# Patient Record
Sex: Female | Born: 1955
Health system: Southern US, Community
[De-identification: ages and names within clinical notes are randomized; demographics above are authoritative.]

## PROBLEM LIST (undated history)

## (undated) DIAGNOSIS — Z9889 Other specified postprocedural states: Secondary | ICD-10-CM

## (undated) DIAGNOSIS — G43909 Migraine, unspecified, not intractable, without status migrainosus: Secondary | ICD-10-CM

## (undated) DIAGNOSIS — I10 Essential (primary) hypertension: Secondary | ICD-10-CM

## (undated) DIAGNOSIS — R112 Nausea with vomiting, unspecified: Secondary | ICD-10-CM

## (undated) DIAGNOSIS — T8859XA Other complications of anesthesia, initial encounter: Secondary | ICD-10-CM

## (undated) HISTORY — PX: TOE SURGERY: SHX1073

---

## 2012-12-04 DIAGNOSIS — H02429 Myogenic ptosis of unspecified eyelid: Secondary | ICD-10-CM | POA: Insufficient documentation

## 2012-12-04 DIAGNOSIS — L719 Rosacea, unspecified: Secondary | ICD-10-CM | POA: Insufficient documentation

## 2012-12-04 DIAGNOSIS — H5340 Unspecified visual field defects: Secondary | ICD-10-CM | POA: Insufficient documentation

## 2012-12-04 DIAGNOSIS — L908 Other atrophic disorders of skin: Secondary | ICD-10-CM | POA: Insufficient documentation

## 2012-12-04 DIAGNOSIS — L988 Other specified disorders of the skin and subcutaneous tissue: Secondary | ICD-10-CM | POA: Insufficient documentation

## 2016-06-13 DIAGNOSIS — G43909 Migraine, unspecified, not intractable, without status migrainosus: Secondary | ICD-10-CM | POA: Insufficient documentation

## 2017-06-21 DIAGNOSIS — N952 Postmenopausal atrophic vaginitis: Secondary | ICD-10-CM | POA: Insufficient documentation

## 2018-07-16 MED FILL — SPIRONOLACTONE 100 MG TABS: 100 | 90 days supply | Qty: 90 | Fill #0

## 2018-07-16 MED FILL — METHYLPHENIDATE 10 MG TAB: 10 | 30 days supply | Qty: 90 | Fill #0

## 2018-07-16 MED FILL — SUMATRIPTAN SUCC 100 MG TAB: 100 | 30 days supply | Qty: 9 | Fill #0

## 2018-07-22 MED FILL — DESVENLAFAXINE SUC ER 50 MG: 50 | 60 days supply | Qty: 60 | Fill #0

## 2018-07-22 MED FILL — NORETHIN-ETH ESTRAD 1 MG-5: 1-5 | 84 days supply | Qty: 84 | Fill #0

## 2018-08-14 MED FILL — METHYLPHENIDATE 10 MG TAB: 10 | 30 days supply | Qty: 90 | Fill #0

## 2018-08-14 MED FILL — SUMATRIPTAN SUCC 100 MG TAB: 100 | 30 days supply | Qty: 9 | Fill #1

## 2018-08-27 MED FILL — AMLODIPINE BESYLATE 5 MG TA: 5 | 73 days supply | Qty: 73 | Fill #0

## 2018-09-17 MED FILL — METHYLPHENIDATE 10 MG TAB: 10 | 30 days supply | Qty: 90 | Fill #0

## 2018-09-17 MED FILL — SUMATRIPTAN SUCC 100 MG TAB: 100 | 30 days supply | Qty: 9 | Fill #2

## 2018-09-19 MED FILL — DESVENLAFAXINE SUC ER 50 MG: 50 | 13 days supply | Qty: 13 | Fill #1

## 2018-10-10 DIAGNOSIS — Z0000 Encounter for general adult medical examination without abnormal findings: Secondary | ICD-10-CM | POA: Diagnosis not present

## 2018-10-15 MED FILL — METHYLPHENIDATE 10 MG TAB: 10 | 30 days supply | Qty: 90 | Fill #0

## 2018-10-15 MED FILL — SPIRONOLACTONE 100 MG TABS: 100 | 90 days supply | Qty: 90 | Fill #1

## 2018-10-15 MED FILL — NORETHIN-ETH ESTRAD 1 MG-5: 1-5 | 84 days supply | Qty: 84 | Fill #1

## 2018-10-15 MED FILL — SUMATRIPTAN SUCC 100 MG TAB: 100 | 30 days supply | Qty: 9 | Fill #0

## 2018-10-19 MED FILL — DESVENLAFAXINE SUC ER 50 MG: 50 | 90 days supply | Qty: 90 | Fill #0

## 2018-11-17 MED FILL — AMLODIPINE BESYLATE 5 MG TA: 5 | 73 days supply | Qty: 73 | Fill #0

## 2018-11-17 MED FILL — METHYLPHENIDATE 10 MG TAB: 10 | 30 days supply | Qty: 90 | Fill #0

## 2018-11-17 MED FILL — SUMATRIPTAN SUCC 100 MG TAB: 100 | 20 days supply | Qty: 6 | Fill #1

## 2018-12-04 MED FILL — SUMAtriptan SUCCINATE 100 M: 100 | 90 days supply | Qty: 24 | Fill #0

## 2018-12-12 DIAGNOSIS — Z1231 Encounter for screening mammogram for malignant neoplasm of breast: Secondary | ICD-10-CM | POA: Diagnosis not present

## 2018-12-16 MED FILL — METHYLPHENIDATE 10 MG TAB: 10 | 30 days supply | Qty: 90 | Fill #0

## 2019-01-05 MED FILL — NORETHIN-ETH ESTRAD 1 MG-5: 1-5 | 84 days supply | Qty: 84 | Fill #2

## 2019-01-14 MED FILL — SPIRONOLACTONE 100 MG TAB: 100 | 90 days supply | Qty: 90 | Fill #2

## 2019-01-14 MED FILL — METHYLPHENIDATE 10 MG TAB: 10 | 30 days supply | Qty: 90 | Fill #0

## 2019-01-14 MED FILL — DESVENLAFAXINE SUC ER 50 MG: 50 | 90 days supply | Qty: 90 | Fill #0

## 2019-01-23 ENCOUNTER — Ambulatory Visit: Payer: 59 | Admitting: Podiatry

## 2019-01-23 ENCOUNTER — Encounter: Payer: Self-pay | Admitting: Podiatry

## 2019-01-23 ENCOUNTER — Ambulatory Visit (INDEPENDENT_AMBULATORY_CARE_PROVIDER_SITE_OTHER): Payer: 59

## 2019-01-23 ENCOUNTER — Other Ambulatory Visit: Payer: Self-pay | Admitting: Podiatry

## 2019-01-23 VITALS — BP 149/66

## 2019-01-23 DIAGNOSIS — I7300 Raynaud's syndrome without gangrene: Secondary | ICD-10-CM | POA: Diagnosis not present

## 2019-01-23 DIAGNOSIS — M79671 Pain in right foot: Secondary | ICD-10-CM

## 2019-01-23 DIAGNOSIS — H524 Presbyopia: Secondary | ICD-10-CM | POA: Diagnosis not present

## 2019-01-28 NOTE — Progress Notes (Signed)
Subjective:   Patient ID: Deborah York, female   DOB: 63 y.o.   MRN: [id number]   HPI Patient presents with a lot of pain of her right third toe and states is been relatively recently.  States the toe did turn quite red and that it was irritated and is not as bad now but still sore.  Patient does not smoke and likes to be active   Review of Systems  All other systems reviewed and are negative.       Objective:  Physical Exam Vitals signs and nursing note reviewed.  Constitutional:      Appearance: She is well-developed.  Pulmonary:     Effort: Pulmonary effort is normal.  Musculoskeletal: Normal range of motion.  Skin:    General: Skin is warm.  Neurological:     Mental Status: She is alert.     Neurovascular status intact muscle strength is adequate range of motion within normal limits with patient found to have discoloration and irritation distal third digit right that is localized with no breakdown of tissue noted currently there is mild coolness to the digits but no increased coolness in this digit over other digits and patient did have good Flow and was noted to be well oriented x3     Assessment:  Probability for ray nods phenomena right with probability for trauma and low-grade ischemia of the skin itself     Plan:  H&P and explained the condition to the patient and at this point I have recommended conservative protection for the area along with gradual warming of the affected area.  This should be uneventful but I did give strict instructions if any changes were to occur to let us know immediately

## 2019-02-12 MED FILL — AMLODIPINE BESYLATE 5 MG TA: 5 | 90 days supply | Qty: 90 | Fill #0

## 2019-02-12 MED FILL — METHYLPHENIDATE 10 MG TAB: 10 | 30 days supply | Qty: 90 | Fill #0

## 2019-03-02 MED FILL — SUMAtriptan SUCCINATE 100 M: 100 | 90 days supply | Qty: 24 | Fill #1

## 2019-03-18 MED FILL — METHYLPHENIDATE 10 MG TAB: 10 | 30 days supply | Qty: 90 | Fill #0

## 2019-03-18 MED FILL — NORETHIN-ETH ESTRAD 1 MG-5: 1-5 | 84 days supply | Qty: 84 | Fill #3

## 2019-04-17 MED FILL — DESVENLAFAXINE SUC ER 50 MG: 50 | 90 days supply | Qty: 90 | Fill #0

## 2019-04-17 MED FILL — METHYLPHENIDATE 10 MG TAB: 10 | 30 days supply | Qty: 90 | Fill #0

## 2019-04-17 MED FILL — SPIRONOLACTONE 100 MG TAB: 100 | 90 days supply | Qty: 90 | Fill #0

## 2019-05-18 MED FILL — AMLODIPINE BESYLATE 5 MG TA: 5 | 90 days supply | Qty: 90 | Fill #1

## 2019-05-18 MED FILL — METHYLPHENIDATE 10 MG TAB: 10 | 30 days supply | Qty: 90 | Fill #0

## 2019-05-29 MED FILL — SM BLOOD PRESSURE MONITOR: 30 days supply | Qty: 1 | Fill #0

## 2019-06-02 MED FILL — SUMATRIPTAN SUCC 100 MG TAB: 100 | 80 days supply | Qty: 24 | Fill #0

## 2019-06-17 MED FILL — NORETHIN-ETH ESTRAD 1 MG-5: 1-5 | 84 days supply | Qty: 84 | Fill #0

## 2019-06-18 MED FILL — METHYLPHENIDATE 10 MG TAB: 10 | 30 days supply | Qty: 90 | Fill #0

## 2019-07-16 MED FILL — DESVENLAFAXINE SUC ER 50 MG: 50 | 90 days supply | Qty: 90 | Fill #0

## 2019-07-16 MED FILL — SPIRONOLACTONE 100 MG TAB: 100 | 90 days supply | Qty: 90 | Fill #1

## 2019-07-22 DIAGNOSIS — L923 Foreign body granuloma of the skin and subcutaneous tissue: Secondary | ICD-10-CM | POA: Diagnosis not present

## 2019-07-24 MED FILL — METHYLPHENIDATE 10 MG TAB: 10 | 30 days supply | Qty: 90 | Fill #0

## 2019-07-29 DIAGNOSIS — R238 Other skin changes: Secondary | ICD-10-CM | POA: Diagnosis not present

## 2019-07-29 DIAGNOSIS — L814 Other melanin hyperpigmentation: Secondary | ICD-10-CM | POA: Diagnosis not present

## 2019-07-29 DIAGNOSIS — D225 Melanocytic nevi of trunk: Secondary | ICD-10-CM | POA: Diagnosis not present

## 2019-07-29 DIAGNOSIS — L821 Other seborrheic keratosis: Secondary | ICD-10-CM | POA: Diagnosis not present

## 2019-07-29 DIAGNOSIS — D229 Melanocytic nevi, unspecified: Secondary | ICD-10-CM | POA: Diagnosis not present

## 2019-07-29 DIAGNOSIS — L819 Disorder of pigmentation, unspecified: Secondary | ICD-10-CM | POA: Diagnosis not present

## 2019-07-29 DIAGNOSIS — L570 Actinic keratosis: Secondary | ICD-10-CM | POA: Diagnosis not present

## 2019-07-29 DIAGNOSIS — D485 Neoplasm of uncertain behavior of skin: Secondary | ICD-10-CM | POA: Diagnosis not present

## 2019-07-29 DIAGNOSIS — D1801 Hemangioma of skin and subcutaneous tissue: Secondary | ICD-10-CM | POA: Diagnosis not present

## 2019-08-14 DIAGNOSIS — F439 Reaction to severe stress, unspecified: Secondary | ICD-10-CM | POA: Diagnosis not present

## 2019-08-14 DIAGNOSIS — R195 Other fecal abnormalities: Secondary | ICD-10-CM | POA: Diagnosis not present

## 2019-08-14 MED FILL — METRONIDAZOLE 500 MG TABS: 500 | 14 days supply | Qty: 42 | Fill #0

## 2019-08-20 MED FILL — AMLODIPINE BESYLATE 5 MG TA: 5 | 90 days supply | Qty: 90 | Fill #0

## 2019-08-20 MED FILL — METHYLPHENIDATE 10 MG TAB: 10 | 30 days supply | Qty: 90 | Fill #0

## 2019-08-21 DIAGNOSIS — F4323 Adjustment disorder with mixed anxiety and depressed mood: Secondary | ICD-10-CM | POA: Diagnosis not present

## 2019-09-02 MED FILL — SUMATRIPTAN SUCC 100 MG TAB: 100 | 80 days supply | Qty: 24 | Fill #1

## 2019-09-04 DIAGNOSIS — F4323 Adjustment disorder with mixed anxiety and depressed mood: Secondary | ICD-10-CM | POA: Diagnosis not present

## 2019-09-18 DIAGNOSIS — F4323 Adjustment disorder with mixed anxiety and depressed mood: Secondary | ICD-10-CM | POA: Diagnosis not present

## 2019-09-18 MED FILL — METHYLPHENIDATE 10 MG TAB: 10 | 30 days supply | Qty: 90 | Fill #0

## 2019-10-01 DIAGNOSIS — R195 Other fecal abnormalities: Secondary | ICD-10-CM | POA: Diagnosis not present

## 2019-10-02 DIAGNOSIS — F4323 Adjustment disorder with mixed anxiety and depressed mood: Secondary | ICD-10-CM | POA: Diagnosis not present

## 2019-10-09 DIAGNOSIS — K449 Diaphragmatic hernia without obstruction or gangrene: Secondary | ICD-10-CM | POA: Diagnosis not present

## 2019-10-09 DIAGNOSIS — R194 Change in bowel habit: Secondary | ICD-10-CM | POA: Diagnosis not present

## 2019-10-09 DIAGNOSIS — R197 Diarrhea, unspecified: Secondary | ICD-10-CM | POA: Diagnosis not present

## 2019-10-12 MED FILL — NORETHIN-ETH ESTRAD 1 MG-5: 1-5 | 84 days supply | Qty: 84 | Fill #1

## 2019-10-19 DIAGNOSIS — R197 Diarrhea, unspecified: Secondary | ICD-10-CM | POA: Diagnosis not present

## 2019-10-23 DIAGNOSIS — Z23 Encounter for immunization: Secondary | ICD-10-CM | POA: Diagnosis not present

## 2019-10-23 DIAGNOSIS — Z0000 Encounter for general adult medical examination without abnormal findings: Secondary | ICD-10-CM | POA: Diagnosis not present

## 2019-10-23 DIAGNOSIS — F4323 Adjustment disorder with mixed anxiety and depressed mood: Secondary | ICD-10-CM | POA: Diagnosis not present

## 2019-10-23 MED FILL — GABAPENTIN 600 MG TABLET: 600 | 90 days supply | Qty: 90 | Fill #0

## 2019-11-18 DIAGNOSIS — Z23 Encounter for immunization: Secondary | ICD-10-CM | POA: Diagnosis not present

## 2019-11-18 MED FILL — METHYLPHENIDATE HCL 10 MG T: 10 | 30 days supply | Qty: 90 | Fill #0

## 2019-11-27 DIAGNOSIS — F4323 Adjustment disorder with mixed anxiety and depressed mood: Secondary | ICD-10-CM | POA: Diagnosis not present

## 2019-12-02 MED FILL — SUMATRIPTAN SUCC 100 MG TAB: 100 | 90 days supply | Qty: 24 | Fill #0

## 2019-12-04 DIAGNOSIS — R198 Other specified symptoms and signs involving the digestive system and abdomen: Secondary | ICD-10-CM | POA: Diagnosis not present

## 2019-12-04 DIAGNOSIS — K581 Irritable bowel syndrome with constipation: Secondary | ICD-10-CM | POA: Diagnosis not present

## 2019-12-04 DIAGNOSIS — K449 Diaphragmatic hernia without obstruction or gangrene: Secondary | ICD-10-CM | POA: Diagnosis not present

## 2019-12-04 MED FILL — LINZESS 145 MCG CAPSULE: 145 | 90 days supply | Qty: 90 | Fill #0

## 2019-12-17 MED FILL — METHYLPHENIDATE HCL 10 MG T: 10 | 30 days supply | Qty: 90 | Fill #0

## 2019-12-18 DIAGNOSIS — F4323 Adjustment disorder with mixed anxiety and depressed mood: Secondary | ICD-10-CM | POA: Diagnosis not present

## 2020-01-01 DIAGNOSIS — F4323 Adjustment disorder with mixed anxiety and depressed mood: Secondary | ICD-10-CM | POA: Diagnosis not present

## 2020-01-08 ENCOUNTER — Other Ambulatory Visit: Payer: Self-pay

## 2020-01-08 ENCOUNTER — Ambulatory Visit: Payer: 59 | Admitting: Podiatry

## 2020-01-08 ENCOUNTER — Ambulatory Visit (INDEPENDENT_AMBULATORY_CARE_PROVIDER_SITE_OTHER): Payer: 59

## 2020-01-08 VITALS — Temp 97.2°F

## 2020-01-08 DIAGNOSIS — G5762 Lesion of plantar nerve, left lower limb: Secondary | ICD-10-CM | POA: Diagnosis not present

## 2020-01-08 DIAGNOSIS — G5763 Lesion of plantar nerve, bilateral lower limbs: Secondary | ICD-10-CM

## 2020-01-08 DIAGNOSIS — D3610 Benign neoplasm of peripheral nerves and autonomic nervous system, unspecified: Secondary | ICD-10-CM

## 2020-01-11 MED FILL — NORETHIN-ETH ESTRAD 1 MG-5: 1-5 | 84 days supply | Qty: 84 | Fill #2

## 2020-01-14 MED FILL — DESVENLAFAXINE SUC ER 50 MG: 50 | 90 days supply | Qty: 90 | Fill #0

## 2020-01-14 MED FILL — SPIRONOLACTONE 100 MG TAB: 100 | 90 days supply | Qty: 90 | Fill #0

## 2020-01-15 DIAGNOSIS — F4323 Adjustment disorder with mixed anxiety and depressed mood: Secondary | ICD-10-CM | POA: Diagnosis not present

## 2020-01-15 MED FILL — METHYLPHENIDATE 10 MG TAB: 10 | 30 days supply | Qty: 90 | Fill #0

## 2020-01-26 NOTE — Progress Notes (Signed)
  Subjective:  Patient ID: Deborah York, female    DOB: 24-Jan-1956,  MRN: [id number]  Chief Complaint  Patient presents with  . Neuroma    L ft, 4th toe. x18-20 months. Pt stated, "I saw a doctor at [facility] for this issue, and he diagnosed me with a Morton's neuroma. I have 2/10 pain while walking, and 5/10 shooting pains. I received injections at [facility], and they helped for awhile. I also apply padding [plantar forefoot submet 4".    64 y.o. female presents with the above complaint. History confirmed with patient.   Objective:  Physical Exam: warm, good capillary refill, no trophic changes or ulcerative lesions, normal DP and PT pulses and normal sensory exam. Left Foot: tenderness between the 3rd and 4th metatarsal head Mulder's click noted Right Foot: tenderness between the 3rd and 4th metatarsal head Mulder's click noted.  No images are attached to the encounter.  Radiographs: X-ray of the left foot: no fracture, dislocation, swelling or degenerative changes noted   Assessment:   1. Morton's neuroma of both feet    Plan:  Patient was evaluated and treated and all questions answered.  Morton Neuroma -Educated on etiology -XR reviewed with patient -Injection delivered to the affected interspaces  Procedure: Neuroma Injection Location: Bilateral 3rd interspace Skin Prep: Alcohol. Injectate: 0.5 cc 0.5% marcaine plain, 0.5 cc celestone Disposition: Patient tolerated procedure well. Injection site dressed with a band-aid.  Return in about 3 weeks (around 01/29/2020) for Neuroma, Bilateral.

## 2020-01-27 DIAGNOSIS — L249 Irritant contact dermatitis, unspecified cause: Secondary | ICD-10-CM | POA: Diagnosis not present

## 2020-01-27 DIAGNOSIS — L570 Actinic keratosis: Secondary | ICD-10-CM | POA: Diagnosis not present

## 2020-01-27 MED FILL — TRIAMCINOLONE 0.1% CREAM: 0.1 | 7 days supply | Qty: 15 | Fill #0

## 2020-01-29 ENCOUNTER — Ambulatory Visit (INDEPENDENT_AMBULATORY_CARE_PROVIDER_SITE_OTHER): Payer: 59 | Admitting: Podiatry

## 2020-01-29 ENCOUNTER — Other Ambulatory Visit: Payer: Self-pay

## 2020-01-29 DIAGNOSIS — G5763 Lesion of plantar nerve, bilateral lower limbs: Secondary | ICD-10-CM | POA: Diagnosis not present

## 2020-01-29 DIAGNOSIS — F4323 Adjustment disorder with mixed anxiety and depressed mood: Secondary | ICD-10-CM | POA: Diagnosis not present

## 2020-01-29 DIAGNOSIS — F909 Attention-deficit hyperactivity disorder, unspecified type: Secondary | ICD-10-CM | POA: Insufficient documentation

## 2020-01-29 DIAGNOSIS — I10 Essential (primary) hypertension: Secondary | ICD-10-CM | POA: Insufficient documentation

## 2020-01-29 NOTE — Progress Notes (Signed)
  Subjective:  Patient ID: Deborah York, female    DOB: August 19, 1956,  MRN: [id number]  Chief Complaint  Patient presents with  . Neuroma    Pt states right foot is feeling good, left is still painful. Pt states injections have been helpful.    64 y.o. female presents with the above complaint. History confirmed with patient.   Objective:  Physical Exam: warm, good capillary refill, no trophic changes or ulcerative lesions, normal DP and PT pulses and normal sensory exam. Left Foot: tenderness between the 3rd and 4th metatarsal head Mulder's click noted Right Foot: no tenderness between the 3rd and 4th metatarsal head    Assessment:   1. Morton's neuroma of both feet    Plan:  Patient was evaluated and treated and all questions answered.  Morton Neuroma -Repeat injection left. Right improved   Procedure: Neuroma Injection Location: Left 3rd interspace Skin Prep: Alcohol. Injectate: 0.5 cc 0.5% marcaine plain, 0.5 cc celestone  Disposition: Patient tolerated procedure well. Injection site dressed with a band-aid.   Return if symptoms worsen or fail to improve.

## 2020-02-05 MED FILL — SHINGRIX 50 MCG SUS: 50 | 1 days supply | Qty: 1 | Fill #0

## 2020-02-18 MED FILL — METHYLPHENIDATE HCL 10 MG T: 10 | 30 days supply | Qty: 90 | Fill #0

## 2020-02-26 ENCOUNTER — Other Ambulatory Visit: Payer: Self-pay

## 2020-02-26 ENCOUNTER — Encounter: Payer: Self-pay | Admitting: Emergency Medicine

## 2020-02-26 ENCOUNTER — Emergency Department (INDEPENDENT_AMBULATORY_CARE_PROVIDER_SITE_OTHER): Payer: 59

## 2020-02-26 ENCOUNTER — Emergency Department (INDEPENDENT_AMBULATORY_CARE_PROVIDER_SITE_OTHER)
Admission: EM | Admit: 2020-02-26 | Discharge: 2020-02-26 | Disposition: A | Discharge status: Home / Self Care | Payer: 59 | Source: Home / Self Care | Attending: Family Medicine | Admitting: Family Medicine

## 2020-02-26 DIAGNOSIS — S42291A Other displaced fracture of upper end of right humerus, initial encounter for closed fracture: Secondary | ICD-10-CM

## 2020-02-26 DIAGNOSIS — Z800 Family history of malignant neoplasm of digestive organs: Secondary | ICD-10-CM | POA: Diagnosis not present

## 2020-02-26 DIAGNOSIS — K581 Irritable bowel syndrome with constipation: Secondary | ICD-10-CM | POA: Diagnosis not present

## 2020-02-26 DIAGNOSIS — M25511 Pain in right shoulder: Secondary | ICD-10-CM | POA: Diagnosis not present

## 2020-02-26 DIAGNOSIS — S42201A Unspecified fracture of upper end of right humerus, initial encounter for closed fracture: Secondary | ICD-10-CM | POA: Diagnosis not present

## 2020-02-26 DIAGNOSIS — F4323 Adjustment disorder with mixed anxiety and depressed mood: Secondary | ICD-10-CM | POA: Diagnosis not present

## 2020-02-26 HISTORY — DX: Essential (primary) hypertension: I10

## 2020-02-26 HISTORY — DX: Migraine, unspecified, not intractable, without status migrainosus: G43.909

## 2020-02-26 MED FILL — LINZESS 290 MCG CAPSULE: 290 | 90 days supply | Qty: 90 | Fill #0

## 2020-02-26 NOTE — ED Notes (Signed)
Pt's husband updated in lobby per pt request. Waiting on Dr T to see pt per request to Dr Assunta Found. Imaging results hand delivered to Dr. Darene Lamer by Lacretia Nicks.

## 2020-02-26 NOTE — ED Triage Notes (Signed)
Appointment Dr T, Friday, 03/04/2020 10:50, Gave patient appointment card

## 2020-02-26 NOTE — Discharge Instructions (Signed)
Wear sling.  Apply ice pack for 20 to 30 minutes, 3 to 4 times daily  Continue until pain and swelling decrease.  May take Tylenol as needed for pain

## 2020-02-26 NOTE — ED Triage Notes (Signed)
Fell last night, tripped over something in floor landed on RT shoulder and upper arm on concrete.

## 2020-02-26 NOTE — ED Provider Notes (Signed)
[facility]    CSN: [id number] Arrival date & time: 02/26/20  [id number]      History   Chief Complaint Chief Complaint  Patient presents with  . Fall    HPI Deborah York is a 64 y.o. female.   Patient fell in [place] last night, landing on her right shoulder.  She has persistent pain and minimal range of motion of her right shoulder.  She denies loss of consciousness or other injury.  The history is provided by the patient.  Shoulder Injury This is a new problem. The current episode started yesterday. The problem has not changed since onset.Pertinent negatives include no chest pain and no shortness of breath. Exacerbated by: Right shoulder movement. Nothing relieves the symptoms. She has tried nothing for the symptoms.    Past Medical History:  Diagnosis Date  . Hypertension   . Migraines     Patient Active Problem List   Diagnosis Date Noted  . ADHD (attention deficit hyperactivity disorder) 01/29/2020  . Hypertension 01/29/2020  . Atrophic vaginitis 06/21/2017  . Migraine headache 06/13/2016  . Age-related facial wrinkles 12/04/2012  . Other specified hypertrophic and atrophic condition of skin 12/04/2012  . 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 Rosacea 12/04/2012  . Visual field defect 12/04/2012    Past Surgical History:  Procedure Laterality Date  . TOE SURGERY      OB History   No obstetric history on file.      Home Medications    Prior to Admission medications   Medication Sig Start Date End Date Taking? Authorizing Provider  amLODipine (NORVASC) 5 MG tablet  11/17/18   [provider]  cholecalciferol (VITAMIN D) 25 MCG (1000 UT) tablet Take by mouth.    [provider]  desvenlafaxine (PRISTIQ) 50 MG 24 hr tablet  01/14/19   [provider]  estradiol (ESTRACE) 0.1 MG/GM vaginal cream Apply intravaginally qhs x 1 week then 2-3 nights weekly 06/21/17   [provider]  gabapentin (NEURONTIN) 600 MG tablet   10/23/19   [provider]  Rolan Lipa 145 MCG CAPS capsule  01/27/20   [provider]  methylphenidate (RITALIN) 10 MG tablet  01/14/19   [provider]  Multiple Vitamin (MULTI-VITAMIN) tablet Take by mouth.    [provider]  Multiple Vitamins-Minerals (MULTIVITAMIN ADULT) TABS Take by mouth.    [provider]  norethindrone-ethinyl estradiol (FEMHRT 1/5) 1-5 MG-MCG TABS tablet  01/27/20   [provider]  spironolactone (ALDACTONE) 100 MG tablet  01/14/19   [provider]  SUMAtriptan (IMITREX) 100 MG tablet  12/04/18   [provider]  tretinoin (RETIN-A) 0.1 % cream APPLY SPARINGLY TO AFFECTED AREA(S) ONCE DAILY AT BEDTIME. 01/26/18   [provider]  triamcinolone cream (KENALOG) 0.1 % SMARTSIG:1 Sparingly Topical Twice Daily PRN 01/27/20   [provider]    Family History Family History  Problem Relation Age of Onset  . Cancer Mother   . Heart failure Father     Social History Social History   Tobacco Use  . Smoking status: Former Research scientist (life sciences)  . Smokeless tobacco: Former Network engineer Use Topics  . Alcohol use: Yes  . Drug use: Never     Allergies   Sulfa antibiotics   Review of Systems Review of Systems  Respiratory: Negative for shortness of breath.   Cardiovascular: Negative for chest pain.  Musculoskeletal:       Right shoulder pain  All other systems reviewed and  are negative.    Physical Exam Triage Vital Signs ED Triage Vitals  Enc Vitals Group     BP 02/26/20 0943 115/65     Pulse Rate 02/26/20 0943 92     Resp --      Temp 02/26/20 0943 98.7 F (37.1 C)     Temp Source 02/26/20 0943 Oral     SpO2 02/26/20 0943 100 %     Weight 02/26/20 0945 128 lb (58.1 kg)     Height 02/26/20 0945 5\' 7"  (1.702 m)     Head Circumference --      Peak Flow --      Pain Score 02/26/20 0944 7     Pain Loc --      Pain Edu? --      Excl. in [place]? --    No data found.  Updated  Vital Signs BP 115/65 (BP Location: Right Arm)   Pulse 92   Temp 98.7 F (37.1 C) (Oral)   Ht 5\' 7"  (1.702 m)   Wt 58.1 kg   SpO2 100%   BMI 20.05 kg/m   Visual Acuity Right Eye Distance:   Left Eye Distance:   Bilateral Distance:    Right Eye Near:   Left Eye Near:    Bilateral Near:     Physical Exam Vitals and nursing note reviewed.  Constitutional:      General: She is not in acute distress. HENT:     Head: Atraumatic.  Eyes:     Pupils: Pupils are equal, round, and reactive to light.  Cardiovascular:     Heart sounds: Normal heart sounds.  Pulmonary:     Breath sounds: Normal breath sounds.  Musculoskeletal:     Right shoulder: Tenderness and bony tenderness present. Decreased range of motion. Normal pulse.       Arms:     Cervical back: No tenderness.     Comments: Tenderness right shoulder; minimal range of motion.  Distal neurovascular function is intact.   Skin:    General: Skin is warm and dry.  Neurological:     Mental Status: She is alert.      UC Treatments / Results  Labs (all labs ordered are listed, but only abnormal results are displayed) Labs Reviewed - No data to display  EKG   Radiology DG Shoulder Right  Result Date: 02/26/2020 CLINICAL DATA:  [place]. Right shoulder pain and limited range of motion. EXAM: RIGHT SHOULDER - 2+ VIEW COMPARISON:  None. FINDINGS: Relatively nondisplaced fractures of the humeral head and neck. Mild comminution of the greater tuberosity fracture. The glenohumeral joint is maintained. The [facility] joint is intact. The visualized right ribs appear normal and the right lung is grossly clear. IMPRESSION: Relatively nondisplaced fractures of the right humeral head and neck. Electronically Signed   By: Marijo Sanes M.D.   On: 02/26/2020 10:33    Procedures Procedures (including critical care time)  Medications Ordered in UC Medications - No data to display  Initial Impression / Assessment and Plan / UC Course  I have  reviewed the triage vital signs and the nursing notes.  Pertinent labs & imaging results that were available during my care of the patient were reviewed by me and considered in my medical decision making (see chart for details).    Dispensed sling. Referred to Dr. Aundria Mems for fracture management and follow-up. Recommend checking Vitamin D 25-OH level.   Final Clinical Impressions(s) / UC Diagnoses   Final diagnoses:  Fracture of humeral head, closed, right, initial encounter     Discharge Instructions     Wear sling.  Apply ice pack for 20 to 30 minutes, 3 to 4 times daily  Continue until pain and swelling decrease.  May take Tylenol as needed for pain    ED Prescriptions    None        Kandra Nicolas, MD 02/26/20 1130

## 2020-03-03 MED FILL — SUMATRIPTAN SUCC 100 MG TAB: 100 | 90 days supply | Qty: 24 | Fill #1

## 2020-03-04 ENCOUNTER — Encounter: Payer: Self-pay | Admitting: Sports Medicine

## 2020-03-04 ENCOUNTER — Ambulatory Visit (INDEPENDENT_AMBULATORY_CARE_PROVIDER_SITE_OTHER): Payer: 59 | Admitting: Sports Medicine

## 2020-03-04 ENCOUNTER — Ambulatory Visit (INDEPENDENT_AMBULATORY_CARE_PROVIDER_SITE_OTHER): Payer: 59

## 2020-03-04 ENCOUNTER — Other Ambulatory Visit: Payer: Self-pay

## 2020-03-04 DIAGNOSIS — S42291A Other displaced fracture of upper end of right humerus, initial encounter for closed fracture: Secondary | ICD-10-CM

## 2020-03-04 DIAGNOSIS — Z13820 Encounter for screening for osteoporosis: Secondary | ICD-10-CM | POA: Diagnosis not present

## 2020-03-04 DIAGNOSIS — H52223 Regular astigmatism, bilateral: Secondary | ICD-10-CM | POA: Diagnosis not present

## 2020-03-04 DIAGNOSIS — H524 Presbyopia: Secondary | ICD-10-CM | POA: Diagnosis not present

## 2020-03-04 DIAGNOSIS — H5201 Hypermetropia, right eye: Secondary | ICD-10-CM | POA: Diagnosis not present

## 2020-03-04 MED ORDER — CALCIUM CARBONATE-VITAMIN D 600-400 MG-UNIT PO TABS: 1.0000 | ORAL_TABLET | Freq: Two times a day (BID) | ORAL | 11 refills | Status: DC

## 2020-03-04 MED FILL — CALCIUM CARBONATE-VITAMIN D: 600-400 | 30 days supply | Qty: 60 | Fill #0

## 2020-03-04 NOTE — Progress Notes (Addendum)
    Procedures performed today:    None.  Independent interpretation of notes and tests performed by another provider:   I personally reviewed her x-rays, they do show comminution of the humeral head and greater tuberosity but overall no displacement.  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 Ultimately x-rays showed a comminuted fracture of her humeral head. She was appropriately placed in a sling and referred to me for further evaluation and definitive treatment. She declines any pain medications, she has good control of her pain in the sling. I explained we would probably do another 3 weeks in the sling considering comminution, repeat x-rays today. Afterwards I would get her out of the sling and start physical therapy with gentle range of motion. If after 4 to 6 weeks of therapy she is not getting her strength back we will do a high-volume hydrodistention. Return to see me in 3 weeks.    ___________________________________________ Gwen Her. Dianah Field, M.D., ABFM., CAQSM. Primary Care and [place] Instructor of [place] of [facility] of Medicine

## 2020-03-04 NOTE — Assessment & Plan Note (Signed)
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 Ultimately x-rays showed a comminuted fracture of her humeral head. She was appropriately placed in a sling and referred to me for further evaluation and definitive treatment. She declines any pain medications, she has good control of her pain in the sling. I explained we would probably do another 3 weeks in the sling considering comminution, repeat x-rays today. Afterwards I would get her out of the sling and start physical therapy with gentle range of motion. If after 4 to 6 weeks of therapy she is not getting her strength back we will do a high-volume hydrodistention. Return to see me in 3 weeks.

## 2020-03-09 MED FILL — ESTRADIOL 0.1 MG/GM CRM: 0.1 | 30 days supply | Qty: 43 | Fill #0

## 2020-03-16 ENCOUNTER — Other Ambulatory Visit: Payer: Self-pay

## 2020-03-16 ENCOUNTER — Ambulatory Visit (INDEPENDENT_AMBULATORY_CARE_PROVIDER_SITE_OTHER): Payer: 59

## 2020-03-16 DIAGNOSIS — M8589 Other specified disorders of bone density and structure, multiple sites: Secondary | ICD-10-CM | POA: Diagnosis not present

## 2020-03-16 DIAGNOSIS — Z13820 Encounter for screening for osteoporosis: Secondary | ICD-10-CM | POA: Diagnosis not present

## 2020-03-16 DIAGNOSIS — Z780 Asymptomatic menopausal state: Secondary | ICD-10-CM | POA: Diagnosis not present

## 2020-03-17 MED FILL — METHYLPHENIDATE 10 MG TAB: 10 | 30 days supply | Qty: 90 | Fill #0

## 2020-03-25 ENCOUNTER — Other Ambulatory Visit: Payer: Self-pay

## 2020-03-25 ENCOUNTER — Ambulatory Visit (INDEPENDENT_AMBULATORY_CARE_PROVIDER_SITE_OTHER): Payer: 59 | Admitting: Sports Medicine

## 2020-03-25 ENCOUNTER — Ambulatory Visit (INDEPENDENT_AMBULATORY_CARE_PROVIDER_SITE_OTHER): Payer: 59

## 2020-03-25 DIAGNOSIS — S42291A Other displaced fracture of upper end of right humerus, initial encounter for closed fracture: Secondary | ICD-10-CM | POA: Diagnosis not present

## 2020-03-25 DIAGNOSIS — S42291D Other displaced fracture of upper end of right humerus, subsequent encounter for fracture with routine healing: Secondary | ICD-10-CM | POA: Diagnosis not present

## 2020-03-25 DIAGNOSIS — M79641 Pain in right hand: Secondary | ICD-10-CM

## 2020-03-25 DIAGNOSIS — M19041 Primary osteoarthritis, right hand: Secondary | ICD-10-CM | POA: Diagnosis not present

## 2020-03-25 DIAGNOSIS — S42351A Displaced comminuted fracture of shaft of humerus, right arm, initial encounter for closed fracture: Secondary | ICD-10-CM | POA: Diagnosis not present

## 2020-03-25 NOTE — Assessment & Plan Note (Signed)
Pain of thumb basal when after a fall, no pain at the snuffbox. Adding some x-rays, this will probably be treatable with simple Tylenol and/or ibuprofen.

## 2020-03-25 NOTE — Assessment & Plan Note (Signed)
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 Today we will discontinue the sling, get updated x-rays and [facility]. I would like to see her back in 1 month.

## 2020-03-25 NOTE — Progress Notes (Addendum)
    Procedures performed today:    None.  Independent interpretation of notes and tests performed by another provider:   None.  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 Today we will discontinue the sling, get updated x-rays and [facility]. I would like to see her back in 1 month.  Right hand pain Pain of thumb basal when after a fall, no pain at the snuffbox. Adding some x-rays, this will probably be treatable with simple Tylenol and/or ibuprofen.    ___________________________________________ Deborah Her. Dianah Field, M.D., ABFM., CAQSM. Primary Care and [place] Instructor of [place] of [facility] of Medicine

## 2020-04-08 ENCOUNTER — Encounter: Payer: Self-pay | Admitting: Physical Therapy

## 2020-04-08 ENCOUNTER — Other Ambulatory Visit: Payer: Self-pay

## 2020-04-08 ENCOUNTER — Ambulatory Visit: Payer: 59 | Attending: Sports Medicine | Admitting: Physical Therapy

## 2020-04-08 ENCOUNTER — Other Ambulatory Visit (HOSPITAL_BASED_OUTPATIENT_CLINIC_OR_DEPARTMENT_OTHER): Payer: Self-pay | Admitting: Family Medicine

## 2020-04-08 DIAGNOSIS — M25611 Stiffness of right shoulder, not elsewhere classified: Secondary | ICD-10-CM | POA: Diagnosis present

## 2020-04-08 DIAGNOSIS — M8580 Other specified disorders of bone density and structure, unspecified site: Secondary | ICD-10-CM | POA: Diagnosis not present

## 2020-04-08 DIAGNOSIS — M25511 Pain in right shoulder: Secondary | ICD-10-CM | POA: Diagnosis present

## 2020-04-08 DIAGNOSIS — N952 Postmenopausal atrophic vaginitis: Secondary | ICD-10-CM | POA: Diagnosis not present

## 2020-04-08 DIAGNOSIS — M6281 Muscle weakness (generalized): Secondary | ICD-10-CM | POA: Insufficient documentation

## 2020-04-08 MED FILL — INTRAROSA 6.5 MG VAG INSERT: 6.5 | 28 days supply | Qty: 28 | Fill #0

## 2020-04-08 NOTE — Therapy (Signed)
[place], [place], [address] Phone: [phone]   Fax:  [phone]  Physical Therapy Treatment  Patient Details  Name: Deborah York MRN: [id number] Date of Birth: 09-01-1956 Referring Provider (PT): Aundria Mems, MD   Encounter Date: 04/08/2020  PT End of Session - 04/08/20 0757    Visit Number  1    Number of Visits  13    Date for PT Re-Evaluation  05/20/20    Authorization Type  [facility]    PT Start Time  0715    PT Stop Time  0757    PT Time Calculation (min)  42 min    Activity Tolerance  Patient tolerated treatment well    Behavior During Therapy  [facility] for tasks assessed/performed       Past Medical History:  Diagnosis Date  . Hypertension   . Migraines     Past Surgical History:  Procedure Laterality Date  . TOE SURGERY      There were no vitals filed for this visit.  Subjective Assessment - 04/08/20 0717    Subjective  6 weeks fell directly on Rt shoulder resulting in fracture. I have been out of sling 2 weeks. Just feels very stiff. Feel like I am getting some strength back. A little radiating pain into Rt thumb occasionally.    Patient Stated Goals  ROM, yard work- dirt/mulch bags, decrease pain, exercise- free weights, assist husband post CVA    Currently in Pain?  Yes    Pain Score  2     Pain Location  Shoulder    Pain Orientation  Right    Pain Descriptors / Indicators  --   stiff   Pain Radiating Towards  Rt thumb occasionally    Aggravating Factors   too much writing, fatigued by end of day    Pain Relieving Factors  heat         [facility] PT Assessment - 04/08/20 0001      Assessment   Medical Diagnosis  s/p Rt proximal humerus fx    Referring Provider (PT)  Aundria Mems, MD    Onset Date/Surgical Date  02/26/20    Hand Dominance  Right    Prior Therapy  no      Precautions   Precautions  None      Restrictions   Weight Bearing Restrictions  No      Balance Screen   Has the patient fallen in the past 6 months  Yes    How many times?  1    Has the patient had a decrease in activity level because of a fear of falling?   Yes    Is the patient reluctant to leave their home because of a fear of falling?   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      Cognition   Overall Cognitive Status  Within Functional Limits for tasks assessed      Observation/Other Assessments   Focus on Therapeutic Outcomes (FOTO)   60% limited      Sensation   Additional Comments  occasional tingling into Rt thumb      Posture/Postural Control   Posture Comments  upright posture with minimal guarding of Rt UE  ROM / Strength   AROM / PROM / Strength  AROM;PROM;Strength      PROM   Overall PROM Comments  limited by pain    PROM Assessment Site  Shoulder    Right/Left Shoulder  Right    Right Shoulder Flexion  90 Degrees    Right Shoulder ABduction  80 Degrees    Right Shoulder Internal Rotation  --   to belly at 0 abd   Right Shoulder External Rotation  30 Degrees   at 0 abd     Strength   Strength Assessment Site  Hand    Right/Left hand  Right;Left    Right Hand Grip (lbs)  40    Left Hand Grip (lbs)  50      Palpation   Palpation comment  mild TTP proximal humerus under coracoid process                    [facility] Adult PT Treatment/Exercise - 04/08/20 0001      Exercises   Exercises  Other Exercises    Other Exercises   see scanned instructions             PT Education - 04/08/20 1137    Education Details  anatomy of condition, POC, HEP, exercise form/rationale    Person(s) Educated  Patient    Methods  Explanation;Demonstration;Tactile cues;Verbal cues;Handout    Comprehension  Verbalized understanding;Returned  demonstration;Verbal cues required;Tactile cues required;Need further instruction       PT Short Term Goals - 04/08/20 1140      PT SHORT TERM GOAL #1   Title  Pt will demo AROM without shoulder elevation    Baseline  <90 deg available at eval    Time  3    Period  Weeks    Status  New    Target Date  04/29/20      PT SHORT TERM GOAL #2   Title  full PROM available    Baseline  limited to 90 in flx & abd due to pain at eval    Time  3    Period  Weeks    Status  New    Target Date  04/29/20        PT Long Term Goals - 04/08/20 1141      PT LONG TERM GOAL #1   Title  Pt will be independent with rest breaks and postural adjustments during long days at work    Baseline  began discussing at eval, pain with documentation posture    Time  6    Period  Weeks    Status  New    Target Date  05/20/20      PT LONG TERM GOAL #2   Title  Pt will be able to lift light weights in all ranges to begin return to long term UE exercise program    Baseline  unable at eval    Time  6    Period  Weeks    Status  New    Target Date  05/20/20      PT LONG TERM GOAL #3   Title  Pt will be able to begin yard work again    Baseline  avoiding at eval due to pain    Time  6    Period  Weeks    Status  New    Target Date  05/20/20      PT LONG TERM GOAL #4  Title  pt will be able to complete household chores and care activities for husband without being limited by pain    Baseline  limited by pain at eval    Time  6    Period  Weeks    Status  New    Target Date  05/20/20            Plan - 04/08/20 0757    Clinical Impression Statement  Pt presents to PT 6 weeks s/p Rt proximal humerus fracture. MD requests PROM for 1 week, AROM 1 week and then strengthening 2 weeks. She was able to tolerate PROM to 90 in flexion and abd with empty, painful end feel. HEP provided to perform PROM at home. Pt will benefit from skilled PT in order to decrease pain and meet functional goals.     Personal Factors and Comorbidities  Time since onset of injury/illness/exacerbation    Examination-Activity Limitations  Bathing;Lift;Reach Overhead;Carry;Caring for Others;Other    Examination-Participation Restrictions  Yard Work;Meal Prep;Cleaning;Driving    Stability/Clinical Decision Making  Stable/Uncomplicated    Clinical Decision Making  Low    Rehab Potential  Good    PT Frequency  2x / week    PT Duration  6 weeks    PT Treatment/Interventions  ADLs/Self Care Home Management;Cryotherapy;Electrical Stimulation;Moist Heat;Iontophoresis 4mg /ml Dexamethasone;Therapeutic activities;Therapeutic exercise;Patient/family education;Neuromuscular re-education;Manual techniques;Taping;Dry needling;Passive range of motion    PT Next Visit Plan  continue PROM week 1, AROM week 2, strengthening week 3-4 & then update MD    PT Home Exercise Plan  passive supine flx & ER/IR, scap retraction, upper trap & levator stretch, pendulums    Consulted and Agree with Plan of Care  Patient       Patient will benefit from skilled therapeutic intervention in order to improve the following deficits and impairments:  Pain, Postural dysfunction, Increased muscle spasms, Decreased activity tolerance, Decreased range of motion, Decreased strength, Impaired flexibility  Visit Diagnosis: Acute pain of right shoulder - Plan: PT plan of care cert/re-cert  Stiffness of right shoulder, not elsewhere classified - Plan: PT plan of care cert/re-cert  Muscle weakness (generalized) - Plan: PT plan of care cert/re-cert     Problem List Patient Active Problem List   Diagnosis Date Noted  . Right hand pain 03/25/2020  . Fracture of humeral head, right, closed 03/04/2020  . ADHD (attention deficit hyperactivity disorder) 01/29/2020  . Hypertension 01/29/2020  . Atrophic vaginitis 06/21/2017  . Migraine headache 06/13/2016  . Age-related facial wrinkles 12/04/2012  . Other specified hypertrophic and atrophic condition  of skin 12/04/2012  . 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 Rosacea 12/04/2012  . Visual field defect 12/04/2012    Jessica C. Hightower PT, DPT 04/08/20 12:40 PM   [place], [place], [address] Phone: [phone]   Fax:  [phone]  Name: Deborah York MRN: [id number] Date of Birth: 06-Jan-1956

## 2020-04-11 ENCOUNTER — Encounter: Payer: Self-pay | Admitting: Physical Therapy

## 2020-04-11 ENCOUNTER — Other Ambulatory Visit: Payer: Self-pay

## 2020-04-11 ENCOUNTER — Ambulatory Visit: Payer: 59 | Admitting: Physical Therapy

## 2020-04-11 DIAGNOSIS — M6281 Muscle weakness (generalized): Secondary | ICD-10-CM

## 2020-04-11 DIAGNOSIS — M25511 Pain in right shoulder: Secondary | ICD-10-CM | POA: Diagnosis not present

## 2020-04-11 DIAGNOSIS — M25611 Stiffness of right shoulder, not elsewhere classified: Secondary | ICD-10-CM

## 2020-04-11 NOTE — Therapy (Signed)
[place], [place], [address] Phone: [phone]   Fax:  [phone]  Physical Therapy Treatment  Patient Details  Name: Deborah York MRN: [id number] Date of Birth: 08-16-1956 Referring Provider (PT): Aundria Mems, MD   Encounter Date: 04/11/2020  PT End of Session - 04/11/20 0805    Visit Number  2    Number of Visits  13    Date for PT Re-Evaluation  05/20/20    Authorization Type  [facility]R    PT Start Time  0800    PT Stop Time  0848    PT Time Calculation (min)  48 min       Past Medical History:  Diagnosis Date  . Hypertension   . Migraines     Past Surgical History:  Procedure Laterality Date  . TOE SURGERY      There were no vitals filed for this visit.  Subjective Assessment - 04/11/20 0804    Subjective  I always have a little pain in the upper arm.    Currently in Pain?  Yes    Pain Location  Shoulder    Pain Orientation  Right    Pain Descriptors / Indicators  Aching   stiffness        [facility] PT Assessment - 04/11/20 0001      PROM   Right Shoulder Flexion  125 Degrees                    [facility] Adult PT Treatment/Exercise - 04/11/20 0001      Shoulder Exercises: Supine   Other Supine Exercises  review of PROM shOulder flexion, ER with dowel (max cues for correct technique)       Shoulder Exercises: Seated   Other Seated Exercises  scap retract x 10       Shoulder Exercises: ROM/Strengthening   Other ROM/Strengthening Exercises  AROM elbow flexion in supine, PROM bicep stretch, towel gripping 5 sec x 10     Other ROM/Strengthening Exercises  Pendulums, Table slides for flexion, scaption, ER       Manual Therapy   Manual therapy comments  PROM flex, scap, ER, IR , STW right bicep with gentle stretch to bicep       Neck Exercises: Stretches   Upper Trapezius Stretch  2 reps;20 seconds    Levator Stretch  2 reps;20 seconds               PT  Short Term Goals - 04/08/20 1140      PT SHORT TERM GOAL #1   Title  Pt will demo AROM without shoulder elevation    Baseline  <90 deg available at eval    Time  3    Period  Weeks    Status  New    Target Date  04/29/20      PT SHORT TERM GOAL #2   Title  full PROM available    Baseline  limited to 90 in flx & abd due to pain at eval    Time  3    Period  Weeks    Status  New    Target Date  04/29/20        PT Long Term Goals - 04/08/20 1141      PT LONG TERM GOAL #1   Title  Pt will be independent with rest breaks and postural adjustments during long days at work    Baseline  began  discussing at eval, pain with documentation posture    Time  6    Period  Weeks    Status  New    Target Date  05/20/20      PT LONG TERM GOAL #2   Title  Pt will be able to lift light weights in all ranges to begin return to long term UE exercise program    Baseline  unable at eval    Time  6    Period  Weeks    Status  New    Target Date  05/20/20      PT LONG TERM GOAL #3   Title  Pt will be able to begin yard work again    Baseline  avoiding at eval due to pain    Time  6    Period  Weeks    Status  New    Target Date  05/20/20      PT LONG TERM GOAL #4   Title  pt will be able to complete household chores and care activities for husband without being limited by pain    Baseline  limited by pain at eval    Time  6    Period  Weeks    Status  New    Target Date  05/20/20            Plan - 04/11/20 0902    Clinical Impression Statement  Pt demonstrates improved PROM. She reqyured cues to perform HEP correctly and avoid active movement. Contiued with Passive table slide stretches and manual PROM to shoulder. Her bicep is also sore, Performed STW to right bicep and began AROM of elbow and towel grippping. HMP at end of session.    PT Next Visit Plan  Will be ready for AAROM next visit      (continue PROM week 1, AROM week 2, strengthening week 3-4 & then update MD)    PT  Home Exercise Plan  passive supine flx & ER/IR, scap retraction, upper trap & levator stretch, pendulums       Patient will benefit from skilled therapeutic intervention in order to improve the following deficits and impairments:  Pain, Postural dysfunction, Increased muscle spasms, Decreased activity tolerance, Decreased range of motion, Decreased strength, Impaired flexibility  Visit Diagnosis: Acute pain of right shoulder  Stiffness of right shoulder, not elsewhere classified  Muscle weakness (generalized)     Problem List Patient Active Problem List   Diagnosis Date Noted  . Right hand pain 03/25/2020  . Fracture of humeral head, right, closed 03/04/2020  . ADHD (attention deficit hyperactivity disorder) 01/29/2020  . Hypertension 01/29/2020  . Atrophic vaginitis 06/21/2017  . Migraine headache 06/13/2016  . Age-related facial wrinkles 12/04/2012  . Other specified hypertrophic and atrophic condition of skin 12/04/2012  . 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 Rosacea 12/04/2012  . Visual field defect 12/04/2012    Dorene Ar, PTA 04/11/2020, 9:11 AM  [place], [place], [address] Phone: [phone]   Fax:  [phone]  Name: EMANDA SOO MRN: [id number] Date of Birth: 1956-09-24

## 2020-04-14 MED FILL — SPIRONOLACTONE 100 MG TAB: 100 | 90 days supply | Qty: 90 | Fill #1

## 2020-04-14 MED FILL — METHYLPHENIDATE 10 MG TAB: 10 | 30 days supply | Qty: 90 | Fill #0

## 2020-04-14 MED FILL — DESVENLAFAXINE SUC ER 50 MG: 50 | 90 days supply | Qty: 90 | Fill #1

## 2020-04-15 ENCOUNTER — Other Ambulatory Visit: Payer: Self-pay

## 2020-04-15 ENCOUNTER — Emergency Department
Admission: EM | Admit: 2020-04-15 | Discharge: 2020-04-15 | Disposition: A | Discharge status: Home / Self Care | Payer: 59 | Source: Home / Self Care

## 2020-04-15 DIAGNOSIS — L02215 Cutaneous abscess of perineum: Secondary | ICD-10-CM | POA: Diagnosis not present

## 2020-04-15 MED ORDER — DOXYCYCLINE HYCLATE 100 MG PO CAPS: 100.0000 mg | ORAL_CAPSULE | Freq: Two times a day (BID) | ORAL | 0 refills | Status: AC

## 2020-04-15 MED FILL — DOXYCYCLINE HYCLATE 100 MG: 100 | 10 days supply | Qty: 20 | Fill #0

## 2020-04-15 NOTE — ED Triage Notes (Signed)
Patient presents to Urgent Care with complaints of perianal abscess. Patient reports it is coming to a head, thinks it should be drained. Pt states she has had it in the past, has tried soaks and warm compresses but it keeps coming back.

## 2020-04-15 NOTE — ED Provider Notes (Signed)
[name]    CSN: [id number] Arrival date & time: 04/15/20  1040      History   Chief Complaint Chief Complaint  Patient presents with  . Abscess    HPI GENOVEVA PU is a 64 y.o. female.   HPI  TYMIRA VILLALONA is a 64 y.o. female presenting to UC with c/o intermittent skin sore for about 2 years in perineal area.  The area of pain and swelling waxes and wanes.  Most recent area started to become more painful over the last 1 week. She has tried soaking and using warm compresses w/o relief. She has never needed an abscess drained before.  Denies fever, chills, n/v/d.    Past Medical History:  Diagnosis Date  . Hypertension   . Migraines     Patient Active Problem List   Diagnosis Date Noted  . Right hand pain 03/25/2020  . Fracture of humeral head, right, closed 03/04/2020  . ADHD (attention deficit hyperactivity disorder) 01/29/2020  . Hypertension 01/29/2020  . Atrophic vaginitis 06/21/2017  . Migraine headache 06/13/2016  . Age-related facial wrinkles 12/04/2012  . Other specified hypertrophic and atrophic condition of skin 12/04/2012  . 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 Rosacea 12/04/2012  . Visual field defect 12/04/2012    Past Surgical History:  Procedure Laterality Date  . TOE SURGERY      OB History   No obstetric history on file.      Home Medications    Prior to Admission medications   Medication Sig Start Date End Date Taking? Authorizing Provider  amLODipine (NORVASC) 5 MG tablet  11/17/18   [provider]  Calcium Carbonate-Vitamin D 600-400 MG-UNIT tablet Take 1 tablet by mouth 2 (two) times daily. 03/04/20   Silverio Decamp, MD  cholecalciferol (VITAMIN D) 25 MCG (1000 UT) tablet Take by mouth.    [provider]  desvenlafaxine (PRISTIQ) 50 MG 24 hr tablet  01/14/19   [provider]  doxycycline (VIBRAMYCIN) 100 MG capsule Take 1 capsule (100 mg total) by mouth 2 (two) times daily for 10 days.  04/15/20 04/25/20  Noe Gens, PA-C  estradiol (ESTRACE) 0.1 MG/GM vaginal cream Apply intravaginally qhs x 1 week then 2-3 nights weekly 06/21/17   [provider]  gabapentin (NEURONTIN) 600 MG tablet  10/23/19   [provider]  Rolan Lipa 145 MCG CAPS capsule  01/27/20   [provider]  methylphenidate (RITALIN) 10 MG tablet  01/14/19   [provider]  Multiple Vitamins-Minerals (MULTIVITAMIN ADULT) TABS Take by mouth.    [provider]  norethindrone-ethinyl estradiol (FEMHRT 1/5) 1-5 MG-MCG TABS tablet  01/27/20   [provider]  spironolactone (ALDACTONE) 100 MG tablet  01/14/19   [provider]  SUMAtriptan (IMITREX) 100 MG tablet  12/04/18   [provider]  tretinoin (RETIN-A) 0.1 % cream APPLY SPARINGLY TO AFFECTED AREA(S) ONCE DAILY AT BEDTIME. 01/26/18   [provider]  triamcinolone cream (KENALOG) 0.1 % SMARTSIG:1 Sparingly Topical Twice Daily PRN 01/27/20   [provider]    Family History Family History  Problem Relation Age of Onset  . Cancer Mother   . Heart failure Father     Social History Social History   Tobacco Use  . Smoking status: Former Research scientist (life sciences)  . Smokeless tobacco: Former Network engineer Use Topics  . Alcohol use: Not Currently  . Drug use: Never     Allergies   Sulfa antibiotics  Review of Systems Review of Systems  Constitutional: Negative for chills and fever.  Skin: Positive for color change. Negative for wound.     Physical Exam Triage Vital Signs ED Triage Vitals  Enc Vitals Group     BP 04/15/20 1058 131/80     Pulse Rate 04/15/20 1058 77     Resp 04/15/20 1058 16     Temp 04/15/20 1058 98.1 F (36.7 C)     Temp Source 04/15/20 1058 Oral     SpO2 04/15/20 1058 100 %     Weight --      Height --      Head Circumference --      Peak Flow --      Pain Score 04/15/20 1056 5     Pain Loc --      Pain Edu? --      Excl. in [place]? --    No data  found.  Updated Vital Signs BP 131/80 (BP Location: Left Arm)   Pulse 77   Temp 98.1 F (36.7 C) (Oral)   Resp 16   SpO2 100%   Visual Acuity Right Eye Distance:   Left Eye Distance:   Bilateral Distance:    Right Eye Near:   Left Eye Near:    Bilateral Near:     Physical Exam Vitals and nursing note reviewed. Exam conducted with a chaperone present.  Constitutional:      Appearance: She is well-developed.  HENT:     Head: Normocephalic and atraumatic.  Cardiovascular:     Rate and Rhythm: Normal rate.  Pulmonary:     Effort: Pulmonary effort is normal.  Genitourinary:   Musculoskeletal:        General: Normal range of motion.     Cervical back: Normal range of motion.  Skin:    General: Skin is warm and dry.  Neurological:     Mental Status: She is alert and oriented to person, place, and time.  Psychiatric:        Behavior: Behavior normal.      UC Treatments / Results  Labs (all labs ordered are listed, but only abnormal results are displayed) Labs Reviewed  WOUND CULTURE    EKG   Radiology No results found.  Procedures Incision and Drainage  Date/Time: 04/15/2020 1:21 PM Performed by: Noe Gens, PA-C Authorized by: Noe Gens, PA-C   Consent:    Consent obtained:  Verbal   Consent given by:  Patient   Risks discussed:  Bleeding, infection, pain, incomplete drainage and damage to other organs   Alternatives discussed:  Delayed treatment Location:    Type:  Abscess   Size:  2   Location:  Anogenital   Anogenital location:  Perineum Pre-procedure details:    Skin preparation:  Betadine Anesthesia (see MAR for exact dosages):    Anesthesia method:  Local infiltration   Local anesthetic:  Lidocaine 1% WITH epi Procedure type:    Complexity:  Simple Procedure details:    Incision types:  Single straight   Incision depth:  Dermal   Scalpel blade:  11   Wound management:  Probed and deloculated   Drainage:  Bloody and purulent    Drainage amount:  Scant   Wound treatment:  Wound left open   Packing materials:  None Post-procedure details:    Patient tolerance of procedure:  Tolerated well, no immediate complications   (including critical care time)  Medications Ordered in UC Medications - No  data to display  Initial Impression / Assessment and Plan / UC Course  I have reviewed the triage vital signs and the nursing notes.  Pertinent labs & imaging results that were available during my care of the patient were reviewed by me and considered in my medical decision making (see chart for details).     Skin abscess I&D as noted above Wound culture sent Will start pt on doxycycline Home care instructions discussed AVS provided  Final Clinical Impressions(s) / UC Diagnoses   Final diagnoses:  Cutaneous abscess of perineum     Discharge Instructions      Please take antibiotics as prescribed and be sure to complete entire course even if you start to feel better to ensure infection does not come back.  You can continue to use warm compresses and sitz baths. You should wear a feminine pad for the next 2-3 days as incision heals.   Please follow up if not improving within 1 week, sooner if symptoms worsening.    ED Prescriptions    Medication Sig Dispense Auth. Provider   doxycycline (VIBRAMYCIN) 100 MG capsule Take 1 capsule (100 mg total) by mouth 2 (two) times daily for 10 days. 20 capsule Noe Gens, PA-C     PDMP not reviewed this encounter.   Noe Gens, [place] 04/15/20 1322

## 2020-04-15 NOTE — Discharge Instructions (Signed)
  Please take antibiotics as prescribed and be sure to complete entire course even if you start to feel better to ensure infection does not come back.  You can continue to use warm compresses and sitz baths. You should wear a feminine pad for the next 2-3 days as incision heals.   Please follow up if not improving within 1 week, sooner if symptoms worsening.

## 2020-04-20 LAB — WOUND CULTURE
MICRO NUMBER:: 10532111
SPECIMEN QUALITY:: ADEQUATE

## 2020-04-22 ENCOUNTER — Other Ambulatory Visit: Payer: Self-pay

## 2020-04-22 ENCOUNTER — Encounter: Payer: Self-pay | Admitting: Sports Medicine

## 2020-04-22 ENCOUNTER — Ambulatory Visit (INDEPENDENT_AMBULATORY_CARE_PROVIDER_SITE_OTHER): Payer: 59 | Admitting: Sports Medicine

## 2020-04-22 DIAGNOSIS — S42291D Other displaced fracture of upper end of right humerus, subsequent encounter for fracture with routine healing: Secondary | ICD-10-CM | POA: Diagnosis not present

## 2020-04-22 DIAGNOSIS — M79641 Pain in right hand: Secondary | ICD-10-CM | POA: Diagnosis not present

## 2020-04-22 NOTE — Assessment & Plan Note (Signed)
With regards to her thumb she is also improving, not having enough pain yet to consider an injection into the thumb basal joint.

## 2020-04-22 NOTE — Progress Notes (Signed)
    Procedures performed today:    None.  Independent interpretation of notes and tests performed by another provider:   None.  Brief History, Exam, Impression, and Recommendations:    Fracture of humeral head, right, closed Dr. Owens Shark returns, she is now approximately 5-1/2 to 6 weeks post comminuted fracture of the right proximal humerus, doing well, x-rays at the last visit did show evidence of healing. We started physical therapy, she is only got a couple of sessions and is starting to note improvement in her pain and motion. I think we should do at least another 6 weeks of PT before considering advanced imaging or an injection.   Right hand pain With regards to her thumb she is also improving, not having enough pain yet to consider an injection into the thumb basal joint.    ___________________________________________ Gwen Her. Dianah Field, M.D., ABFM., CAQSM. Primary Care and [place] Instructor of [employer] [facility]

## 2020-04-22 NOTE — Assessment & Plan Note (Signed)
Dr. Owens Shark returns, she is now approximately 5-1/2 to 6 weeks post comminuted fracture of the right proximal humerus, doing well, x-rays at the last visit did show evidence of healing. We started physical therapy, she is only got a couple of sessions and is starting to note improvement in her pain and motion. I think we should do at least another 6 weeks of PT before considering advanced imaging or an injection.

## 2020-04-26 ENCOUNTER — Other Ambulatory Visit: Payer: Self-pay

## 2020-04-26 ENCOUNTER — Encounter: Payer: Self-pay | Admitting: Physical Therapy

## 2020-04-26 ENCOUNTER — Ambulatory Visit: Payer: 59 | Attending: Sports Medicine | Admitting: Physical Therapy

## 2020-04-26 DIAGNOSIS — M6281 Muscle weakness (generalized): Secondary | ICD-10-CM | POA: Insufficient documentation

## 2020-04-26 DIAGNOSIS — M25511 Pain in right shoulder: Secondary | ICD-10-CM | POA: Insufficient documentation

## 2020-04-26 DIAGNOSIS — M25611 Stiffness of right shoulder, not elsewhere classified: Secondary | ICD-10-CM | POA: Diagnosis present

## 2020-04-26 NOTE — Therapy (Signed)
[place], [place], [address] Phone: [phone]   Fax:  [phone]  Physical Therapy Treatment  Patient Details  Name: Deborah York MRN: [id number] Date of Birth: Dec 31, 1955 Referring Provider (PT): Aundria Mems, MD   Encounter Date: 04/26/2020  PT End of Session - 04/26/20 0719    Visit Number  3    Number of Visits  13    Date for PT Re-Evaluation  05/20/20    Authorization Type  [facility]R    PT Start Time  0715    PT Stop Time  0758    PT Time Calculation (min)  43 min       Past Medical History:  Diagnosis Date  . Hypertension   . Migraines     Past Surgical History:  Procedure Laterality Date  . TOE SURGERY      There were no vitals filed for this visit.  Subjective Assessment - 04/26/20 0717    Subjective  Pain is usually 2-3/10 at rest. 5/10 when I use the arm.    Currently in Pain?  Yes    Pain Score  3     Pain Location  Shoulder    Pain Orientation  Right    Pain Descriptors / Indicators  Aching    Pain Type  Acute pain    Aggravating Factors   dressing    Pain Relieving Factors  heatt         [facility] PT Assessment - 04/26/20 0001      AROM   AROM Assessment Site  Shoulder    Right/Left Shoulder  Right    Right Shoulder Flexion  120 Degrees    Right Shoulder Internal Rotation  --   reach L1    Right Shoulder External Rotation  --   Reach T2                   [facility] Adult PT Treatment/Exercise - 04/26/20 0001      Shoulder Exercises: Supine   Other Supine Exercises  Supine cane press ups and pullovers, ER AAROM       Shoulder Exercises: Seated   Other Seated Exercises  scap retract x 10       Shoulder Exercises: Standing   Other Standing Exercises  Bicep AROM       Shoulder Exercises: ROM/Strengthening   Other ROM/Strengthening Exercises  standing wall slides for flexion using pillow case to decrease friction    Other ROM/Strengthening Exercises   standing cane flexion, scaption, ER, IR, extension       Manual Therapy   Manual therapy comments  PROM flex, scap, ER, IR               PT Short Term Goals - 04/08/20 1140      PT SHORT TERM GOAL #1   Title  Pt will demo AROM without shoulder elevation    Baseline  <90 deg available at eval    Time  3    Period  Weeks    Status  New    Target Date  04/29/20      PT SHORT TERM GOAL #2   Title  full PROM available    Baseline  limited to 90 in flx & abd due to pain at eval    Time  3    Period  Weeks    Status  New    Target Date  04/29/20  PT Long Term Goals - 04/08/20 1141      PT LONG TERM GOAL #1   Title  Pt will be independent with rest breaks and postural adjustments during long days at work    Baseline  began discussing at eval, pain with documentation posture    Time  6    Period  Weeks    Status  New    Target Date  05/20/20      PT LONG TERM GOAL #2   Title  Pt will be able to lift light weights in all ranges to begin return to long term UE exercise program    Baseline  unable at eval    Time  6    Period  Weeks    Status  New    Target Date  05/20/20      PT LONG TERM GOAL #3   Title  Pt will be able to begin yard work again    Baseline  avoiding at eval due to pain    Time  6    Period  Weeks    Status  New    Target Date  05/20/20      PT LONG TERM GOAL #4   Title  pt will be able to complete household chores and care activities for husband without being limited by pain    Baseline  limited by pain at eval    Time  6    Period  Weeks    Status  New    Target Date  05/20/20            Plan - 04/26/20 0759    Clinical Impression Statement  Pt demonstrates functional AROM for flexion, IR,ER, She is dressing herself but still has pain. Progrssed with AAROM and she tolerated the session well.    PT Next Visit Plan  Will be ready for AAROM next visit      (continue PROM week 1, AROM week 2, strengthening week 3-4 & then update  MD)    PT Home Exercise Plan  passive supine flx & ER/IR, scap retraction, upper trap & levator stretch, pendulums       Patient will benefit from skilled therapeutic intervention in order to improve the following deficits and impairments:  Pain, Postural dysfunction, Increased muscle spasms, Decreased activity tolerance, Decreased range of motion, Decreased strength, Impaired flexibility  Visit Diagnosis: Acute pain of right shoulder  Stiffness of right shoulder, not elsewhere classified  Muscle weakness (generalized)     Problem List Patient Active Problem List   Diagnosis Date Noted  . Right hand pain 03/25/2020  . Fracture of humeral head, right, closed 03/04/2020  . ADHD (attention deficit hyperactivity disorder) 01/29/2020  . Hypertension 01/29/2020  . Atrophic vaginitis 06/21/2017  . Migraine headache 06/13/2016  . Age-related facial wrinkles 12/04/2012  . Other specified hypertrophic and atrophic condition of skin 12/04/2012  . 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 Rosacea 12/04/2012  . Visual field defect 12/04/2012    Dorene Ar, PTA 04/26/2020, 8:06 AM  [facility] [address] [place], [place], [address] Phone: [phone]   Fax:  [phone]  Name: Deborah York MRN: [id number] Date of Birth: 15-Dec-1955

## 2020-04-28 ENCOUNTER — Encounter: Payer: Self-pay | Admitting: Physical Therapy

## 2020-04-28 ENCOUNTER — Ambulatory Visit: Payer: 59 | Admitting: Physical Therapy

## 2020-04-28 ENCOUNTER — Other Ambulatory Visit: Payer: Self-pay

## 2020-04-28 DIAGNOSIS — M25611 Stiffness of right shoulder, not elsewhere classified: Secondary | ICD-10-CM

## 2020-04-28 DIAGNOSIS — M25511 Pain in right shoulder: Secondary | ICD-10-CM

## 2020-04-28 DIAGNOSIS — M6281 Muscle weakness (generalized): Secondary | ICD-10-CM

## 2020-04-28 NOTE — Therapy (Signed)
[place], [place], [address] Phone: [phone]   Fax:  [phone]  Physical Therapy Treatment  Patient Details  Name: Deborah York MRN: [id number] Date of Birth: 1955-11-26 Referring Provider (PT): Aundria Mems, MD   Encounter Date: 04/28/2020   PT End of Session - 04/28/20 0717    Visit Number 4    Number of Visits 13    Date for PT Re-Evaluation 05/20/20    Authorization Type UMR    PT Start Time 0713    PT Stop Time 0815    PT Time Calculation (min) 62 min           Past Medical History:  Diagnosis Date  . Hypertension   . Migraines     Past Surgical History:  Procedure Laterality Date  . TOE SURGERY      There were no vitals filed for this visit.   Subjective Assessment - 04/28/20 0715    Subjective I was a little sore after the last session but doing okay.              [facility] PT Assessment - 04/28/20 0001      PROM   Right Shoulder Flexion 140 Degrees    Right Shoulder ABduction 130 Degrees    Right Shoulder Internal Rotation 60 Degrees    Right Shoulder External Rotation 50 Degrees                         [facility] Adult PT Treatment/Exercise - 04/28/20 0001      Shoulder Exercises: Supine   Other Supine Exercises Supine cane press ups and pullovers, ER AAROM       Shoulder Exercises: Sidelying   External Rotation 15 reps;AROM    ABduction AROM;10 reps      Shoulder Exercises: Standing   External Rotation 10 reps    Theraband Level (Shoulder External Rotation) Level 1 (Yellow)    Internal Rotation 10 reps    Theraband Level (Shoulder Internal Rotation) Level 1 (Yellow)    Retraction 15 reps    Theraband Level (Shoulder Retraction) Level 2 (Red)    Other Standing Exercises Bicep AROM 1#      Shoulder Exercises: ROM/Strengthening   Other ROM/Strengthening Exercises standing wall slides for flexion using pillow case to decrease friction     Other ROM/Strengthening Exercises standing cane flexion, scaption, ER, IR, extension       Modalities   Modalities Moist Heat      Moist Heat Therapy   Number Minutes Moist Heat 15 Minutes    Moist Heat Location Shoulder      Manual Therapy   Manual therapy comments PROM flex, scap, ER, IR                    PT Short Term Goals - 04/08/20 1140      PT SHORT TERM GOAL #1   Title Pt will demo AROM without shoulder elevation    Baseline <90 deg available at eval    Time 3    Period Weeks    Status New    Target Date 04/29/20      PT SHORT TERM GOAL #2   Title full PROM available    Baseline limited to 90 in flx & abd due to pain at eval    Time 3    Period Weeks    Status New    Target Date 04/29/20  PT Long Term Goals - 04/08/20 1141      PT LONG TERM GOAL #1   Title Pt will be independent with rest breaks and postural adjustments during long days at work    Baseline began discussing at eval, pain with documentation posture    Time 6    Period Weeks    Status New    Target Date 05/20/20      PT LONG TERM GOAL #2   Title Pt will be able to lift light weights in all ranges to begin return to long term UE exercise program    Baseline unable at eval    Time 6    Period Weeks    Status New    Target Date 05/20/20      PT LONG TERM GOAL #3   Title Pt will be able to begin yard work again    Baseline avoiding at eval due to pain    Time 6    Period Weeks    Status New    Target Date 05/20/20      PT LONG TERM GOAL #4   Title pt will be able to complete household chores and care activities for husband without being limited by pain    Baseline limited by pain at eval    Time 6    Period Weeks    Status New    Target Date 05/20/20                 Plan - 04/28/20 0824    Clinical Impression Statement Pts AROM and PROM have improved. Gentle RTC strength with yellow bands added today. Progressing toward STGs.    PT Next Visit  Plan Will be ready for AAROM next visit      (continue PROM week 1, AROM week 2, strengthening week 3-4 & then update MD)    PT Home Exercise Plan passive supine flx & ER/IR, scap retraction, upper trap & levator stretch, pendulums, , verbal instruction for AROM forward, lateral, and sidelying,yellow band IR,ER, scap retract yellow           Patient will benefit from skilled therapeutic intervention in order to improve the following deficits and impairments:  Pain, Postural dysfunction, Increased muscle spasms, Decreased activity tolerance, Decreased range of motion, Decreased strength, Impaired flexibility  Visit Diagnosis: Acute pain of right shoulder  Stiffness of right shoulder, not elsewhere classified  Muscle weakness (generalized)     Problem List Patient Active Problem List   Diagnosis Date Noted  . Right hand pain 03/25/2020  . Fracture of humeral head, right, closed 03/04/2020  . ADHD (attention deficit hyperactivity disorder) 01/29/2020  . Hypertension 01/29/2020  . Atrophic vaginitis 06/21/2017  . Migraine headache 06/13/2016  . Age-related facial wrinkles 12/04/2012  . Other specified hypertrophic and atrophic condition of skin 12/04/2012  . 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 Rosacea 12/04/2012  . Visual field defect 12/04/2012    Dorene Ar, PTA 04/28/2020, 8:53 AM  [facility] [address] [place], [place], [address] Phone: [phone]   Fax:  [phone]  Name: Deborah York MRN: [id number] Date of Birth: 12/05/1955

## 2020-05-05 ENCOUNTER — Ambulatory Visit: Payer: 59 | Admitting: Physical Therapy

## 2020-05-05 ENCOUNTER — Other Ambulatory Visit: Payer: Self-pay

## 2020-05-05 ENCOUNTER — Encounter: Payer: Self-pay | Admitting: Physical Therapy

## 2020-05-05 DIAGNOSIS — M25511 Pain in right shoulder: Secondary | ICD-10-CM | POA: Diagnosis not present

## 2020-05-05 DIAGNOSIS — M6281 Muscle weakness (generalized): Secondary | ICD-10-CM

## 2020-05-05 DIAGNOSIS — M25611 Stiffness of right shoulder, not elsewhere classified: Secondary | ICD-10-CM | POA: Diagnosis not present

## 2020-05-05 NOTE — Therapy (Signed)
[place], [place], [address] Phone: [phone]   Fax:  [phone]  Physical Therapy Treatment  Patient Details  Name: Deborah York MRN: [id number] Date of Birth: 1956-10-29 Referring Provider (PT): Aundria Mems, MD   Encounter Date: 05/05/2020   PT End of Session - 05/05/20 1711    Visit Number 5    Number of Visits 13    Date for PT Re-Evaluation 05/20/20    Authorization Type UMR    PT Start Time 1705    PT Stop Time 1751    PT Time Calculation (min) 46 min    Activity Tolerance Patient tolerated treatment well    Behavior During Therapy [facility] for tasks assessed/performed           Past Medical History:  Diagnosis Date  . Hypertension   . Migraines     Past Surgical History:  Procedure Laterality Date  . TOE SURGERY      There were no vitals filed for this visit.   Subjective Assessment - 05/05/20 1707    Subjective I still cant do my yardwork. Hard to raise overhead.  It catches.    Currently in Pain? Yes    Pain Score 3     Pain Location Shoulder    Pain Orientation Right    Pain Descriptors / Indicators Aching    Pain Type Acute pain    Pain Onset More than a month ago    Pain Frequency Intermittent    Aggravating Factors  dressing, hurts more after PT    Pain Relieving Factors heat, rest    Effect of Pain on Daily Activities try to avoid exercise            [facility] Adult PT Treatment/Exercise - 05/05/20 0001      Shoulder Exercises: Supine   Horizontal ABduction AROM;Both;10 reps    External Rotation Strengthening;Both;15 reps;Theraband    Theraband Level (Shoulder External Rotation) Level 2 (Red)    Flexion Strengthening;Right;15 reps;Weights    Shoulder Flexion Weight (lbs) 2    Other Supine Exercises small circles x 10 each direction 1 lbs     Other Supine Exercises supine AAROM flexion x 10       Shoulder Exercises: Sidelying   External Rotation  Strengthening;Right;15 reps    External Rotation Weight (lbs) 1    ABduction AROM;10 reps      Shoulder Exercises: Standing   External Rotation 10 reps    Theraband Level (Shoulder External Rotation) Level 2 (Red)    Internal Rotation 10 reps    Theraband Level (Shoulder Internal Rotation) Level 2 (Red)    Row Strengthening;10 reps    Theraband Level (Shoulder Row) Level 2 (Red)    Other Standing Exercises arc with LUE supporting on wall and then Rt x 8    Other Standing Exercises standing AAROM flexion on the wall      Shoulder Exercises: Pulleys   Flexion 3 minutes    Scaption 2 minutes      Moist Heat Therapy   Number Minutes Moist Heat 10 Minutes    Moist Heat Location Shoulder      Manual Therapy   Manual Therapy Joint mobilization    Manual therapy comments PROM flex, scap, ER, IR    Joint Mobilization gentle Gr. I inferior glide                     PT Short Term Goals -  05/05/20 1743      PT SHORT TERM GOAL #1   Title Pt will demo AROM without shoulder elevation    Baseline >110 deg    Status Achieved      PT SHORT TERM GOAL #2   Title full PROM available    Baseline limited in ER and abduction due pain (ER 60 and Abd 100)    Status Partially Met             PT Long Term Goals - 04/08/20 1141      PT LONG TERM GOAL #1   Title Pt will be independent with rest breaks and postural adjustments during long days at work    Baseline began discussing at eval, pain with documentation posture    Time 6    Period Weeks    Status New    Target Date 05/20/20      PT LONG TERM GOAL #2   Title Pt will be able to lift light weights in all ranges to begin return to long term UE exercise program    Baseline unable at eval    Time 6    Period Weeks    Status New    Target Date 05/20/20      PT LONG TERM GOAL #3   Title Pt will be able to begin yard work again    Baseline avoiding at eval due to pain    Time 6    Period Weeks    Status New    Target  Date 05/20/20      PT LONG TERM GOAL #4   Title pt will be able to complete household chores and care activities for husband without being limited by pain    Baseline limited by pain at eval    Time 6    Period Weeks    Status New    Target Date 05/20/20                 Plan - 05/05/20 1744    Clinical Impression Statement Patient is doing well, focused on strengthening within available ROM, tolerated red band for many exercises.  Engaging scapula with exercises reduced "catch". Progressing well.    PT Treatment/Interventions ADLs/Self Care Home Management;Cryotherapy;Electrical Stimulation;Moist Heat;Iontophoresis 34m/ml Dexamethasone;Therapeutic activities;Therapeutic exercise;Patient/family education;Neuromuscular re-education;Manual techniques;Taping;Dry needling;Passive range of motion    PT Next Visit Plan cont strengthening    PT Home Exercise Plan passive supine flx & ER/IR, scap retraction, upper trap & levator stretch, pendulums, , verbal instruction for AROM forward, lateral, and sidelying,yellow band IR,ER, scap retract yellow           Patient will benefit from skilled therapeutic intervention in order to improve the following deficits and impairments:  Pain, Postural dysfunction, Increased muscle spasms, Decreased activity tolerance, Decreased range of motion, Decreased strength, Impaired flexibility  Visit Diagnosis: Acute pain of right shoulder  Stiffness of right shoulder, not elsewhere classified  Muscle weakness (generalized)     Problem List Patient Active Problem List   Diagnosis Date Noted  . Right hand pain 03/25/2020  . Fracture of humeral head, right, closed 03/04/2020  . ADHD (attention deficit hyperactivity disorder) 01/29/2020  . Hypertension 01/29/2020  . Atrophic vaginitis 06/21/2017  . Migraine headache 06/13/2016  . Age-related facial wrinkles 12/04/2012  . Other specified hypertrophic and atrophic condition of skin 12/04/2012  .  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 Rosacea 12/04/2012  . Visual field defect 12/04/2012    Deborah York 05/05/2020, 5:46 PM  [place], [place], [address] Phone: [phone]   Fax:  [phone]  Name: Deborah York MRN: [id number] Date of Birth: 1956-03-22  Raeford Razor, PT 05/05/20 5:47 PM Phone: [phone] Fax: [phone]

## 2020-05-06 ENCOUNTER — Ambulatory Visit: Payer: 59 | Admitting: Physical Therapy

## 2020-05-06 ENCOUNTER — Encounter: Payer: Self-pay | Admitting: Physical Therapy

## 2020-05-06 DIAGNOSIS — M25511 Pain in right shoulder: Secondary | ICD-10-CM

## 2020-05-06 DIAGNOSIS — M6281 Muscle weakness (generalized): Secondary | ICD-10-CM

## 2020-05-06 DIAGNOSIS — M25611 Stiffness of right shoulder, not elsewhere classified: Secondary | ICD-10-CM

## 2020-05-06 NOTE — Therapy (Signed)
[place], [place], [address] Phone: [phone]   Fax:  [phone]  Physical Therapy Treatment  Patient Details  Name: Deborah York MRN: [id number] Date of Birth: 04-21-1956 Referring Provider (PT): Aundria Mems, MD   Encounter Date: 05/06/2020   PT End of Session - 05/06/20 0752    Visit Number 6    Number of Visits 13    Date for PT Re-Evaluation 05/20/20    Authorization Type UMR    PT Start Time 0746    PT Stop Time 0840    PT Time Calculation (min) 54 min    Activity Tolerance Patient tolerated treatment well    Behavior During Therapy [facility] for tasks assessed/performed           Past Medical History:  Diagnosis Date  . Hypertension   . Migraines     Past Surgical History:  Procedure Laterality Date  . TOE SURGERY      There were no vitals filed for this visit.   Subjective Assessment - 05/06/20 0751    Subjective Pt was here last night.  I am sore today, 4/10. Used heating pad when I went home last night.    Currently in Pain? Yes    Pain Score 4              [facility] Adult PT Treatment/Exercise - 05/06/20 0001      Shoulder Exercises: Supine   Other Supine Exercises supine cane AAROM flexion, ER, chest press and abduction/adduction x 10-15 reps each to tolerance       Shoulder Exercises: Standing   Extension Strengthening;Both;15 reps    Theraband Level (Shoulder Extension) Level 2 (Red)    Row Strengthening;10 reps    Theraband Level (Shoulder Row) Level 2 (Red)    Retraction Weight (lbs) focus with row, ext       Shoulder Exercises: ROM/Strengthening   Ranger standing weightshift, flexion, arcs and IR x 15 each     Other ROM/Strengthening Exercises standing joint distraction with stretch out strap , capsule stretching       Moist Heat Therapy   Number Minutes Moist Heat 15 Minutes    Moist Heat Location Shoulder      Manual Therapy   Manual Therapy Soft  tissue mobilization    Manual therapy comments PROM flex, scap, ER, IR    Joint Mobilization gentle Gr. I inferior glide     Soft tissue mobilization Rt shoulder, upper traps, ant/middle deltoid, pecs and biceps                     PT Short Term Goals - 05/05/20 1743      PT SHORT TERM GOAL #1   Title Pt will demo AROM without shoulder elevation    Baseline >110 deg    Status Achieved      PT SHORT TERM GOAL #2   Title full PROM available    Baseline limited in ER and abduction due pain (ER 60 and Abd 100)    Status Partially Met             PT Long Term Goals - 04/08/20 1141      PT LONG TERM GOAL #1   Title Pt will be independent with rest breaks and postural adjustments during long days at work    Baseline began discussing at eval, pain with documentation posture    Time 6    Period Weeks  Status New    Target Date 05/20/20      PT LONG TERM GOAL #2   Title Pt will be able to lift light weights in all ranges to begin return to long term UE exercise program    Baseline unable at eval    Time 6    Period Weeks    Status New    Target Date 05/20/20      PT LONG TERM GOAL #3   Title Pt will be able to begin yard work again    Baseline avoiding at eval due to pain    Time 6    Period Weeks    Status New    Target Date 05/20/20      PT LONG TERM GOAL #4   Title pt will be able to complete household chores and care activities for husband without being limited by pain    Baseline limited by pain at eval    Time 6    Period Weeks    Status New    Target Date 05/20/20                 Plan - 05/06/20 0759    Clinical Impression Statement Sore from late PT session yesterday.  Chose to work on ROM and mobility today.  Relaxed post session with less soreness reported.    PT Treatment/Interventions ADLs/Self Care Home Management;Cryotherapy;Electrical Stimulation;Moist Heat;Iontophoresis 58m/ml Dexamethasone;Therapeutic activities;Therapeutic  exercise;Patient/family education;Neuromuscular re-education;Manual techniques;Taping;Dry needling;Passive range of motion    PT Next Visit Plan cont strengthening and ROM    PT Home Exercise Plan passive supine flx & ER/IR, scap retraction, upper trap & levator stretch, pendulums, , verbal instruction for AROM forward, lateral, and sidelying,yellow band IR,ER, scap retract yellow    Consulted and Agree with Plan of Care Patient           Patient will benefit from skilled therapeutic intervention in order to improve the following deficits and impairments:  Pain, Postural dysfunction, Increased muscle spasms, Decreased activity tolerance, Decreased range of motion, Decreased strength, Impaired flexibility  Visit Diagnosis: Acute pain of right shoulder  Stiffness of right shoulder, not elsewhere classified  Muscle weakness (generalized)     Problem List Patient Active Problem List   Diagnosis Date Noted  . Right hand pain 03/25/2020  . Fracture of humeral head, right, closed 03/04/2020  . ADHD (attention deficit hyperactivity disorder) 01/29/2020  . Hypertension 01/29/2020  . Atrophic vaginitis 06/21/2017  . Migraine headache 06/13/2016  . Age-related facial wrinkles 12/04/2012  . Other specified hypertrophic and atrophic condition of skin 12/04/2012  . 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 Rosacea 12/04/2012  . Visual field defect 12/04/2012    Tamsen Reist 05/06/2020, 8:34 AM  [facility] [address]Phone: [phone]  Fax:  [phone] Name: AARLYN BUMPUSMRN: [id number] of Birth: [birth year] JRaeford Razor PT 05/06/20 8:34 AM Phone: [phone]: [phone]

## 2020-05-10 ENCOUNTER — Encounter: Payer: Self-pay | Admitting: Physical Therapy

## 2020-05-10 ENCOUNTER — Ambulatory Visit: Payer: 59 | Admitting: Physical Therapy

## 2020-05-10 ENCOUNTER — Other Ambulatory Visit: Payer: Self-pay

## 2020-05-10 DIAGNOSIS — M25611 Stiffness of right shoulder, not elsewhere classified: Secondary | ICD-10-CM | POA: Diagnosis not present

## 2020-05-10 DIAGNOSIS — M25511 Pain in right shoulder: Secondary | ICD-10-CM

## 2020-05-10 DIAGNOSIS — M6281 Muscle weakness (generalized): Secondary | ICD-10-CM

## 2020-05-10 NOTE — Therapy (Signed)
[place], [place], [address] Phone: [phone]   Fax:  [phone]  Physical Therapy Treatment  Patient Details  Name: Deborah York MRN: [id number] Date of Birth: 1956/10/01 Referring Provider (PT): Aundria Mems, MD   Encounter Date: 05/10/2020   PT End of Session - 05/10/20 1756    Visit Number 7    Number of Visits 13    Date for PT Re-Evaluation 05/20/20    Authorization Type UMR    PT Start Time 6644    PT Stop Time 1757    PT Time Calculation (min) 42 min    Activity Tolerance Patient tolerated treatment well    Behavior During Therapy [facility] for tasks assessed/performed           Past Medical History:  Diagnosis Date  . Hypertension   . Migraines     Past Surgical History:  Procedure Laterality Date  . TOE SURGERY      There were no vitals filed for this visit.   Subjective Assessment - 05/10/20 1718    Subjective Feels like a snapping in the shoulder    Patient Stated Goals ROM, yard work- dirt/mulch bags, decrease pain, exercise- free weights, assist husband post CVA    Currently in Pain? Yes    Pain Score 3     Pain Location Shoulder    Pain Orientation Right    Pain Descriptors / Indicators Sore              [facility] PT Assessment - 05/10/20 0001      PROM   Right Shoulder Flexion 130 Degrees    Right Shoulder ABduction 115 Degrees    Right Shoulder Internal Rotation 40 Degrees    Right Shoulder External Rotation 50 Degrees                         [facility] Adult PT Treatment/Exercise - 05/10/20 0001      Shoulder Exercises: Supine   Other Supine Exercises rythmic stabs at 90 3x30s      Shoulder Exercises: Standing   Other Standing Exercises isometric flexion, abd, ext      Manual Therapy   Manual therapy comments prom all motions    Soft tissue mobilization Rt upper trap, suboccipitals, subscap      Neck Exercises: Stretches   Upper  Trapezius Stretch Right;Left   with sheet over shoulder                   PT Short Term Goals - 05/05/20 1743      PT SHORT TERM GOAL #1   Title Pt will demo AROM without shoulder elevation    Baseline >110 deg    Status Achieved      PT SHORT TERM GOAL #2   Title full PROM available    Baseline limited in ER and abduction due pain (ER 60 and Abd 100)    Status Partially Met             PT Long Term Goals - 04/08/20 1141      PT LONG TERM GOAL #1   Title Pt will be independent with rest breaks and postural adjustments during long days at work    Baseline began discussing at eval, pain with documentation posture    Time 6    Period Weeks    Status New    Target Date 05/20/20  PT LONG TERM GOAL #2   Title Pt will be able to lift light weights in all ranges to begin return to long term UE exercise program    Baseline unable at eval    Time 6    Period Weeks    Status New    Target Date 05/20/20      PT LONG TERM GOAL #3   Title Pt will be able to begin yard work again    Baseline avoiding at eval due to pain    Time 6    Period Weeks    Status New    Target Date 05/20/20      PT LONG TERM GOAL #4   Title pt will be able to complete household chores and care activities for husband without being limited by pain    Baseline limited by pain at eval    Time 6    Period Weeks    Status New    Target Date 05/20/20                 Plan - 05/10/20 1759    Clinical Impression Statement Tightness in subscap limiting flexion and abduction. concordant pain found in Rt upper trap trigger points and reduced with manual therapy. Added isometrics to HEP for deltoid activation.    PT Treatment/Interventions ADLs/Self Care Home Management;Cryotherapy;Electrical Stimulation;Moist Heat;Iontophoresis '4mg'$ /ml Dexamethasone;Therapeutic activities;Therapeutic exercise;Patient/family education;Neuromuscular re-education;Manual techniques;Taping;Dry needling;Passive  range of motion    PT Next Visit Plan cont stretching subscap, prone periscap strengthening    PT Home Exercise Plan passive supine flx & ER/IR, scap retraction, upper trap & levator stretch, pendulums, , verbal instruction for AROM forward, lateral, and sidelying,yellow band IR,ER, scap retract yellow, iso abd/ext/flx    Consulted and Agree with Plan of Care Patient           Patient will benefit from skilled therapeutic intervention in order to improve the following deficits and impairments:  Pain, Postural dysfunction, Increased muscle spasms, Decreased activity tolerance, Decreased range of motion, Decreased strength, Impaired flexibility  Visit Diagnosis: Acute pain of right shoulder  Stiffness of right shoulder, not elsewhere classified  Muscle weakness (generalized)     Problem List Patient Active Problem List   Diagnosis Date Noted  . Right hand pain 03/25/2020  . Fracture of humeral head, right, closed 03/04/2020  . ADHD (attention deficit hyperactivity disorder) 01/29/2020  . Hypertension 01/29/2020  . Atrophic vaginitis 06/21/2017  . Migraine headache 06/13/2016  . Age-related facial wrinkles 12/04/2012  . Other specified hypertrophic and atrophic condition of skin 12/04/2012  . 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 Rosacea 12/04/2012  . Visual field defect 12/04/2012    Deborah York PT, DPT 05/10/20 6:02 PM   [facility] [address] [place], [place], [address] Phone: [phone]   Fax:  [phone]  Name: Deborah York MRN: [id number] Date of Birth: 12-05-1955

## 2020-05-12 ENCOUNTER — Other Ambulatory Visit: Payer: Self-pay

## 2020-05-12 ENCOUNTER — Ambulatory Visit: Payer: 59 | Admitting: Physical Therapy

## 2020-05-12 ENCOUNTER — Encounter: Payer: Self-pay | Admitting: Physical Therapy

## 2020-05-12 DIAGNOSIS — M25611 Stiffness of right shoulder, not elsewhere classified: Secondary | ICD-10-CM | POA: Diagnosis not present

## 2020-05-12 DIAGNOSIS — M6281 Muscle weakness (generalized): Secondary | ICD-10-CM

## 2020-05-12 DIAGNOSIS — M25511 Pain in right shoulder: Secondary | ICD-10-CM | POA: Diagnosis not present

## 2020-05-12 NOTE — Therapy (Signed)
[place], [place], [address] Phone: [phone]   Fax:  [phone]  Physical Therapy Treatment  Patient Details  Name: Deborah York MRN: [id number] Date of Birth: 12-29-1955 Referring Provider (PT): Aundria Mems, MD   Encounter Date: 05/12/2020   PT End of Session - 05/12/20 1752    Visit Number 8    Number of Visits 13    Date for PT Re-Evaluation 05/20/20    Authorization Type UMR    PT Start Time 1713    PT Stop Time 1751    PT Time Calculation (min) 38 min    Activity Tolerance Patient tolerated treatment well    Behavior During Therapy [facility] for tasks assessed/performed           Past Medical History:  Diagnosis Date  . Hypertension   . Migraines     Past Surgical History:  Procedure Laterality Date  . TOE SURGERY      There were no vitals filed for this visit.   Subjective Assessment - 05/12/20 1717    Subjective Was very sore after last visit but less snapping reaching up, still snapping in eccentric motions.    Patient Stated Goals ROM, yard work- dirt/mulch bags, decrease pain, exercise- free weights, assist husband post CVA                             [facility] Adult PT Treatment/Exercise - 05/12/20 0001      Shoulder Exercises: Seated   External Rotation 20 reps;Both    Theraband Level (Shoulder External Rotation) Level 1 (Yellow)    Other Seated Exercises small punches yellow tband      Shoulder Exercises: Standing   Other Standing Exercises finger ladder- cues for elevation    Other Standing Exercises gentle WB into UE      Shoulder Exercises: Pulleys   Flexion 2 minutes    Flexion Limitations scapular assist      Manual Therapy   Manual therapy comments prom all motions    Soft tissue mobilization bil upper traps, suboccipitals, cervical distraction                    PT Short Term Goals - 05/05/20 1743      PT SHORT TERM GOAL  #1   Title Pt will demo AROM without shoulder elevation    Baseline >110 deg    Status Achieved      PT SHORT TERM GOAL #2   Title full PROM available    Baseline limited in ER and abduction due pain (ER 60 and Abd 100)    Status Partially Met             PT Long Term Goals - 04/08/20 1141      PT LONG TERM GOAL #1   Title Pt will be independent with rest breaks and postural adjustments during long days at work    Baseline began discussing at eval, pain with documentation posture    Time 6    Period Weeks    Status New    Target Date 05/20/20      PT LONG TERM GOAL #2   Title Pt will be able to lift light weights in all ranges to begin return to long term UE exercise program    Baseline unable at eval    Time 6    Period Weeks    Status  New    Target Date 05/20/20      PT LONG TERM GOAL #3   Title Pt will be able to begin yard work again    Baseline avoiding at eval due to pain    Time 6    Period Weeks    Status New    Target Date 05/20/20      PT LONG TERM GOAL #4   Title pt will be able to complete household chores and care activities for husband without being limited by pain    Baseline limited by pain at eval    Time 6    Period Weeks    Status New    Target Date 05/20/20                 Plan - 05/12/20 1752    Clinical Impression Statement gentle resistance in low ranges of motion for stability at GHJ. Cues for depression and reducing use of upper traps as primary movers. Will benefit from further periscap strengthening and coordination.    PT Treatment/Interventions ADLs/Self Care Home Management;Cryotherapy;Electrical Stimulation;Moist Heat;Iontophoresis '4mg'$ /ml Dexamethasone;Therapeutic activities;Therapeutic exercise;Patient/family education;Neuromuscular re-education;Manual techniques;Taping;Dry needling;Passive range of motion    PT Next Visit Plan prone periscap    PT Home Exercise Plan passive supine flx & ER/IR, scap retraction, upper trap &  levator stretch, pendulums, , verbal instruction for AROM forward, lateral, and sidelying,yellow band IR,ER, scap retract yellow, iso abd/ext/flx, GHJ ER yellow tband, gentle WB through UE    Consulted and Agree with Plan of Care Patient           Patient will benefit from skilled therapeutic intervention in order to improve the following deficits and impairments:  Pain, Postural dysfunction, Increased muscle spasms, Decreased activity tolerance, Decreased range of motion, Decreased strength, Impaired flexibility  Visit Diagnosis: Acute pain of right shoulder  Stiffness of right shoulder, not elsewhere classified  Muscle weakness (generalized)     Problem List Patient Active Problem List   Diagnosis Date Noted  . Right hand pain 03/25/2020  . Fracture of humeral head, right, closed 03/04/2020  . ADHD (attention deficit hyperactivity disorder) 01/29/2020  . Hypertension 01/29/2020  . Atrophic vaginitis 06/21/2017  . Migraine headache 06/13/2016  . Age-related facial wrinkles 12/04/2012  . Other specified hypertrophic and atrophic condition of skin 12/04/2012  . 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 Rosacea 12/04/2012  . Visual field defect 12/04/2012    Beaumont Austad C. Avalyn Molino PT, DPT 05/12/20 5:54 PM   [facility] [address] [place], [place], [address] Phone: [phone]   Fax:  [phone]  Name: Deborah York MRN: [id number] Date of Birth: June 23, 1956

## 2020-05-17 ENCOUNTER — Other Ambulatory Visit: Payer: Self-pay

## 2020-05-17 ENCOUNTER — Ambulatory Visit: Payer: 59 | Admitting: Physical Therapy

## 2020-05-17 ENCOUNTER — Encounter: Payer: Self-pay | Admitting: Physical Therapy

## 2020-05-17 DIAGNOSIS — M25511 Pain in right shoulder: Secondary | ICD-10-CM | POA: Diagnosis not present

## 2020-05-17 DIAGNOSIS — M25611 Stiffness of right shoulder, not elsewhere classified: Secondary | ICD-10-CM | POA: Diagnosis not present

## 2020-05-17 DIAGNOSIS — M6281 Muscle weakness (generalized): Secondary | ICD-10-CM | POA: Diagnosis not present

## 2020-05-17 NOTE — Therapy (Signed)
[place], [place], [address] Phone: [phone]   Fax:  [phone]  Physical Therapy Treatment  Patient Details  Name: Deborah York MRN: [id number] Date of Birth: May 11, 1956 Referring Provider (PT): Aundria Mems, MD   Encounter Date: 05/17/2020   PT End of Session - 05/17/20 1720    Visit Number 9    Number of Visits 13    Date for PT Re-Evaluation 05/20/20    Authorization Type UMR    PT Start Time 1718    PT Stop Time 1754    PT Time Calculation (min) 36 min    Activity Tolerance Patient tolerated treatment well    Behavior During Therapy [facility] for tasks assessed/performed           Past Medical History:  Diagnosis Date  . Hypertension   . Migraines     Past Surgical History:  Procedure Laterality Date  . TOE SURGERY      There were no vitals filed for this visit.   Subjective Assessment - 05/17/20 1720    Subjective I did yard work, I think I overdid it because it is snapping again.    Currently in Pain? Yes    Pain Score 4     Pain Location Shoulder    Pain Orientation Right    Pain Descriptors / Indicators --   snapping                            [facility] Adult PT Treatment/Exercise - 05/17/20 0001      Exercises   Exercises Shoulder      Shoulder Exercises: Stretch   Table Stretch -Flexion Limitations 10 reps on foam roller    Table Stretch - ABduction Limitations scaption x10 on foam roll    Other Shoulder Stretches ER stretch on table    Other Shoulder Stretches IR stretch towel under arm & strap behind back      Manual Therapy   Manual therapy comments GHJ PROM    Joint Mobilization prone rib & thoracic mobs    Soft tissue mobilization RC & periscapular region, pectoralis, suboccipitals, upper trap                    PT Short Term Goals - 05/05/20 1743      PT SHORT TERM GOAL #1   Title Pt will demo AROM without shoulder elevation     Baseline >110 deg    Status Achieved      PT SHORT TERM GOAL #2   Title full PROM available    Baseline limited in ER and abduction due pain (ER 60 and Abd 100)    Status Partially Met             PT Long Term Goals - 04/08/20 1141      PT LONG TERM GOAL #1   Title Pt will be independent with rest breaks and postural adjustments during long days at work    Baseline began discussing at eval, pain with documentation posture    Time 6    Period Weeks    Status New    Target Date 05/20/20      PT LONG TERM GOAL #2   Title Pt will be able to lift light weights in all ranges to begin return to long term UE exercise program    Baseline unable at eval    Time 6  Period Weeks    Status New    Target Date 05/20/20      PT LONG TERM GOAL #3   Title Pt will be able to begin yard work again    Baseline avoiding at eval due to pain    Time 6    Period Weeks    Status New    Target Date 05/20/20      PT LONG TERM GOAL #4   Title pt will be able to complete household chores and care activities for husband without being limited by pain    Baseline limited by pain at eval    Time 6    Period Weeks    Status New    Target Date 05/20/20                 Plan - 05/17/20 1755    Clinical Impression Statement Pt was very tight upon arrival to PT today after doing yard work. Most tension noted in upper trap, infraspinatus and pectoralis today and reduced with manual therapy and stretching. She reported feeling better following treatment and I advised that she should expect to be sore.    PT Treatment/Interventions ADLs/Self Care Home Management;Cryotherapy;Electrical Stimulation;Moist Heat;Iontophoresis '4mg'$ /ml Dexamethasone;Therapeutic activities;Therapeutic exercise;Patient/family education;Neuromuscular re-education;Manual techniques;Taping;Dry needling;Passive range of motion    PT Next Visit Plan try prone if not so tight    PT Home Exercise Plan passive supine flx &  ER/IR, scap retraction, upper trap & levator stretch, pendulums, , verbal instruction for AROM forward, lateral, and sidelying,yellow band IR,ER, scap retract yellow, iso abd/ext/flx, GHJ ER yellow tband, gentle WB through UE    Consulted and Agree with Plan of Care Patient           Patient will benefit from skilled therapeutic intervention in order to improve the following deficits and impairments:  Pain, Postural dysfunction, Increased muscle spasms, Decreased activity tolerance, Decreased range of motion, Decreased strength, Impaired flexibility  Visit Diagnosis: Acute pain of right shoulder  Stiffness of right shoulder, not elsewhere classified  Muscle weakness (generalized)     Problem List Patient Active Problem List   Diagnosis Date Noted  . Right hand pain 03/25/2020  . Fracture of humeral head, right, closed 03/04/2020  . ADHD (attention deficit hyperactivity disorder) 01/29/2020  . Hypertension 01/29/2020  . Atrophic vaginitis 06/21/2017  . Migraine headache 06/13/2016  . Age-related facial wrinkles 12/04/2012  . Other specified hypertrophic and atrophic condition of skin 12/04/2012  . 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 Rosacea 12/04/2012  . Visual field defect 12/04/2012    Nilam Quakenbush C. Dimonique Bourdeau PT, DPT 05/17/20 5:57 PM   [facility] [address] [place], [place], [address] Phone: [phone]   Fax:  [phone]  Name: Deborah York MRN: [id number] Date of Birth: Oct 07, 1956

## 2020-05-18 MED FILL — LINZESS 290 MCG CAPSULE: 290 | 90 days supply | Qty: 90 | Fill #1

## 2020-05-18 MED FILL — METHYLPHENIDATE 10 MG TAB: 10 | 30 days supply | Qty: 90 | Fill #0

## 2020-05-19 ENCOUNTER — Ambulatory Visit: Payer: 59 | Attending: Sports Medicine | Admitting: Physical Therapy

## 2020-05-19 ENCOUNTER — Encounter: Payer: Self-pay | Admitting: Physical Therapy

## 2020-05-19 ENCOUNTER — Other Ambulatory Visit: Payer: Self-pay

## 2020-05-19 DIAGNOSIS — S42291D Other displaced fracture of upper end of right humerus, subsequent encounter for fracture with routine healing: Secondary | ICD-10-CM

## 2020-05-19 DIAGNOSIS — M6281 Muscle weakness (generalized): Secondary | ICD-10-CM | POA: Diagnosis present

## 2020-05-19 DIAGNOSIS — M25511 Pain in right shoulder: Secondary | ICD-10-CM | POA: Diagnosis present

## 2020-05-19 DIAGNOSIS — M25611 Stiffness of right shoulder, not elsewhere classified: Secondary | ICD-10-CM | POA: Insufficient documentation

## 2020-05-19 NOTE — Therapy (Signed)
[place], [place], [address] Phone: [phone]   Fax:  [phone]  Physical Therapy Treatment  Patient Details  Name: Deborah York MRN: [id number] Date of Birth: Mar 27, 1956 Referring Provider (PT): Aundria Mems, MD   Encounter Date: 05/19/2020   PT End of Session - 05/19/20 1708    Visit Number 10    Number of Visits 13    Date for PT Re-Evaluation 07/15/20    Authorization Type UMR    PT Start Time 1706    PT Stop Time 1751    PT Time Calculation (min) 45 min    Activity Tolerance Patient tolerated treatment well    Behavior During Therapy [facility] for tasks assessed/performed           Past Medical History:  Diagnosis Date  . Hypertension   . Migraines     Past Surgical History:  Procedure Laterality Date  . TOE SURGERY      There were no vitals filed for this visit.   Subjective Assessment - 05/19/20 1708    Subjective Snapping came back a little but not as bad.    Patient Stated Goals ROM, yard work- dirt/mulch bags, decrease pain, exercise- free weights, assist husband post CVA    Currently in Pain? 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 depression of Rt GHJ      AROM   Overall AROM Comments ROM without shoulder hike    Right Shoulder Flexion 108 Degrees    Right Shoulder ABduction 100 Degrees      PROM   Right Shoulder Flexion 140 Degrees    Right Shoulder ABduction 130 Degrees    Right Shoulder Internal Rotation 56 Degrees    Right Shoulder External Rotation 64 Degrees      Strength   Overall Strength Comments gross 4+/5 below 90 deg                         [facility] Adult PT Treatment/Exercise - 05/19/20 0001      Shoulder Exercises: Seated   Other Seated Exercises scapular retraction      Shoulder Exercises: Standing   Other Standing Exercises wall slides- flexion & scaption      Manual Therapy   Manual Therapy Passive ROM    Manual therapy comments skilled palpation & monitoring during TPDN    Soft tissue mobilization Rt upper trap, infraspinatus    Passive ROM GHJ stretching, all directions  Trigger Point Dry Needling - 05/19/20 0001    Consent Given? Yes    Education Handout Provided --   verbal education   Muscles Treated Head and Neck Upper trapezius    Muscles Treated Upper Quadrant Infraspinatus    Muscles Treated Wrist/Hand --    Upper Trapezius Response Twitch reponse elicited;Palpable increased muscle length   Rt   Infraspinatus Response Twitch response elicited;Palpable increased muscle length   Rt               PT Education - 05/19/20 1807    Education Details goals, FOTO, POC, HEP, DN & expected outcomes    Person(s) Educated Patient    Methods Explanation    Comprehension Verbalized understanding;Need further instruction            PT Short Term Goals - 05/19/20 1806      PT SHORT TERM GOAL #1   Title Pt will demo AROM without shoulder elevation    Status Achieved      PT SHORT TERM GOAL #2   Title full PROM available    Baseline mild limitations,see flowsheet    Status On-going             PT Long Term  Goals - 05/19/20 1735      PT LONG TERM GOAL #1   Title Pt will be independent with rest breaks and postural adjustments during long days at work    Baseline mostly but sometimes not    Status On-going      PT LONG TERM GOAL #2   Title Pt will be able to lift light weights in all ranges to begin return to long term UE exercise program    Baseline limited strength, requires further training    Status On-going      PT LONG TERM GOAL #3   Title Pt will be able to begin yard work again    Baseline tried but experienced significant increase in pain    Status On-going      PT LONG TERM GOAL #4   Title pt will be able to complete household chores and care activities for husband without being limited by pain    Baseline avoids lifting and heavy activities due to strength limitations    Status On-going                 Plan - 05/19/20 1746    Clinical Impression Statement Pt is making improvement in flexibility as well as controlled motion of arm. while AROM has decreased slightly, scapular movement and shoulder hike are much better controlled. POC dates will be extended as she would like to move to a very high level of function which will require further strengthening. Pt will benefit from continued skilled PT in order to progress toward LTGs.    PT Frequency 2x / week    PT Duration 8 weeks    PT Treatment/Interventions ADLs/Self Care Home Management;Cryotherapy;Electrical Stimulation;Moist Heat;Iontophoresis 4mg /ml Dexamethasone;Therapeutic activities;Therapeutic exercise;Patient/family education;Neuromuscular re-education;Manual techniques;Taping;Dry needling;Passive range of motion    PT Next Visit Plan triceps, prone periscap    PT Home Exercise Plan passive supine flx & ER/IR, scap retraction, upper trap & levator stretch, pendulums, , verbal instruction for AROM forward, lateral, and sidelying,yellow band IR,ER, scap retract yellow, iso abd/ext/flx, GHJ ER yellow tband, gentle WB  through UE, wall slides    Consulted and Agree with Plan of Care Patient           Patient will benefit  from skilled therapeutic intervention in order to improve the following deficits and impairments:  Pain, Postural dysfunction, Increased muscle spasms, Decreased activity tolerance, Decreased range of motion, Decreased strength, Impaired flexibility  Visit Diagnosis: Acute pain of right shoulder - Plan: PT plan of care cert/re-cert  Stiffness of right shoulder, not elsewhere classified - Plan: PT plan of care cert/re-cert  Muscle weakness (generalized) - Plan: PT plan of care cert/re-cert     Problem List Patient Active Problem List   Diagnosis Date Noted  . Right hand pain 03/25/2020  . Fracture of humeral head, right, closed 03/04/2020  . ADHD (attention deficit hyperactivity disorder) 01/29/2020  . Hypertension 01/29/2020  . Atrophic vaginitis 06/21/2017  . Migraine headache 06/13/2016  . Age-related facial wrinkles 12/04/2012  . Other specified hypertrophic and atrophic condition of skin 12/04/2012  . 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 Rosacea 12/04/2012  . Visual field defect 12/04/2012  Akshay Spang C. Alithia Zavaleta PT, DPT 05/19/20 6:12 PM   [place], [place], [address] Phone: [phone]   Fax:  [phone]  Name: Deborah York MRN: [id number] Date of Birth: Oct 07, 1956

## 2020-05-26 ENCOUNTER — Encounter: Payer: Self-pay | Admitting: Physical Therapy

## 2020-05-26 ENCOUNTER — Ambulatory Visit: Payer: 59 | Admitting: Physical Therapy

## 2020-05-26 ENCOUNTER — Other Ambulatory Visit: Payer: Self-pay

## 2020-05-26 DIAGNOSIS — M6281 Muscle weakness (generalized): Secondary | ICD-10-CM

## 2020-05-26 DIAGNOSIS — M25511 Pain in right shoulder: Secondary | ICD-10-CM | POA: Diagnosis not present

## 2020-05-26 DIAGNOSIS — M25611 Stiffness of right shoulder, not elsewhere classified: Secondary | ICD-10-CM | POA: Diagnosis not present

## 2020-05-27 NOTE — Therapy (Signed)
[place], [place], [address] Phone: [phone]   Fax:  [phone]  Physical Therapy Treatment  Patient Details  Name: Deborah York MRN: [id number] Date of Birth: May 26, 1956 Referring Provider (PT): Aundria Mems, MD   Encounter Date: 05/26/2020   PT End of Session - 05/26/20 1720    Visit Number 11    Number of Visits 13    Date for PT Re-Evaluation 07/15/20    Authorization Type UMR    PT Start Time 4403    PT Stop Time 1755    PT Time Calculation (min) 38 min    Activity Tolerance Patient tolerated treatment well    Behavior During Therapy [facility] for tasks assessed/performed           Past Medical History:  Diagnosis Date  . Hypertension   . Migraines     Past Surgical History:  Procedure Laterality Date  . TOE SURGERY      There were no vitals filed for this visit.   Subjective Assessment - 05/26/20 1719    Subjective It's just tight.    Currently in Pain? Yes    Pain Score 4     Pain Location Shoulder    Pain Orientation Right    Pain Descriptors / Indicators Tightness                             [facility] Adult PT Treatment/Exercise - 05/26/20 0001      Shoulder Exercises: Supine   Flexion 10 reps    Flexion Limitations full arc with wand      Shoulder Exercises: Prone   Extension 10 reps;Right   3 sets   Other Prone Exercises row 3x10      Shoulder Exercises: Sidelying   External Rotation Right;15 reps;AROM    Flexion 15 reps;AROM      Manual Therapy   Manual therapy comments GHJ stretching all motions    Passive ROM suboccipitals with traction, bil upper traps                    PT Short Term Goals - 05/19/20 1806      PT SHORT TERM GOAL #1   Title Pt will demo AROM without shoulder elevation    Status Achieved      PT SHORT TERM GOAL #2   Title full PROM available    Baseline mild limitations,see flowsheet    Status On-going               PT Long Term Goals - 05/19/20 1735      PT LONG TERM GOAL #1   Title Pt will be independent with rest breaks and postural adjustments during long days at work    Baseline mostly but sometimes not    Status On-going      PT LONG TERM GOAL #2   Title Pt will be able to lift light weights in all ranges to begin return to long term UE exercise program    Baseline limited strength, requires further training    Status On-going      PT LONG TERM GOAL #3   Title Pt will be able to begin yard work again    Baseline tried but experienced significant increase in pain    Status On-going      PT LONG TERM GOAL #4   Title pt will be able to complete household  chores and care activities for husband without being limited by pain    Baseline avoids lifting and heavy activities due to strength limitations    Status On-going                 Plan - 05/27/20 0708    Clinical Impression Statement progressed strengthening exercises in gravity-reduced positions with fatigue reported but no pain. sidelying ER able to lift through approx 25% of available AROM- assistance provided to lift and pt controlled eccentrically.    PT Treatment/Interventions ADLs/Self Care Home Management;Cryotherapy;Electrical Stimulation;Moist Heat;Iontophoresis 4mg /ml Dexamethasone;Therapeutic activities;Therapeutic exercise;Patient/family education;Neuromuscular re-education;Manual techniques;Taping;Dry needling;Passive range of motion    PT Next Visit Plan triceps, continue prone & gravity reduced    PT Home Exercise Plan passive supine flx & ER/IR, scap retraction, upper trap & levator stretch, pendulums, , verbal instruction for AROM forward, lateral, and sidelying,yellow band IR,ER, scap retract yellow, iso abd/ext/flx, GHJ ER yellow tband, gentle WB through UE, wall slides    Consulted and Agree with Plan of Care Patient           Patient will benefit from skilled therapeutic intervention in order  to improve the following deficits and impairments:  Pain, Postural dysfunction, Increased muscle spasms, Decreased activity tolerance, Decreased range of motion, Decreased strength, Impaired flexibility  Visit Diagnosis: Acute pain of right shoulder  Stiffness of right shoulder, not elsewhere classified  Muscle weakness (generalized)     Problem List Patient Active Problem List   Diagnosis Date Noted  . Right hand pain 03/25/2020  . Fracture of humeral head, right, closed 03/04/2020  . ADHD (attention deficit hyperactivity disorder) 01/29/2020  . Hypertension 01/29/2020  . Atrophic vaginitis 06/21/2017  . Migraine headache 06/13/2016  . Age-related facial wrinkles 12/04/2012  . Other specified hypertrophic and atrophic condition of skin 12/04/2012  . 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 Rosacea 12/04/2012  . Visual field defect 12/04/2012    Trinika Cortese C. Miqueas Whilden PT, DPT 05/27/20 7:18 AM   [place], [place], [address] Phone: [phone]   Fax:  [phone]  Name: Deborah York MRN: [id number] Date of Birth: 1956/05/11

## 2020-06-01 ENCOUNTER — Other Ambulatory Visit (HOSPITAL_BASED_OUTPATIENT_CLINIC_OR_DEPARTMENT_OTHER): Payer: Self-pay | Admitting: Family Medicine

## 2020-06-01 MED FILL — INTRAROSA 6.5 MG VAG INSERT: 6.5 | 28 days supply | Qty: 28 | Fill #1

## 2020-06-01 MED FILL — SUMATRIPTAN SUCC 100 MG TAB: 100 | 80 days supply | Qty: 24 | Fill #0

## 2020-06-03 ENCOUNTER — Ambulatory Visit (INDEPENDENT_AMBULATORY_CARE_PROVIDER_SITE_OTHER): Payer: 59

## 2020-06-03 ENCOUNTER — Ambulatory Visit: Payer: 59 | Admitting: Physical Therapy

## 2020-06-03 ENCOUNTER — Ambulatory Visit: Payer: 59 | Admitting: Sports Medicine

## 2020-06-03 ENCOUNTER — Other Ambulatory Visit: Payer: Self-pay

## 2020-06-03 ENCOUNTER — Encounter: Payer: Self-pay | Admitting: Physical Therapy

## 2020-06-03 DIAGNOSIS — S42291D Other displaced fracture of upper end of right humerus, subsequent encounter for fracture with routine healing: Secondary | ICD-10-CM

## 2020-06-03 DIAGNOSIS — M19011 Primary osteoarthritis, right shoulder: Secondary | ICD-10-CM | POA: Diagnosis not present

## 2020-06-03 DIAGNOSIS — M25611 Stiffness of right shoulder, not elsewhere classified: Secondary | ICD-10-CM | POA: Diagnosis not present

## 2020-06-03 DIAGNOSIS — M6281 Muscle weakness (generalized): Secondary | ICD-10-CM | POA: Diagnosis not present

## 2020-06-03 DIAGNOSIS — M25511 Pain in right shoulder: Secondary | ICD-10-CM | POA: Diagnosis not present

## 2020-06-03 NOTE — Progress Notes (Signed)
    Procedures performed today:    None.  Independent interpretation of notes and tests performed by another provider:   None.  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 She will continue PT for another 6 to 8 weeks, and if she continues to have difficulty with her motion we will probably do a glenohumeral injection +/- MRI to evaluate her cuff. Today her supraspinatus testing was normal. She is still currently not quite at goal.    ___________________________________________ Gwen Her. Dianah Field, M.D., ABFM., CAQSM. Primary Care and [place] Instructor of [place] [facility]

## 2020-06-03 NOTE — Therapy (Signed)
[place], [place], [address] Phone: [phone]   Fax:  [phone]  Physical Therapy Treatment  Patient Details  Name: Deborah York MRN: [id number] Date of Birth: 1956/10/16 Referring Provider (PT): Aundria Mems, MD   Encounter Date: 06/03/2020   PT End of Session - 06/03/20 1104    Visit Number 12    Number of Visits 13    Date for PT Re-Evaluation 07/15/20    Authorization Type UMR    PT Start Time 1102    PT Stop Time 1145    PT Time Calculation (min) 43 min           Past Medical History:  Diagnosis Date  . Hypertension   . Migraines     Past Surgical History:  Procedure Laterality Date  . TOE SURGERY      There were no vitals filed for this visit.   Subjective Assessment - 06/03/20 1104    Subjective I had an xray this morning and they increased my pain. 1/10 pain now. I dont have to take any meds for pain.    Currently in Pain? No/denies              [facility] PT Assessment - 06/03/20 0001      AROM   Overall AROM Comments ROM without shoulder hike    Right Shoulder Flexion 120 Degrees    Right Shoulder ABduction 110 Degrees                         [facility] Adult PT Treatment/Exercise - 06/03/20 0001      Shoulder Exercises: Supine   Internal Rotation 15 reps    Theraband Level (Shoulder Internal Rotation) Level 1 (Yellow)    Flexion 10 reps    Flexion Limitations full arc with wand    Other Supine Exercises horizontal abduction x 15 yellow, bilat ER yellow x 15       Shoulder Exercises: Prone   Extension 10 reps;Right   3 sets   Other Prone Exercises row 3x10      Shoulder Exercises: Sidelying   External Rotation Right;15 reps;AROM    Flexion 15 reps;AROM    ABduction AROM;15 reps      Shoulder Exercises: Standing   Other Standing Exercises wall slides- flexion & scaption    Other Standing Exercises yellow band tricep extension in doorway,  yellow band bicep curl standing       Shoulder Exercises: Pulleys   Flexion --      Shoulder Exercises: ROM/Strengthening   UBE (Upper Arm Bike) L1 forward and retro x 2 minutes each way                     PT Short Term Goals - 05/19/20 1806      PT SHORT TERM GOAL #1   Title Pt will demo AROM without shoulder elevation    Status Achieved      PT SHORT TERM GOAL #2   Title full PROM available    Baseline mild limitations,see flowsheet    Status On-going             PT Long Term Goals - 05/19/20 1735      PT LONG TERM GOAL #1   Title Pt will be independent with rest breaks and postural adjustments during long days at work    Baseline mostly but sometimes not    Status On-going  PT LONG TERM GOAL #2   Title Pt will be able to lift light weights in all ranges to begin return to long term UE exercise program    Baseline limited strength, requires further training    Status On-going      PT LONG TERM GOAL #3   Title Pt will be able to begin yard work again    Baseline tried but experienced significant increase in pain    Status On-going      PT LONG TERM GOAL #4   Title pt will be able to complete household chores and care activities for husband without being limited by pain    Baseline avoids lifting and heavy activities due to strength limitations    Status On-going                 Plan - 06/03/20 1258    Clinical Impression Statement Pt's overhead AROM has improved. She is working on her new exercises and requests a bicep strengthening exercise. She was given yellow band for bicep curls and instructed how to also perform tricep extension in doorway. Continued with AROM and gentle yellow band strengthening and she tolerated session well with fatigue.    PT Next Visit Plan triceps, continue prone & gravity reduced    PT Home Exercise Plan passive supine flx & ER/IR, scap retraction, upper trap & levator stretch, pendulums, , verbal instruction  for AROM forward, lateral, and sidelying,yellow band IR,ER, scap retract yellow, iso abd/ext/flx, GHJ ER yellow tband, gentle WB through UE, wall slides           Patient will benefit from skilled therapeutic intervention in order to improve the following deficits and impairments:  Pain, Postural dysfunction, Increased muscle spasms, Decreased activity tolerance, Decreased range of motion, Decreased strength, Impaired flexibility  Visit Diagnosis: Stiffness of right shoulder, not elsewhere classified  Acute pain of right shoulder  Muscle weakness (generalized)     Problem List Patient Active Problem List   Diagnosis Date Noted  . Right hand pain 03/25/2020  . Fracture of humeral head, right, closed 03/04/2020  . ADHD (attention deficit hyperactivity disorder) 01/29/2020  . Hypertension 01/29/2020  . Atrophic vaginitis 06/21/2017  . Migraine headache 06/13/2016  . Age-related facial wrinkles 12/04/2012  . Other specified hypertrophic and atrophic condition of skin 12/04/2012  . 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 Rosacea 12/04/2012  . Visual field defect 12/04/2012    Dorene Ar, PTA 06/03/2020, 1:01 PM  [facility] [address] [place], [place], [address] Phone: [phone]   Fax:  [phone]  Name: Deborah York MRN: [id number] Date of Birth: April 25, 1956

## 2020-06-03 NOTE — Assessment & Plan Note (Addendum)
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 She will continue PT for another 6 to 8 weeks, and if she continues to have difficulty with her motion we will probably do a glenohumeral injection +/- MRI to evaluate her cuff. Today her supraspinatus testing was normal. She is still currently not quite at goal.

## 2020-06-10 ENCOUNTER — Encounter: Payer: 59 | Admitting: Physical Therapy

## 2020-06-13 ENCOUNTER — Encounter: Payer: 59 | Admitting: Physical Therapy

## 2020-06-16 ENCOUNTER — Other Ambulatory Visit: Payer: Self-pay

## 2020-06-16 ENCOUNTER — Ambulatory Visit: Payer: 59 | Admitting: Physical Therapy

## 2020-06-16 ENCOUNTER — Encounter: Payer: Self-pay | Admitting: Physical Therapy

## 2020-06-16 DIAGNOSIS — M6281 Muscle weakness (generalized): Secondary | ICD-10-CM

## 2020-06-16 DIAGNOSIS — M25611 Stiffness of right shoulder, not elsewhere classified: Secondary | ICD-10-CM

## 2020-06-16 DIAGNOSIS — M25511 Pain in right shoulder: Secondary | ICD-10-CM | POA: Diagnosis not present

## 2020-06-16 MED FILL — METHYLPHENIDATE 10 MG TAB: 10 | 30 days supply | Qty: 90 | Fill #0

## 2020-06-16 NOTE — Patient Instructions (Signed)
Upper Back Strength: Lower Trapezius / Rotator Cuff    Arms in waitress pose, palms up. Press hands back and slide shoulder blades down. Hold for ____ breaths. Repeat ____ times.  Copyright  VHI. All rights reserved.

## 2020-06-16 NOTE — Therapy (Signed)
[place], [place], [address] Phone: [phone]   Fax:  [phone]  Physical Therapy Treatment  Patient Details  Name: Deborah York MRN: [id number] Date of Birth: [birth year] Referring Provider (PT): Aundria Mems, MD   Encounter Date: 06/16/2020   PT End of Session - 06/16/20 1704    Visit Number 13    Number of Visits 13    Date for PT Re-Evaluation 07/15/20    Authorization Type UMR    PT Start Time 1700    PT Stop Time 1755    PT Time Calculation (min) 55 min    Activity Tolerance Patient tolerated treatment well    Behavior During Therapy [facility] for tasks assessed/performed           Past Medical History:  Diagnosis Date  . Hypertension   . Migraines     Past Surgical History:  Procedure Laterality Date  . TOE SURGERY      There were no vitals filed for this visit.   Subjective Assessment - 06/16/20 1703    Subjective No pain at rest, I'm getting stiffer and stronger.    Currently in Pain? No/denies              [facility] Adult PT Treatment/Exercise - 06/16/20 0001      Pilates   Pilates Reformer Prone overhead press 1 red x 10 and then 1 blue for single arm , Pulling straps x 10 and triceps x 10       Shoulder Exercises: Seated   Other Seated Exercises lower trap press up on mat with yoga blocks 2 x 10      Shoulder Exercises: Standing   Other Standing Exercises 1/2 circle pull with red band x 10 each side "clock"     Other Standing Exercises bicep curls 3 lbs x 15, hammer curl x 15 and front raise to 90 deg 1-3lbs x 10       Shoulder Exercises: Stretch   Wall Stretch - Flexion Limitations wall ladder flexion and scaption x 3     Wall Stretch - ABduction Limitations abd and ER combined in doorway x 5                   PT Education - 06/16/20 1756    Education Details upper vs lower trap    Person(s) Educated Patient    Methods Explanation;Demonstration     Comprehension Verbalized understanding            PT Short Term Goals - 05/19/20 1806      PT SHORT TERM GOAL #1   Title Pt will demo AROM without shoulder elevation    Status Achieved      PT SHORT TERM GOAL #2   Title full PROM available    Baseline mild limitations,see flowsheet    Status On-going             PT Long Term Goals - 05/19/20 1735      PT LONG TERM GOAL #1   Title Pt will be independent with rest breaks and postural adjustments during long days at work    Baseline mostly but sometimes not    Status On-going      PT LONG TERM GOAL #2   Title Pt will be able to lift light weights in all ranges to begin return to long term UE exercise program    Baseline limited strength, requires further training  Status On-going      PT LONG TERM GOAL #3   Title Pt will be able to begin yard work again    Baseline tried but experienced significant increase in pain    Status On-going      PT LONG TERM GOAL #4   Title pt will be able to complete household chores and care activities for husband without being limited by pain    Baseline avoids lifting and heavy activities due to strength limitations    Status On-going                 Plan - 06/16/20 1757    Clinical Impression Statement Worked in gravity reduced positions for flexion of Rt UE and prone on Reformer.  Prone revealed significant weakness in Rt lower trap and poor head, neck and shoulder organization. Patient with limitations in lifting arm >90 deg, able to do effectively with 1 lbs x 15 keeping proper form.  Is pleased with her progress and that her pain is more intermittent.  She will cont to benefit from PT for strengthening and mobility for her Rt UE .    PT Treatment/Interventions ADLs/Self Care Home Management;Cryotherapy;Electrical Stimulation;Moist Heat;Iontophoresis 4mg /ml Dexamethasone;Therapeutic activities;Therapeutic exercise;Patient/family education;Neuromuscular re-education;Manual  techniques;Taping;Dry needling;Passive range of motion    PT Next Visit Plan triceps, continue prone & gravity reduced    PT Home Exercise Plan passive supine flx & ER/IR, scap retraction, upper trap & levator stretch, pendulums, , verbal instruction for AROM forward, lateral, and sidelying,yellow band IR,ER, scap retract yellow, iso abd/ext/flx, GHJ ER yellow tband, gentle WB through UE, wall slides           Patient will benefit from skilled therapeutic intervention in order to improve the following deficits and impairments:  Pain, Postural dysfunction, Increased muscle spasms, Decreased activity tolerance, Decreased range of motion, Decreased strength, Impaired flexibility  Visit Diagnosis: Stiffness of right shoulder, not elsewhere classified  Acute pain of right shoulder  Muscle weakness (generalized)     Problem List Patient Active Problem List   Diagnosis Date Noted  . Right hand pain 03/25/2020  . Fracture of humeral head, right, closed 03/04/2020  . ADHD (attention deficit hyperactivity disorder) 01/29/2020  . Hypertension 01/29/2020  . Atrophic vaginitis 06/21/2017  . Migraine headache 06/13/2016  . Age-related facial wrinkles 12/04/2012  . Other specified hypertrophic and atrophic condition of skin 12/04/2012  . 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 Rosacea 12/04/2012  . Visual field defect 12/04/2012    Deborah York 06/16/2020, 6:06 PM  [facility] [address] [place], [place], [address] Phone: [phone]   Fax:  [phone]  Name: Deborah York MRN: [id number] Date of Birth: 03/15/1956  Raeford Razor, PT 06/16/20 6:06 PM Phone: [phone] Fax: [phone]

## 2020-06-20 ENCOUNTER — Encounter: Payer: 59 | Admitting: Physical Therapy

## 2020-06-23 ENCOUNTER — Encounter: Payer: Self-pay | Admitting: Physical Therapy

## 2020-06-23 ENCOUNTER — Ambulatory Visit: Payer: 59 | Attending: Sports Medicine | Admitting: Physical Therapy

## 2020-06-23 ENCOUNTER — Other Ambulatory Visit: Payer: Self-pay

## 2020-06-23 DIAGNOSIS — M25511 Pain in right shoulder: Secondary | ICD-10-CM | POA: Insufficient documentation

## 2020-06-23 DIAGNOSIS — M25611 Stiffness of right shoulder, not elsewhere classified: Secondary | ICD-10-CM | POA: Insufficient documentation

## 2020-06-23 DIAGNOSIS — M6281 Muscle weakness (generalized): Secondary | ICD-10-CM | POA: Diagnosis present

## 2020-06-23 NOTE — Therapy (Signed)
[place], [place], [address] Phone: [phone]   Fax:  [phone]  Physical Therapy Treatment  Patient Details  Name: Deborah York MRN: [id number] Date of Birth: 1956/04/27 Referring Provider (PT): Aundria Mems, MD   Encounter Date: 06/23/2020   PT End of Session - 06/23/20 1708    Visit Number 14    Date for PT Re-Evaluation 07/15/20    Authorization Type UMR    PT Start Time 1705    PT Stop Time 1755    PT Time Calculation (min) 50 min    Activity Tolerance Patient tolerated treatment well    Behavior During Therapy [facility] for tasks assessed/performed           Past Medical History:  Diagnosis Date  . Hypertension   . Migraines     Past Surgical History:  Procedure Laterality Date  . TOE SURGERY      There were no vitals filed for this visit.   Subjective Assessment - 06/23/20 1706    Subjective Have a little catch in it.  No pain really.  Limited in ROM more than strength, sees MD on 9/16 .    Currently in Pain? No/denies             [facility] Adult PT Treatment/Exercise - 06/23/20 0001      Shoulder Exercises: Prone   Retraction Both;10 reps    Extension Both;10 reps    External Rotation Strengthening;Right;10 reps    External Rotation Weight (lbs) prone goal post     Horizontal ABduction 1 Strengthening;Both;10 reps    Horizontal ABduction 1 Weight (lbs) low V palms  down     Horizontal ABduction 2 Limitations T lift x 10 palms down       Shoulder Exercises: Standing   External Rotation Strengthening;Right;10 reps    External Rotation Limitations freemotion 1 plate with L UE assist     Diagonals Limitations small ROM 1 plate freemotion added full elbow extension with mod. x 10 each       Shoulder Exercises: Pulleys   Flexion 3 minutes    Scaption 2 minutes    Other Pulley Exercises IR 2 min standing       Shoulder Exercises: ROM/Strengthening   Ranger UE Ranger  flexion,  scaption       Moist Heat Therapy   Number Minutes Moist Heat 10 Minutes    Moist Heat Location Shoulder      Manual Therapy   Joint Mobilization Inferior glides GH joint, scapular mobilization     Passive ROM all planes to tolerance                     PT Short Term Goals - 05/19/20 1806      PT SHORT TERM GOAL #1   Title Pt will demo AROM without shoulder elevation    Status Achieved      PT SHORT TERM GOAL #2   Title full PROM available    Baseline mild limitations,see flowsheet    Status On-going             PT Long Term Goals - 05/19/20 1735      PT LONG TERM GOAL #1   Title Pt will be independent with rest breaks and postural adjustments during long days at work    Baseline mostly but sometimes not    Status On-going      PT LONG TERM GOAL #2  Title Pt will be able to lift light weights in all ranges to begin return to long term UE exercise program    Baseline limited strength, requires further training    Status On-going      PT LONG TERM GOAL #3   Title Pt will be able to begin yard work again    Baseline tried but experienced significant increase in pain    Status On-going      PT LONG TERM GOAL #4   Title pt will be able to complete household chores and care activities for husband without being limited by pain    Baseline avoids lifting and heavy activities due to strength limitations    Status On-going                 Plan - 06/23/20 1749    Clinical Impression Statement Manual therapy offered in prone for ER/IR and capsular stretching.  Patient most limited in overhead reaching, strength is improving and she feels that functionally. Will cont to benefit from skilled PT intervention for improving ROM and functional strength    PT Treatment/Interventions ADLs/Self Care Home Management;Cryotherapy;Electrical Stimulation;Moist Heat;Iontophoresis 4mg /ml Dexamethasone;Therapeutic activities;Therapeutic exercise;Patient/family  education;Neuromuscular re-education;Manual techniques;Taping;Dry needling;Passive range of motion    PT Next Visit Plan triceps, continue prone & gravity reduced    PT Home Exercise Plan passive supine flx & ER/IR, scap retraction, upper trap & levator stretch, pendulums, , verbal instruction for AROM forward, lateral, and sidelying,yellow band IR,ER, scap retract yellow, iso abd/ext/flx, GHJ ER yellow tband, gentle WB through UE, wall slides    Consulted and Agree with Plan of Care Patient           Patient will benefit from skilled therapeutic intervention in order to improve the following deficits and impairments:  Pain, Postural dysfunction, Increased muscle spasms, Decreased activity tolerance, Decreased range of motion, Decreased strength, Impaired flexibility  Visit Diagnosis: Stiffness of right shoulder, not elsewhere classified  Acute pain of right shoulder  Muscle weakness (generalized)     Problem List Patient Active Problem List   Diagnosis Date Noted  . Right hand pain 03/25/2020  . Fracture of humeral head, right, closed 03/04/2020  . ADHD (attention deficit hyperactivity disorder) 01/29/2020  . Hypertension 01/29/2020  . Atrophic vaginitis 06/21/2017  . Migraine headache 06/13/2016  . Age-related facial wrinkles 12/04/2012  . Other specified hypertrophic and atrophic condition of skin 12/04/2012  . 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 Rosacea 12/04/2012  . Visual field defect 12/04/2012    Deborah York 06/23/2020, 5:55 PM  [facility] [address] [place], [place], [address] Phone: [phone]   Fax:  [phone]  Name: Deborah York MRN: [id number] Date of Birth: [birth year]  Raeford Razor, PT 06/23/20 5:55 PM Phone: [phone] Fax: [phone]

## 2020-06-27 ENCOUNTER — Ambulatory Visit: Payer: 59 | Admitting: Physical Therapy

## 2020-06-27 ENCOUNTER — Encounter: Payer: Self-pay | Admitting: Physical Therapy

## 2020-06-27 ENCOUNTER — Other Ambulatory Visit: Payer: Self-pay

## 2020-06-27 DIAGNOSIS — M25511 Pain in right shoulder: Secondary | ICD-10-CM

## 2020-06-27 DIAGNOSIS — M25611 Stiffness of right shoulder, not elsewhere classified: Secondary | ICD-10-CM

## 2020-06-27 DIAGNOSIS — M6281 Muscle weakness (generalized): Secondary | ICD-10-CM | POA: Diagnosis not present

## 2020-06-27 NOTE — Therapy (Signed)
[place], [place], [address] Phone: [phone]   Fax:  [phone]  Physical Therapy Treatment  Patient Details  Name: Deborah York MRN: [id number] Date of Birth: 1956-01-01 Referring Provider (PT): Aundria Mems, MD   Encounter Date: 06/27/2020   PT End of Session - 06/27/20 1709    Visit Number 15    Date for PT Re-Evaluation 07/15/20    Authorization Type UMR    PT Start Time 1705    PT Stop Time 1745    PT Time Calculation (min) 40 min    Activity Tolerance Patient tolerated treatment well    Behavior During Therapy [facility] for tasks assessed/performed           Past Medical History:  Diagnosis Date  . Hypertension   . Migraines     Past Surgical History:  Procedure Laterality Date  . TOE SURGERY      There were no vitals filed for this visit.   Subjective Assessment - 06/27/20 1708    Subjective I thnk I overdid it a little- just did a small amt of yard work. anterior aspect where fx was hurts.    Currently in Pain? Yes    Pain Score 4     Pain Location Shoulder    Pain Orientation Right    Pain Descriptors / Indicators Sore    Aggravating Factors  overdoing it    Pain Relieving Factors rest                             [facility] Adult PT Treatment/Exercise - 06/27/20 0001      Shoulder Exercises: Supine   Protraction Right;20 reps    Flexion Limitations began with yellow band and then to AROM on incline    Other Supine Exercises rythmic stabs 5x30s      Shoulder Exercises: Sidelying   External Rotation Limitations PT anchoring scapula    ABduction Limitations to 90 PT assisting scapular movement    Other Sidelying Exercises retraction resisted by PT    Other Sidelying Exercises horiz abd at 90- PT assisted scapular movement      Shoulder Exercises: Standing   Other Standing Exercises triceps ext 1lb each hand      Shoulder Exercises: ROM/Strengthening    UBE (Upper Arm Bike) retro 3 min L1      Manual Therapy   Manual Therapy Taping    Passive ROM stretching focus on flexion and ER    Kinesiotex Facilitate Muscle      Kinesiotix   Facilitate Muscle  rt deltoid                    PT Short Term Goals - 05/19/20 1806      PT SHORT TERM GOAL #1   Title Pt will demo AROM without shoulder elevation    Status Achieved      PT SHORT TERM GOAL #2   Title full PROM available    Baseline mild limitations,see flowsheet    Status On-going             PT Long Term Goals - 05/19/20 1735      PT LONG TERM GOAL #1   Title Pt will be independent with rest breaks and postural adjustments during long days at work    Baseline mostly but sometimes not    Status On-going      PT LONG  TERM GOAL #2   Title Pt will be able to lift light weights in all ranges to begin return to long term UE exercise program    Baseline limited strength, requires further training    Status On-going      PT LONG TERM GOAL #3   Title Pt will be able to begin yard work again    Baseline tried but experienced significant increase in pain    Status On-going      PT LONG TERM GOAL #4   Title pt will be able to complete household chores and care activities for husband without being limited by pain    Baseline avoids lifting and heavy activities due to strength limitations    Status On-going                 Plan - 06/27/20 1751    Clinical Impression Statement PROM is near-full and pt reports a stretch sensation at end range. Still lacking some strength to reach completely OH so gravity reduced exercises were utilized. PT assist in scaular retraciton required and decreased pinching pain.    PT Treatment/Interventions ADLs/Self Care Home Management;Cryotherapy;Electrical Stimulation;Moist Heat;Iontophoresis 4mg /ml Dexamethasone;Therapeutic activities;Therapeutic exercise;Patient/family education;Neuromuscular re-education;Manual  techniques;Taping;Dry needling;Passive range of motion    PT Next Visit Plan cont sidelying & supine motion    PT Home Exercise Plan passive supine flx & ER/IR, scap retraction, upper trap & levator stretch, pendulums, , verbal instruction for AROM forward, lateral, and sidelying,yellow band IR,ER, scap retract yellow, iso abd/ext/flx, GHJ ER yellow tband, gentle WB through UE, wall slides    Consulted and Agree with Plan of Care Patient           Patient will benefit from skilled therapeutic intervention in order to improve the following deficits and impairments:  Pain, Postural dysfunction, Increased muscle spasms, Decreased activity tolerance, Decreased range of motion, Decreased strength, Impaired flexibility  Visit Diagnosis: Stiffness of right shoulder, not elsewhere classified  Acute pain of right shoulder  Muscle weakness (generalized)     Problem List Patient Active Problem List   Diagnosis Date Noted  . Right hand pain 03/25/2020  . Fracture of humeral head, right, closed 03/04/2020  . ADHD (attention deficit hyperactivity disorder) 01/29/2020  . Hypertension 01/29/2020  . Atrophic vaginitis 06/21/2017  . Migraine headache 06/13/2016  . Age-related facial wrinkles 12/04/2012  . Other specified hypertrophic and atrophic condition of skin 12/04/2012  . 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 Rosacea 12/04/2012  . Visual field defect 12/04/2012    Trachelle Low C. Jairy Angulo PT, DPT 06/27/20 5:53 PM   [facility] [address] [place], [place], [address] Phone: [phone]   Fax:  [phone]  Name: Deborah York MRN: [id number] Date of Birth: 08/17/1956

## 2020-06-28 ENCOUNTER — Other Ambulatory Visit (HOSPITAL_BASED_OUTPATIENT_CLINIC_OR_DEPARTMENT_OTHER): Payer: Self-pay | Admitting: Family Medicine

## 2020-06-28 MED FILL — NORETHIN-ETH ESTRAD 1 MG-5: 1-5 | 84 days supply | Qty: 84 | Fill #0

## 2020-06-28 MED FILL — AMLODIPINE BESYLATE 5 MG TA: 5 | 90 days supply | Qty: 90 | Fill #0

## 2020-06-29 ENCOUNTER — Ambulatory Visit: Payer: 59 | Admitting: Physical Therapy

## 2020-06-29 ENCOUNTER — Encounter: Payer: Self-pay | Admitting: Physical Therapy

## 2020-06-29 ENCOUNTER — Other Ambulatory Visit: Payer: Self-pay

## 2020-06-29 DIAGNOSIS — M25611 Stiffness of right shoulder, not elsewhere classified: Secondary | ICD-10-CM

## 2020-06-29 DIAGNOSIS — M6281 Muscle weakness (generalized): Secondary | ICD-10-CM | POA: Diagnosis not present

## 2020-06-29 DIAGNOSIS — M25511 Pain in right shoulder: Secondary | ICD-10-CM | POA: Diagnosis not present

## 2020-06-29 NOTE — Therapy (Signed)
[place], [place], [address] Phone: [phone]   Fax:  [phone]  Physical Therapy Treatment  Patient Details  Name: Deborah York MRN: [id number] Date of Birth: January 22, 1956 Referring Provider (PT): Aundria Mems, MD   Encounter Date: 06/29/2020   PT End of Session - 06/29/20 1722    Visit Number 16    Date for PT Re-Evaluation 07/15/20    Authorization Type UMR    PT Start Time 1713    PT Stop Time 1754    PT Time Calculation (min) 41 min    Activity Tolerance Patient tolerated treatment well    Behavior During Therapy [facility] for tasks assessed/performed           Past Medical History:  Diagnosis Date  . Hypertension   . Migraines     Past Surgical History:  Procedure Laterality Date  . TOE SURGERY      There were no vitals filed for this visit.   Subjective Assessment - 06/29/20 1721    Subjective Maybe a 1 or 2 today, feeling pretty good.    Patient Stated Goals ROM, yard work- dirt/mulch bags, decrease pain, exercise- free weights, assist husband post CVA              [facility] PT Assessment - 06/29/20 0001      AROM   Right Shoulder Flexion 130 Degrees    Right Shoulder ABduction 104 Degrees                         [facility] Adult PT Treatment/Exercise - 06/29/20 0001      Shoulder Exercises: Supine   Flexion Limitations ABCs 2lb in handx2    Other Supine Exercises quick/small circles- PT assist in depression      Shoulder Exercises: Sidelying   Flexion Limitations to 90- PT assist in rotation to avoid winging    ABduction Limitations to 90, full avail range      Shoulder Exercises: Standing   External Rotation Limitations bent at 90- Lt hand on table    Row Limitations 2lb bent with Lt hand on table    Other Standing Exercises wall push ups                    PT Short Term Goals - 05/19/20 1806      PT SHORT TERM GOAL #1   Title Pt will  demo AROM without shoulder elevation    Status Achieved      PT SHORT TERM GOAL #2   Title full PROM available    Baseline mild limitations,see flowsheet    Status On-going             PT Long Term Goals - 05/19/20 1735      PT LONG TERM GOAL #1   Title Pt will be independent with rest breaks and postural adjustments during long days at work    Baseline mostly but sometimes not    Status On-going      PT LONG TERM GOAL #2   Title Pt will be able to lift light weights in all ranges to begin return to long term UE exercise program    Baseline limited strength, requires further training    Status On-going      PT LONG TERM GOAL #3   Title Pt will be able to begin yard work again    Baseline tried but experienced significant increase  in pain    Status On-going      PT LONG TERM GOAL #4   Title pt will be able to complete household chores and care activities for husband without being limited by pain    Baseline avoids lifting and heavy activities due to strength limitations    Status On-going                 Plan - 06/29/20 1758    Clinical Impression Statement Significant winging noted in sidelying flexion that required assistance- no pain with assist. Will benefit from continued strengthening and stability. Abduction decreased slightly as expected with increasing stability but flexion is improving well. Decided to have her schedule through 10/1 when she f/u with MD to continue to progress to PLOF.    PT Treatment/Interventions ADLs/Self Care Home Management;Cryotherapy;Electrical Stimulation;Moist Heat;Iontophoresis 4mg /ml Dexamethasone;Therapeutic activities;Therapeutic exercise;Patient/family education;Neuromuscular re-education;Manual techniques;Taping;Dry needling;Passive range of motion    PT Next Visit Plan cont scapular control, incr CKC challenge    PT Home Exercise Plan [id number]    Consulted and Agree with Plan of Care Patient           Patient will  benefit from skilled therapeutic intervention in order to improve the following deficits and impairments:  Pain, Postural dysfunction, Increased muscle spasms, Decreased activity tolerance, Decreased range of motion, Decreased strength, Impaired flexibility  Visit Diagnosis: Stiffness of right shoulder, not elsewhere classified  Acute pain of right shoulder  Muscle weakness (generalized)     Problem List Patient Active Problem List   Diagnosis Date Noted  . Right hand pain 03/25/2020  . Fracture of humeral head, right, closed 03/04/2020  . ADHD (attention deficit hyperactivity disorder) 01/29/2020  . Hypertension 01/29/2020  . Atrophic vaginitis 06/21/2017  . Migraine headache 06/13/2016  . Age-related facial wrinkles 12/04/2012  . Other specified hypertrophic and atrophic condition of skin 12/04/2012  . 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 Rosacea 12/04/2012  . Visual field defect 12/04/2012    Orissa Arreaga C. Shenelle Klas PT, DPT 06/29/20 6:01 PM   [place], [place], [address] Phone: [phone]   Fax:  [phone]  Name: Deborah York MRN: [id number] Date of Birth: February 23, 1956

## 2020-07-05 ENCOUNTER — Encounter: Payer: Self-pay | Admitting: Physical Therapy

## 2020-07-05 ENCOUNTER — Other Ambulatory Visit: Payer: Self-pay

## 2020-07-05 ENCOUNTER — Ambulatory Visit: Payer: 59 | Admitting: Physical Therapy

## 2020-07-05 DIAGNOSIS — M6281 Muscle weakness (generalized): Secondary | ICD-10-CM

## 2020-07-05 DIAGNOSIS — M25611 Stiffness of right shoulder, not elsewhere classified: Secondary | ICD-10-CM

## 2020-07-05 DIAGNOSIS — M25511 Pain in right shoulder: Secondary | ICD-10-CM | POA: Diagnosis not present

## 2020-07-05 NOTE — Therapy (Signed)
[place], [place], [address] Phone: [phone]   Fax:  [phone]  Physical Therapy Treatment  Patient Details  Name: Deborah York MRN: [id number] Date of Birth: [birth year] Referring Provider (PT): Aundria Mems, MD   Encounter Date: 07/05/2020   PT End of Session - 07/05/20 1715    Visit Number 17    Date for PT Re-Evaluation 07/15/20    Authorization Type UMR    PT Start Time 1714    PT Stop Time 1755    PT Time Calculation (min) 41 min    Activity Tolerance Patient tolerated treatment well    Behavior During Therapy [facility] for tasks assessed/performed           Past Medical History:  Diagnosis Date  . Hypertension   . Migraines     Past Surgical History:  Procedure Laterality Date  . TOE SURGERY      There were no vitals filed for this visit.   Subjective Assessment - 07/05/20 1714    Subjective doing more so I have more soreness but I am happy with it.              [facility] PT Assessment - 07/05/20 0001      AROM   Right Shoulder Flexion 142 Degrees                         [facility] Adult PT Treatment/Exercise - 07/05/20 0001      Shoulder Exercises: Supine   Internal Rotation Limitations 1lb with elbow on tabld    Flexion Limitations both arms holding 2lb, full avail ROM    Other Supine Exercises biceps curls 3x10 supinated & palm in    Other Supine Exercises skull crushers 2 lb      Shoulder Exercises: Sidelying   Flexion Limitations full available range 1lb    Other Sidelying Exercises flx to 90-horiz abd- lower to hip      Shoulder Exercises: ROM/Strengthening   UBE (Upper Arm Bike) 3/3 L1    Other ROM/Strengthening Exercises wall push ups arms wide      Shoulder Exercises: Stretch   Internal Rotation Stretch Limitations stretch strap behind back    Table Stretch - Abduction 30 seconds    Table Stretch - ABduction Limitations right pec minor       Manual Therapy   Soft tissue mobilization subscap    Passive ROM all ranges                    PT Short Term Goals - 05/19/20 1806      PT SHORT TERM GOAL #1   Title Pt will demo AROM without shoulder elevation    Status Achieved      PT SHORT TERM GOAL #2   Title full PROM available    Baseline mild limitations,see flowsheet    Status On-going             PT Long Term Goals - 05/19/20 1735      PT LONG TERM GOAL #1   Title Pt will be independent with rest breaks and postural adjustments during long days at work    Baseline mostly but sometimes not    Status On-going      PT LONG TERM GOAL #2   Title Pt will be able to lift light weights in all ranges to begin return to long term UE exercise program  Baseline limited strength, requires further training    Status On-going      PT LONG TERM GOAL #3   Title Pt will be able to begin yard work again    Baseline tried but experienced significant increase in pain    Status On-going      PT LONG TERM GOAL #4   Title pt will be able to complete household chores and care activities for husband without being limited by pain    Baseline avoids lifting and heavy activities due to strength limitations    Status On-going                 Plan - 07/05/20 1818    Clinical Impression Statement Endurance improving to link exercises together rather than taking many breaks. IR in stretch to approx L2 bu actively barely reaching thumb to S1- notable tightness in subscap likely contributing. Was able to control wiging in sidelying exercises without assistance today.    PT Treatment/Interventions ADLs/Self Care Home Management;Cryotherapy;Electrical Stimulation;Moist Heat;Iontophoresis 4mg /ml Dexamethasone;Therapeutic activities;Therapeutic exercise;Patient/family education;Neuromuscular re-education;Manual techniques;Taping;Dry needling;Passive range of motion    PT Next Visit Plan cont scapular control, incr CKC challenge     PT Home Exercise Plan [email or web address]    Consulted and Agree with Plan of Care Patient           Patient will benefit from skilled therapeutic intervention in order to improve the following deficits and impairments:  Pain, Postural dysfunction, Increased muscle spasms, Decreased activity tolerance, Decreased range of motion, Decreased strength, Impaired flexibility  Visit Diagnosis: Stiffness of right shoulder, not elsewhere classified  Acute pain of right shoulder  Muscle weakness (generalized)     Problem List Patient Active Problem List   Diagnosis Date Noted  . Right hand pain 03/25/2020  . Fracture of humeral head, right, closed 03/04/2020  . ADHD (attention deficit hyperactivity disorder) 01/29/2020  . Hypertension 01/29/2020  . Atrophic vaginitis 06/21/2017  . Migraine headache 06/13/2016  . Age-related facial wrinkles 12/04/2012  . Other specified hypertrophic and atrophic condition of skin 12/04/2012  . 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 Rosacea 12/04/2012  . Visual field defect 12/04/2012  Deborah York C. Deborah York PT, DPT 07/05/20 6:22 PM   [facility] [address] [place], [place], [address] Phone: [phone]   Fax:  [phone]  Name: Deborah York MRN: [id number] Date of Birth: March 11, 1956

## 2020-07-07 ENCOUNTER — Ambulatory Visit: Payer: 59 | Admitting: Physical Therapy

## 2020-07-07 ENCOUNTER — Encounter: Payer: Self-pay | Admitting: Physical Therapy

## 2020-07-07 ENCOUNTER — Other Ambulatory Visit: Payer: Self-pay

## 2020-07-07 DIAGNOSIS — M25611 Stiffness of right shoulder, not elsewhere classified: Secondary | ICD-10-CM

## 2020-07-07 DIAGNOSIS — M25511 Pain in right shoulder: Secondary | ICD-10-CM

## 2020-07-07 DIAGNOSIS — M6281 Muscle weakness (generalized): Secondary | ICD-10-CM

## 2020-07-07 NOTE — Therapy (Signed)
[place], [place], [address] Phone: [phone]   Fax:  [phone]  Physical Therapy Treatment  Patient Details  Name: Deborah York MRN: [id number] Date of Birth: Apr 13, 1956 Referring Provider (PT): Aundria Mems, MD   Encounter Date: 07/07/2020   PT End of Session - 07/07/20 1724    Visit Number 18    Date for PT Re-Evaluation 07/15/20    Authorization Type UMR    PT Start Time 4627    PT Stop Time 1754    PT Time Calculation (min) 39 min    Activity Tolerance Patient tolerated treatment well    Behavior During Therapy [facility] for tasks assessed/performed           Past Medical History:  Diagnosis Date  . Hypertension   . Migraines     Past Surgical History:  Procedure Laterality Date  . TOE SURGERY      There were no vitals filed for this visit.   Subjective Assessment - 07/07/20 1718    Subjective A little sore but doing okay.    Patient Stated Goals ROM, yard work- dirt/mulch bags, decrease pain, exercise- free weights, assist husband post CVA    Currently in Pain? Yes    Pain Score 3     Pain Location Shoulder    Pain Orientation Right    Pain Descriptors / Indicators Sore              [facility] PT Assessment - 07/07/20 0001      Assessment   Medical Diagnosis s/p Rt proximal humerus fx    Referring Provider (PT) Aundria Mems, MD    Onset Date/Surgical Date 02/26/20                         [facility] Adult PT Treatment/Exercise - 07/07/20 0001      Shoulder Exercises: Supine   Other Supine Exercises supine ABCs green plyoball      Shoulder Exercises: Prone   Retraction Limitations protraction/retraction prone on elbows    Flexion Limitations full range to tolerance    External Rotation Strengthening;Both;20 reps;Theraband;10 reps    Theraband Level (Shoulder External Rotation) Level 1 (Yellow)    Other Prone Exercises pqed rocking    Other Prone  Exercises knee plank with push ups      Shoulder Exercises: Standing   Row Both;20 reps;Theraband    Theraband Level (Shoulder Row) Level 3 (Green)    Other Standing Exercises extension anchored red tband    Other Standing Exercises wall walk with low trap set      Manual Therapy   Soft tissue mobilization subscap Rt upper trap    Passive ROM flexion, abduction                    PT Short Term Goals - 05/19/20 1806      PT SHORT TERM GOAL #1   Title Pt will demo AROM without shoulder elevation    Status Achieved      PT SHORT TERM GOAL #2   Title full PROM available    Baseline mild limitations,see flowsheet    Status On-going             PT Long Term Goals - 05/19/20 1735      PT LONG TERM GOAL #1   Title Pt will be independent with rest breaks and postural adjustments during long days at work  Baseline mostly but sometimes not    Status On-going      PT LONG TERM GOAL #2   Title Pt will be able to lift light weights in all ranges to begin return to long term UE exercise program    Baseline limited strength, requires further training    Status On-going      PT LONG TERM GOAL #3   Title Pt will be able to begin yard work again    Baseline tried but experienced significant increase in pain    Status On-going      PT LONG TERM GOAL #4   Title pt will be able to complete household chores and care activities for husband without being limited by pain    Baseline avoids lifting and heavy activities due to strength limitations    Status On-going                 Plan - 07/07/20 1754    Clinical Impression Statement End ranges all continue to have stretchy end feel with no pain passively. Pain with Wall walks to trap set but decreased ROM slightly and was able to perform reps. advanced wall push up to mat with small range- good stability.    PT Treatment/Interventions ADLs/Self Care Home Management;Cryotherapy;Electrical Stimulation;Moist  Heat;Iontophoresis 4mg /ml Dexamethasone;Therapeutic activities;Therapeutic exercise;Patient/family education;Neuromuscular re-education;Manual techniques;Taping;Dry needling;Passive range of motion    PT Next Visit Plan CKC challenges, lifting    PT Home Exercise Plan [id number]    Consulted and Agree with Plan of Care Patient           Patient will benefit from skilled therapeutic intervention in order to improve the following deficits and impairments:  Pain, Postural dysfunction, Increased muscle spasms, Decreased activity tolerance, Decreased range of motion, Decreased strength, Impaired flexibility  Visit Diagnosis: Stiffness of right shoulder, not elsewhere classified  Acute pain of right shoulder  Muscle weakness (generalized)     Problem List Patient Active Problem List   Diagnosis Date Noted  . Right hand pain 03/25/2020  . Fracture of humeral head, right, closed 03/04/2020  . ADHD (attention deficit hyperactivity disorder) 01/29/2020  . Hypertension 01/29/2020  . Atrophic vaginitis 06/21/2017  . Migraine headache 06/13/2016  . Age-related facial wrinkles 12/04/2012  . Other specified hypertrophic and atrophic condition of skin 12/04/2012  . 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 Rosacea 12/04/2012  . Visual field defect 12/04/2012   Caedan Sumler C. Jarquavious Fentress PT, DPT 07/07/20 5:57 PM   [place] [facility] [address] [place], [place], [address] Phone: [phone]   Fax:  [phone]  Name: Deborah York MRN: [id number] Date of Birth: Aug 12, 1956

## 2020-07-12 ENCOUNTER — Encounter: Payer: Self-pay | Admitting: Physical Therapy

## 2020-07-12 ENCOUNTER — Other Ambulatory Visit: Payer: Self-pay

## 2020-07-12 ENCOUNTER — Ambulatory Visit: Payer: 59 | Admitting: Physical Therapy

## 2020-07-12 DIAGNOSIS — M25611 Stiffness of right shoulder, not elsewhere classified: Secondary | ICD-10-CM

## 2020-07-12 DIAGNOSIS — M6281 Muscle weakness (generalized): Secondary | ICD-10-CM

## 2020-07-12 DIAGNOSIS — M25511 Pain in right shoulder: Secondary | ICD-10-CM

## 2020-07-12 NOTE — Therapy (Signed)
[place], [place], [address] Phone: [phone]   Fax:  [phone]  Physical Therapy Treatment  Patient Details  Name: Deborah York MRN: [id number] Date of Birth: [birth year] Referring Provider (PT): Aundria Mems, MD   Encounter Date: 07/12/2020   PT End of Session - 07/12/20 1719    Visit Number 19    Date for PT Re-Evaluation 07/15/20    Authorization Type UMR    PT Start Time 0998    PT Stop Time 1753    PT Time Calculation (min) 38 min    Activity Tolerance Patient tolerated treatment well    Behavior During Therapy [facility] for tasks assessed/performed           Past Medical History:  Diagnosis Date  . Hypertension   . Migraines     Past Surgical History:  Procedure Laterality Date  . TOE SURGERY      There were no vitals filed for this visit.   Subjective Assessment - 07/12/20 1718    Subjective It is getting there but still having a hard time for higher shelves. was sore after ast visit. able to do more yard work but avoiding lifting heavy bags.    Patient Stated Goals ROM, yard work- dirt/mulch bags, decrease pain, exercise- free weights, assist husband post CVA    Currently in Pain? No/denies              [facility] PT Assessment - 07/12/20 0001      AROM   Right Shoulder Flexion 136 Degrees                         [facility] Adult PT Treatment/Exercise - 07/12/20 0001      Shoulder Exercises: Supine   Flexion Limitations on wedge 2lb x8 repst, 1lb x10 full arc    Other Supine Exercises short arc punches on wedge 2lb    Other Supine Exercises ABCs 2lb x2 above 90 deg on wedge      Shoulder Exercises: Standing   External Rotation Right;Theraband;10 reps   3 sets   Theraband Level (Shoulder External Rotation) Level 2 (Red)    Internal Rotation Right;10 reps   3 sets   Theraband Level (Shoulder Internal Rotation) Level 2 (Red)    Flexion Limitations wall slide  to liftoff    Other Standing Exercises AROM flexion in mirror      Shoulder Exercises: ROM/Strengthening   UBE (Upper Arm Bike) L2 retro 3 min      Manual Therapy   Soft tissue mobilization Rt subscap, infraspinatus                    PT Short Term Goals - 05/19/20 1806      PT SHORT TERM GOAL #1   Title Pt will demo AROM without shoulder elevation    Status Achieved      PT SHORT TERM GOAL #2   Title full PROM available    Baseline mild limitations,see flowsheet    Status On-going             PT Long Term Goals - 05/19/20 1735      PT LONG TERM GOAL #1   Title Pt will be independent with rest breaks and postural adjustments during long days at work    Baseline mostly but sometimes not    Status On-going      PT LONG TERM GOAL #2  Title Pt will be able to lift light weights in all ranges to begin return to long term UE exercise program    Baseline limited strength, requires further training    Status On-going      PT LONG TERM GOAL #3   Title Pt will be able to begin yard work again    Baseline tried but experienced significant increase in pain    Status On-going      PT LONG TERM GOAL #4   Title pt will be able to complete household chores and care activities for husband without being limited by pain    Baseline avoids lifting and heavy activities due to strength limitations    Status On-going                 Plan - 07/12/20 1756    Clinical Impression Statement AROM tolerance and control continues to improve. Added weights to long and short arc with cues for scap retraction and to avoid elevation. Advised her to try emptying dishwasher as functional exercises. Added band resisted IR/ER to isolate [facility] further.    PT Treatment/Interventions ADLs/Self Care Home Management;Cryotherapy;Electrical Stimulation;Moist Heat;Iontophoresis 4mg /ml Dexamethasone;Therapeutic activities;Therapeutic exercise;Patient/family education;Neuromuscular  re-education;Manual techniques;Taping;Dry needling;Passive range of motion    PT Next Visit Plan weighted long arc in seated    PT Home Exercise Plan [id number]    Consulted and Agree with Plan of Care Patient           Patient will benefit from skilled therapeutic intervention in order to improve the following deficits and impairments:  Pain, Postural dysfunction, Increased muscle spasms, Decreased activity tolerance, Decreased range of motion, Decreased strength, Impaired flexibility  Visit Diagnosis: Stiffness of right shoulder, not elsewhere classified  Acute pain of right shoulder  Muscle weakness (generalized)     Problem List Patient Active Problem List   Diagnosis Date Noted  . Right hand pain 03/25/2020  . Fracture of humeral head, right, closed 03/04/2020  . ADHD (attention deficit hyperactivity disorder) 01/29/2020  . Hypertension 01/29/2020  . Atrophic vaginitis 06/21/2017  . Migraine headache 06/13/2016  . Age-related facial wrinkles 12/04/2012  . Other specified hypertrophic and atrophic condition of skin 12/04/2012  . 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 Rosacea 12/04/2012  . Visual field defect 12/04/2012    Baldomero Mirarchi C. Derrien Anschutz PT, DPT 07/12/20 5:59 PM   [place], [place], [address] Phone: [phone]   Fax:  [phone]  Name: Deborah York MRN: [id number] Date of Birth: 09-15-1956

## 2020-07-14 ENCOUNTER — Encounter: Payer: Self-pay | Admitting: Physical Therapy

## 2020-07-14 ENCOUNTER — Ambulatory Visit: Payer: 59 | Admitting: Physical Therapy

## 2020-07-14 ENCOUNTER — Other Ambulatory Visit: Payer: Self-pay

## 2020-07-14 DIAGNOSIS — M25511 Pain in right shoulder: Secondary | ICD-10-CM | POA: Diagnosis not present

## 2020-07-14 DIAGNOSIS — M25611 Stiffness of right shoulder, not elsewhere classified: Secondary | ICD-10-CM | POA: Diagnosis not present

## 2020-07-14 DIAGNOSIS — M6281 Muscle weakness (generalized): Secondary | ICD-10-CM | POA: Diagnosis not present

## 2020-07-14 NOTE — Therapy (Signed)
[place], [place], [address] Phone: [phone]   Fax:  [phone]  Physical Therapy Treatment/Re-evaluation  Patient Details  Name: Deborah York MRN: [id number] Date of Birth: 1955-12-12 Referring Provider (PT): Aundria Mems, MD   Encounter Date: 07/14/2020   PT End of Session - 07/14/20 1721    Visit Number 20    Date for PT Re-Evaluation 08/27/20    Authorization Type UMR    PT Start Time 1716    PT Stop Time 1754    PT Time Calculation (min) 38 min    Activity Tolerance Patient tolerated treatment well    Behavior During Therapy [facility] for tasks assessed/performed           Past Medical History:  Diagnosis Date  . Hypertension   . Migraines     Past Surgical History:  Procedure Laterality Date  . TOE SURGERY      There were no vitals filed for this visit.   Subjective Assessment - 07/14/20 1719    Subjective I was sore after last visit and have been working late so stretches have been minimal compared to what I usually do.    Patient Stated Goals ROM, yard work- dirt/mulch bags, decrease pain, exercise- free weights, assist husband post CVA    Currently in Pain? Yes    Pain Score 3     Pain Location Shoulder    Pain Orientation Right    Pain Descriptors / Indicators Sore    Aggravating Factors  not stretching    Pain Relieving Factors stretches              [facility] PT Assessment - 07/14/20 0001      Assessment   Medical Diagnosis s/p Rt proximal humerus fx    Referring Provider (PT) Aundria Mems, MD    Onset Date/Surgical Date 02/26/20    Hand Dominance Left      Precautions   Precautions None      Restrictions   Weight Bearing Restrictions No      Balance Screen   Has the patient fallen in the past 6 months Yes    How many times? 1    Has the patient had a decrease in activity level because of a fear of falling?  Yes    Is the patient reluctant to leave  their home because of a fear of falling?  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      Cognition   Overall Cognitive Status Within Functional Limits for tasks assessed      Observation/Other Assessments   Focus on Therapeutic Outcomes (FOTO)  37% limitation      Posture/Postural Control   Posture Comments mild Rt scapular winging      AROM   Right Shoulder Flexion 136 Degrees    Right Shoulder ABduction 120 Degrees    Right Shoulder Internal Rotation --   T12   Right Shoulder External Rotation --   T2     Strength   Strength Assessment Site Shoulder    Right/Left Shoulder Right    Right Shoulder Flexion 4/5    Right Shoulder Extension 4-/5    Right Shoulder ABduction 4/5    Right  Shoulder Internal Rotation 4+/5    Right Shoulder External Rotation 4-/5    Right Hand Grip (lbs) 70      Palpation   Palpation comment tightness in pectoralis, stretchy end feels                         [facility] Adult PT Treatment/Exercise - 07/14/20 0001      Shoulder Exercises: Supine   External Rotation 10 reps;Strengthening;Right    External Rotation Limitations at 45 abd yellow tband at foot    Flexion Right;Strengthening;10 reps    Flexion Limitations yellow band resisted, full arc    ABduction Limitations yellow tband anchored at foot      Shoulder Exercises: Prone   Extension Right;12 reps    Horizontal ABduction 1 10 reps;Right    Other Prone Exercises triceps kick x20 Rt      Manual Therapy   Soft tissue mobilization Rt subscap, pectoralis    Passive ROM GHJ flexion, abduction                  PT Education - 07/14/20 1756    Education Details goals, FOTO, POC    Person(s) Educated Patient    Methods Explanation    Comprehension  Verbalized understanding;Need further instruction            PT Short Term Goals - 05/19/20 1806      PT SHORT TERM GOAL #1   Title Pt will demo AROM without shoulder elevation    Status Achieved      PT SHORT TERM GOAL #2   Title full PROM available    Baseline mild limitations,see flowsheet    Status On-going             PT Long Term Goals - 07/14/20 1730      PT LONG TERM GOAL #1   Title Pt will be independent with rest breaks and postural adjustments during long days at work    Status Achieved      PT LONG TERM GOAL #2   Title Pt will be able to lift light weights in all ranges to begin return to long term UE exercise program    Baseline requires further training for OH    Status On-going      PT LONG TERM GOAL #3   Title Pt will be able to begin yard work again    Baseline still struggling, better but not back to baseline    Status On-going      PT LONG TERM GOAL #4   Title pt will be able to complete household chores and care activities for husband without being limited by pain    Baseline limited by weakness    Status Achieved      PT LONG TERM GOAL #5   Title pt will be able to lift at least 5 lb overhead without compensation for functional activities    Baseline unable at re-eval    Time 6    Period Weeks    Status New    Target Date 08/27/20                 Plan - 07/14/20 1754    Clinical Impression Statement Re-evaluation with extension of POC performed today. Pt is making significant progress toward her goals and has met her FOTO goal but requies further training and strengthening to return to baseline. Weight above 90 deg is still limited as is heavier  weight below 90 deg. Pain levels have been minimal and pt is aware of postural alignment. Will cont to benefit from skilled PT to continue progress toward long term functional goals.    PT Frequency 2x / week    PT Duration 6 weeks    PT Treatment/Interventions ADLs/Self Care Home  Management;Cryotherapy;Electrical Stimulation;Moist Heat;Iontophoresis 69m/ml Dexamethasone;Therapeutic activities;Therapeutic exercise;Patient/family education;Neuromuscular re-education;Manual techniques;Taping;Dry needling;Passive range of motion    PT Next Visit Plan weighted long arc in seated    PT Home Exercise Plan [id number]    Consulted and Agree with Plan of Care Patient           Patient will benefit from skilled therapeutic intervention in order to improve the following deficits and impairments:  Pain, Postural dysfunction, Increased muscle spasms, Decreased activity tolerance, Decreased range of motion, Decreased strength, Impaired flexibility  Visit Diagnosis: Stiffness of right shoulder, not elsewhere classified - Plan: PT plan of care cert/re-cert  Acute pain of right shoulder - Plan: PT plan of care cert/re-cert  Muscle weakness (generalized) - Plan: PT plan of care cert/re-cert     Problem List Patient Active Problem List   Diagnosis Date Noted  . Right hand pain 03/25/2020  . Fracture of humeral head, right, closed 03/04/2020  . ADHD (attention deficit hyperactivity disorder) 01/29/2020  . Hypertension 01/29/2020  . Atrophic vaginitis 06/21/2017  . Migraine headache 06/13/2016  . Age-related facial wrinkles 12/04/2012  . Other specified hypertrophic and atrophic condition of skin 12/04/2012  . 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 Rosacea 12/04/2012  . Visual field defect 12/04/2012    Breindy Meadow C. Nevaen Tredway PT, DPT 07/14/20 5:59 PM   [place] [address]: [phone]  Fax:  [phone] Name: Deborah GANEMRN: [id number] of Birth: 7September 21, 1957

## 2020-07-15 ENCOUNTER — Other Ambulatory Visit (HOSPITAL_BASED_OUTPATIENT_CLINIC_OR_DEPARTMENT_OTHER): Payer: Self-pay | Admitting: Family Medicine

## 2020-07-15 MED FILL — SPIRONOLACTONE 100 MG TAB: 100 | 90 days supply | Qty: 90 | Fill #2

## 2020-07-15 MED FILL — DESVENLAFAXINE SUC ER 50 MG: 50 | 90 days supply | Qty: 90 | Fill #0

## 2020-07-15 MED FILL — METHYLPHENIDATE 10 MG TAB: 10 | 30 days supply | Qty: 90 | Fill #0

## 2020-07-21 ENCOUNTER — Ambulatory Visit: Payer: 59 | Attending: Sports Medicine | Admitting: Physical Therapy

## 2020-07-21 ENCOUNTER — Encounter: Payer: Self-pay | Admitting: Physical Therapy

## 2020-07-21 ENCOUNTER — Other Ambulatory Visit: Payer: Self-pay

## 2020-07-21 DIAGNOSIS — M25511 Pain in right shoulder: Secondary | ICD-10-CM | POA: Diagnosis present

## 2020-07-21 DIAGNOSIS — M6281 Muscle weakness (generalized): Secondary | ICD-10-CM | POA: Insufficient documentation

## 2020-07-21 DIAGNOSIS — M25611 Stiffness of right shoulder, not elsewhere classified: Secondary | ICD-10-CM | POA: Diagnosis present

## 2020-07-21 NOTE — Therapy (Signed)
[place], [place], [address] Phone: [phone]   Fax:  [phone]  Physical Therapy Treatment  Patient Details  Name: Deborah York MRN: [id number] Date of Birth: 30-Oct-1956 Referring Provider (PT): Aundria Mems, MD   Encounter Date: 07/21/2020   PT End of Session - 07/21/20 1711    Visit Number 21    Date for PT Re-Evaluation 08/27/20    Authorization Type UMR    PT Start Time 1652    PT Stop Time 1739    PT Time Calculation (min) 47 min    Activity Tolerance Patient tolerated treatment well    Behavior During Therapy [facility] for tasks assessed/performed           Past Medical History:  Diagnosis Date  . Hypertension   . Migraines     Past Surgical History:  Procedure Laterality Date  . TOE SURGERY      There were no vitals filed for this visit.   Subjective Assessment - 07/21/20 1655    Subjective Min pain because I did a bit much at home.  I feel like i've turned a corner.    Currently in Pain? Yes    Pain Score 3     Pain Location Shoulder    Pain Orientation Right    Pain Type Chronic pain    Pain Onset More than a month ago    Pain Frequency Intermittent    Aggravating Factors  overdoing it    Pain Relieving Factors stretches, light activity , rest when needed    Multiple Pain Sites No              [facility] Adult PT Treatment/Exercise - 07/21/20 0001      Pilates   Pilates Reformer see note       Shoulder Exercises: Pulleys   Flexion 3 minutes    Scaption 2 minutes    Other Pulley Exercises horiz abd x 10, IR x 10       Shoulder Exercises: ROM/Strengthening   Wall Wash yellow band flexion 2 sets x 10 anchor with foot on the floor     Modified Plank Limitations shoulder taps in quadruped x 10, then kneeling plank x 10     Other ROM/Strengthening Exercises quadruped 3 lbs horizontal abd x 8 and triceps x 8 with each UE       Manual Therapy   Passive ROM all planes  with light distraction focus on flexion and external rotation            Pilates Reformer used for LE/core strength, postural strength, lumbopelvic disassociation and core control.  Exercises included:   Standing long box overhead press 1 blue double arm press and then single arm in hinge position. Sitting long box facing back:  Row (1 red)  Bicep curl at 90 deg (1 Red) Facing side  ER 1( yellow for Rt , blue for Lt) x 10   Elevation (Yellow for Rt and Lt) x 10 Patient unable to maintain scapular position with manual and verbal cues, side bends trunk to compensate as well Facing forward:  Serving 1 yellow x 10   Hug  Tree (chest fly ) x 8       PT Short Term Goals - 05/19/20 1806      PT SHORT TERM GOAL #1   Title Pt will demo AROM without shoulder elevation    Status Achieved      PT SHORT TERM  GOAL #2   Title full PROM available    Baseline mild limitations,see flowsheet    Status On-going             PT Long Term Goals - 07/14/20 1730      PT LONG TERM GOAL #1   Title Pt will be independent with rest breaks and postural adjustments during long days at work    Status Achieved      PT LONG TERM GOAL #2   Title Pt will be able to lift light weights in all ranges to begin return to long term UE exercise program    Baseline requires further training for OH    Status On-going      PT LONG TERM GOAL #3   Title Pt will be able to begin yard work again    Baseline still struggling, better but not back to baseline    Status On-going      PT [place] #4   Title pt will be able to complete household chores and care activities for husband without being limited by pain    Baseline limited by weakness    Status Achieved      PT LONG TERM GOAL #5   Title pt will be able to lift at least 5 lb overhead without compensation for functional activities    Baseline unable at re-eval    Time 6    Period Weeks    Status New    Target Date 08/27/20                  Plan - 07/21/20 1725    Clinical Impression Statement Able to work on strengthening in all planes today. Needs frequent cues for head, neck and scapular position with overhead reaching.  Stabilizing on Rt UE in quadruped proved challenging but noted she was unable to do this a few weeks ago.  Min pain/fatigued,  declined heat today.    PT Treatment/Interventions ADLs/Self Care Home Management;Cryotherapy;Electrical Stimulation;Moist Heat;Iontophoresis 4mg /ml Dexamethasone;Therapeutic activities;Therapeutic exercise;Patient/family education;Neuromuscular re-education;Manual techniques;Taping;Dry needling;Passive range of motion    PT Next Visit Plan weighted long arc in seated, yellow T band wall slide with lift off , more WB on quadruped or on wall    PT Home Exercise Plan [id number]    Consulted and Agree with Plan of Care Patient           Patient will benefit from skilled therapeutic intervention in order to improve the following deficits and impairments:  Pain, Postural dysfunction, Increased muscle spasms, Decreased activity tolerance, Decreased range of motion, Decreased strength, Impaired flexibility  Visit Diagnosis: Stiffness of right shoulder, not elsewhere classified  Muscle weakness (generalized)     Problem List Patient Active Problem List   Diagnosis Date Noted  . Right hand pain 03/25/2020  . Fracture of humeral head, right, closed 03/04/2020  . ADHD (attention deficit hyperactivity disorder) 01/29/2020  . Hypertension 01/29/2020  . Atrophic vaginitis 06/21/2017  . Migraine headache 06/13/2016  . Age-related facial wrinkles 12/04/2012  . Other specified hypertrophic and atrophic condition of skin 12/04/2012  . 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 Rosacea 12/04/2012  . Visual field defect 12/04/2012    Deborah York 07/21/2020, 5:45 PM  [facility] [address] [place], [place], [address] Phone:  [phone]   Fax:  [phone]  Name: Deborah York MRN: [id number] Date of Birth: [birth year]  Raeford Razor, PT 07/21/20 5:48 PM Phone: [phone] Fax: [phone]

## 2020-07-29 ENCOUNTER — Encounter: Payer: Self-pay | Admitting: Physical Therapy

## 2020-07-29 ENCOUNTER — Ambulatory Visit: Payer: 59 | Admitting: Physical Therapy

## 2020-07-29 ENCOUNTER — Other Ambulatory Visit: Payer: Self-pay

## 2020-07-29 DIAGNOSIS — M25611 Stiffness of right shoulder, not elsewhere classified: Secondary | ICD-10-CM

## 2020-07-29 DIAGNOSIS — M25511 Pain in right shoulder: Secondary | ICD-10-CM | POA: Diagnosis not present

## 2020-07-29 DIAGNOSIS — M6281 Muscle weakness (generalized): Secondary | ICD-10-CM | POA: Diagnosis not present

## 2020-07-29 NOTE — Therapy (Signed)
[place], [place], [address] Phone: [phone]   Fax:  [phone]  Physical Therapy Treatment  Patient Details  Name: Deborah York MRN: [id number] Date of Birth: July 11, 1956 Referring Provider (PT): Aundria Mems, MD   Encounter Date: 07/29/2020   PT End of Session - 07/29/20 1327    Visit Number 22    Date for PT Re-Evaluation 08/27/20    Authorization Type UMR    PT Start Time 1312    PT Stop Time 1407    PT Time Calculation (min) 55 min    Activity Tolerance Patient tolerated treatment well    Behavior During Therapy [facility] for tasks assessed/performed           Past Medical History:  Diagnosis Date  . Hypertension   . Migraines     Past Surgical History:  Procedure Laterality Date  . TOE SURGERY      There were no vitals filed for this visit.   Subjective Assessment - 07/29/20 1314    Subjective I was working this morning and tried moving a small fridge but felt like I had lack of strength- drug it on rug. Still have a hard time reaching really high.    Patient Stated Goals ROM, yard work- dirt/mulch bags, decrease pain, exercise- free weights, assist husband post CVA    Currently in Pain? Yes    Pain Score 3     Pain Location Shoulder    Pain Orientation Right    Pain Descriptors / Indicators Sore    Aggravating Factors  reaching really high    Pain Relieving Factors rest              [facility] PT Assessment - 07/29/20 0001      PROM   Right Shoulder Flexion 148 Degrees    Right Shoulder ABduction 140 Degrees    Right Shoulder Internal Rotation 80 Degrees    Right Shoulder External Rotation 80 Degrees                         [facility] Adult PT Treatment/Exercise - 07/29/20 0001      Shoulder Exercises: Seated   Flexion Both   3x8   Flexion Weight (lbs) 1    Flexion Limitations punch to 90 to full range flexion    Abduction Both   3x6   ABduction Weight  (lbs) 1    ABduction Limitations lateral punch to 90 and to full range OH       Shoulder Exercises: Prone   Flexion Weight (lbs) 2    Flexion Limitations scaption  alternating, qped    Horizontal ABduction 1 Weight (lbs) 2    Horizontal ABduction 1 Limitations qped, alternating to 90    Horizontal ABduction 2 Weight (lbs) 2    Horizontal ABduction 2 Limitations qped, past 90 with thoracic rotaiton    Other Prone Exercises qped row 5lb x15 each    Other Prone Exercises qped triceps kicks 2lb x20 each      Shoulder Exercises: Standing   Row Limitations bent over row & row with stand 15lb kettle bell    Other Standing Exercises 10lb kettle bell carry x2 laps around gym    Other Standing Exercises Y liftoff      Manual Therapy   Soft tissue mobilization Rt upper trap, subscap, subocciptials    Passive ROM GHJ flexion  PT Short Term Goals - 05/19/20 1806      PT SHORT TERM GOAL #1   Title Pt will demo AROM without shoulder elevation    Status Achieved      PT SHORT TERM GOAL #2   Title full PROM available    Baseline mild limitations,see flowsheet    Status On-going             PT Long Term Goals - 07/14/20 1730      PT LONG TERM GOAL #1   Title Pt will be independent with rest breaks and postural adjustments during long days at work    Status Achieved      PT [place] #2   Title Pt will be able to lift light weights in all ranges to begin return to long term UE exercise program    Baseline requires further training for OH    Status On-going      PT LONG TERM GOAL #3   Title Pt will be able to begin yard work again    Baseline still struggling, better but not back to baseline    Status On-going      PT [place] #4   Title pt will be able to complete household chores and care activities for husband without being limited by pain    Baseline limited by weakness    Status Achieved      PT LONG TERM GOAL #5   Title pt will be  able to lift at least 5 lb overhead without compensation for functional activities    Baseline unable at re-eval    Time 6    Period Weeks    Status New    Target Date 08/27/20                 Plan - 07/29/20 1413    Clinical Impression Statement Significant difficulty with upright, full range abduction today which increased upper trap tension & snapping that was decreased with manual therapy. Is improving in weight tolerance but will benefit from traning in bulky items. Encouraged her to use laundry basket at home for large item training.    PT Treatment/Interventions ADLs/Self Care Home Management;Cryotherapy;Electrical Stimulation;Moist Heat;Iontophoresis 4mg /ml Dexamethasone;Therapeutic activities;Therapeutic exercise;Patient/family education;Neuromuscular re-education;Manual techniques;Taping;Dry needling;Passive range of motion    PT Next Visit Plan continue full arc against gravity, large items    PT Home Exercise Plan [id number]    Consulted and Agree with Plan of Care Patient           Patient will benefit from skilled therapeutic intervention in order to improve the following deficits and impairments:  Pain, Postural dysfunction, Increased muscle spasms, Decreased activity tolerance, Decreased range of motion, Decreased strength, Impaired flexibility  Visit Diagnosis: Stiffness of right shoulder, not elsewhere classified  Muscle weakness (generalized)  Acute pain of right shoulder     Problem List Patient Active Problem List   Diagnosis Date Noted  . Right hand pain 03/25/2020  . Fracture of humeral head, right, closed 03/04/2020  . ADHD (attention deficit hyperactivity disorder) 01/29/2020  . Hypertension 01/29/2020  . Atrophic vaginitis 06/21/2017  . Migraine headache 06/13/2016  . Age-related facial wrinkles 12/04/2012  . Other specified hypertrophic and atrophic condition of skin 12/04/2012  . 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 Rosacea 12/04/2012  . Visual  field defect 12/04/2012   Vyncent Overby C. Burnis Kaser PT, DPT 07/29/20 2:16 PM   [facility] [address] [place], [place], [address]  Phone: [phone]   Fax:  [phone]  Name: Deborah York MRN: [id number] Date of Birth: 1955/12/12

## 2020-08-02 ENCOUNTER — Other Ambulatory Visit: Payer: Self-pay

## 2020-08-02 ENCOUNTER — Encounter: Payer: Self-pay | Admitting: Physical Therapy

## 2020-08-02 ENCOUNTER — Ambulatory Visit: Payer: 59 | Admitting: Physical Therapy

## 2020-08-02 DIAGNOSIS — M25611 Stiffness of right shoulder, not elsewhere classified: Secondary | ICD-10-CM

## 2020-08-02 DIAGNOSIS — M25511 Pain in right shoulder: Secondary | ICD-10-CM

## 2020-08-02 DIAGNOSIS — M6281 Muscle weakness (generalized): Secondary | ICD-10-CM

## 2020-08-02 NOTE — Therapy (Signed)
[place], [place], [address] Phone: [phone]   Fax:  [phone]  Physical Therapy Treatment  Patient Details  Name: Deborah York MRN: [id number] Date of Birth: July 19, 1956 Referring Provider (PT): Aundria Mems, MD   Encounter Date: 08/02/2020   PT End of Session - 08/02/20 1756    Visit Number 23    Date for PT Re-Evaluation 08/27/20    Authorization Type UMR    PT Start Time 1713    PT Stop Time 1755    PT Time Calculation (min) 42 min    Activity Tolerance Patient tolerated treatment well    Behavior During Therapy [facility] for tasks assessed/performed           Past Medical History:  Diagnosis Date  . Hypertension   . Migraines     Past Surgical History:  Procedure Laterality Date  . TOE SURGERY      There were no vitals filed for this visit.   Subjective Assessment - 08/02/20 1716    Subjective I was quite a bit sore. the abduction one makes it hurt and snaps    Patient Stated Goals ROM, yard work- dirt/mulch bags, decrease pain, exercise- free weights, assist husband post CVA    Currently in Pain? Yes    Pain Score 3     Pain Location Shoulder    Pain Orientation Right    Pain Descriptors / Indicators Sore                             [facility] Adult PT Treatment/Exercise - 08/02/20 0001      Shoulder Exercises: Supine   Other Supine Exercises isometric ER by PT    Other Supine Exercises isometric abd/add by PT at 90      Shoulder Exercises: Seated   Other Seated Exercises triceps dips edge of low mat x4 rounds to fatigue      Shoulder Exercises: Sidelying   External Rotation 15 reps    ABduction Limitations ER to abd punch at 90    Other Sidelying Exercises sidelying abduction full range with cues for scap retraction      Shoulder Exercises: Standing   Other Standing Exercises biceps curls 4 lb each hand    Other Standing Exercises wall slide with  liftoff 2lb      Manual Therapy   Manual therapy comments cervical traction    Soft tissue mobilization Rt upper trap                    PT Short Term Goals - 05/19/20 1806      PT SHORT TERM GOAL #1   Title Pt will demo AROM without shoulder elevation    Status Achieved      PT SHORT TERM GOAL #2   Title full PROM available    Baseline mild limitations,see flowsheet    Status On-going             PT Long Term Goals - 07/14/20 1730      PT LONG TERM GOAL #1   Title Pt will be independent with rest breaks and postural adjustments during long days at work    Status Achieved      PT LONG TERM GOAL #2   Title Pt will be able to lift light weights in all ranges to begin return to long term UE exercise program    Baseline requires  further training for OH    Status On-going      PT LONG TERM GOAL #3   Title Pt will be able to begin yard work again    Baseline still struggling, better but not back to baseline    Status On-going      PT [place] #4   Title pt will be able to complete household chores and care activities for husband without being limited by pain    Baseline limited by weakness    Status Achieved      PT LONG TERM GOAL #5   Title pt will be able to lift at least 5 lb overhead without compensation for functional activities    Baseline unable at re-eval    Time 6    Period Weeks    Status New    Target Date 08/27/20                 Plan - 08/02/20 1757    Clinical Impression Statement Upper trap was very tight with slightly elevated first rib after progressing abduction challenges to upright. Cuing required for scapular control which reduced snapping and improved motion. will decrease to sidelying for now and progress back to upright as tolerated.    PT Treatment/Interventions ADLs/Self Care Home Management;Cryotherapy;Electrical Stimulation;Moist Heat;Iontophoresis 4mg /ml Dexamethasone;Therapeutic activities;Therapeutic  exercise;Patient/family education;Neuromuscular re-education;Manual techniques;Taping;Dry needling;Passive range of motion    PT Home Exercise Plan [id number]    Consulted and Agree with Plan of Care Patient           Patient will benefit from skilled therapeutic intervention in order to improve the following deficits and impairments:  Pain, Postural dysfunction, Increased muscle spasms, Decreased activity tolerance, Decreased range of motion, Decreased strength, Impaired flexibility  Visit Diagnosis: Stiffness of right shoulder, not elsewhere classified  Muscle weakness (generalized)  Acute pain of right shoulder     Problem List Patient Active Problem List   Diagnosis Date Noted  . Right hand pain 03/25/2020  . Fracture of humeral head, right, closed 03/04/2020  . ADHD (attention deficit hyperactivity disorder) 01/29/2020  . Hypertension 01/29/2020  . Atrophic vaginitis 06/21/2017  . Migraine headache 06/13/2016  . Age-related facial wrinkles 12/04/2012  . Other specified hypertrophic and atrophic condition of skin 12/04/2012  . 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 Rosacea 12/04/2012  . Visual field defect 12/04/2012    [name] Supak PT, DPT 08/02/20 6:00 PM    [place], [place], [address] Phone: [phone]   Fax:  [phone]  Name: Deborah York MRN: [id number] Date of Birth: Mar 06, 1956

## 2020-08-04 MED FILL — CALCIUM CARBONATE-VITAMIN D: 600-400 | 30 days supply | Qty: 60 | Fill #1

## 2020-08-05 ENCOUNTER — Encounter: Payer: Self-pay | Admitting: Physical Therapy

## 2020-08-05 ENCOUNTER — Ambulatory Visit: Payer: 59 | Admitting: Physical Therapy

## 2020-08-05 ENCOUNTER — Ambulatory Visit: Payer: 59 | Admitting: Sports Medicine

## 2020-08-05 ENCOUNTER — Other Ambulatory Visit: Payer: Self-pay

## 2020-08-05 DIAGNOSIS — M25611 Stiffness of right shoulder, not elsewhere classified: Secondary | ICD-10-CM

## 2020-08-05 DIAGNOSIS — M6281 Muscle weakness (generalized): Secondary | ICD-10-CM

## 2020-08-05 DIAGNOSIS — M25511 Pain in right shoulder: Secondary | ICD-10-CM | POA: Diagnosis not present

## 2020-08-05 NOTE — Therapy (Signed)
[place], [place], [address] Phone: [phone]   Fax:  [phone]  Physical Therapy Treatment  Patient Details  Name: Deborah York MRN: [id number] Date of Birth: December 28, 1955 Referring Provider (PT): Aundria Mems, MD   Encounter Date: 08/05/2020   PT End of Session - 08/05/20 1415    Visit Number 24    Date for PT Re-Evaluation 08/27/20    Authorization Type UMR    PT Start Time 1401    PT Stop Time 1440    PT Time Calculation (min) 39 min    Activity Tolerance Patient tolerated treatment well    Behavior During Therapy [facility] for tasks assessed/performed           Past Medical History:  Diagnosis Date  . Hypertension   . Migraines     Past Surgical History:  Procedure Laterality Date  . TOE SURGERY      There were no vitals filed for this visit.   Subjective Assessment - 08/05/20 1415    Subjective I was doing yard work this morning picking things up so it is stiff today.    Patient Stated Goals ROM, yard work- dirt/mulch bags, decrease pain, exercise- free weights, assist husband post CVA              [facility] PT Assessment - 08/05/20 0001      PROM   Right Shoulder Flexion 156 Degrees   supine with wand                        [facility] Adult PT Treatment/Exercise - 08/05/20 0001      Shoulder Exercises: Supine   Flexion Limitations AROM with wand      Shoulder Exercises: Prone   Horizontal ABduction 1 Limitations thumb up    Other Prone Exercises prone row x15, +triceps extension x15    Other [place] Exercises prone breast stroke motion      Shoulder Exercises: Sidelying   ABduction Limitations ABCs in abd to 90    Other Sidelying Exercises abd to 90-horiz abd- abd to 90-lower      Shoulder Exercises: Standing   Other Standing Exercises ball on wall circles green plyoball    Other Standing Exercises wall push ups wide & narrow      Shoulder Exercises:  Stretch   Other Shoulder Stretches door pec stretch      Manual Therapy   Soft tissue mobilization Rt upper trap, suboccipitals    Passive ROM GHJ flexion & abd stretch                    PT Short Term Goals - 05/19/20 1806      PT SHORT TERM GOAL #1   Title Pt will demo AROM without shoulder elevation    Status Achieved      PT SHORT TERM GOAL #2   Title full PROM available    Baseline mild limitations,see flowsheet    Status On-going             PT Long Term Goals - 07/14/20 1730      PT LONG TERM GOAL #1   Title Pt will be independent with rest breaks and postural adjustments during long days at work    Status Achieved      PT LONG TERM GOAL #2   Title Pt will be able to lift light weights in all ranges to begin return to  long term UE exercise program    Baseline requires further training for OH    Status On-going      PT LONG TERM GOAL #3   Title Pt will be able to begin yard work again    Baseline still struggling, better but not back to baseline    Status On-going      PT [place] #4   Title pt will be able to complete household chores and care activities for husband without being limited by pain    Baseline limited by weakness    Status Achieved      PT LONG TERM GOAL #5   Title pt will be able to lift at least 5 lb overhead without compensation for functional activities    Baseline unable at re-eval    Time 6    Period Weeks    Status New    Target Date 08/27/20                 Plan - 08/05/20 1443    Clinical Impression Statement Arrived with significant tightness in bil upper traps after new challenge of yard work which is to be expected. Capsular ROM not limited. Will begin to progress to lifting at next visit.    PT Treatment/Interventions ADLs/Self Care Home Management;Cryotherapy;Electrical Stimulation;Moist Heat;Iontophoresis 4mg /ml Dexamethasone;Therapeutic activities;Therapeutic exercise;Patient/family  education;Neuromuscular re-education;Manual techniques;Taping;Dry needling;Passive range of motion    PT Next Visit Plan functional lifting-variable size items    PT Home Exercise Plan [id number]    Consulted and Agree with Plan of Care Patient           Patient will benefit from skilled therapeutic intervention in order to improve the following deficits and impairments:  Pain, Postural dysfunction, Increased muscle spasms, Decreased activity tolerance, Decreased range of motion, Decreased strength, Impaired flexibility  Visit Diagnosis: Stiffness of right shoulder, not elsewhere classified  Muscle weakness (generalized)  Acute pain of right shoulder     Problem List Patient Active Problem List   Diagnosis Date Noted  . Right hand pain 03/25/2020  . Fracture of humeral head, right, closed 03/04/2020  . ADHD (attention deficit hyperactivity disorder) 01/29/2020  . Hypertension 01/29/2020  . Atrophic vaginitis 06/21/2017  . Migraine headache 06/13/2016  . Age-related facial wrinkles 12/04/2012  . Other specified hypertrophic and atrophic condition of skin 12/04/2012  . 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 Rosacea 12/04/2012  . Visual field defect 12/04/2012    Kristin Lamagna C. Zamiyah Resendes PT, DPT 08/05/20 2:46 PM   [facility] [address] [place], [place], [address] Phone: [phone]   Fax:  [phone]  Name: Deborah York MRN: [id number] Date of Birth: [birth year]

## 2020-08-09 ENCOUNTER — Other Ambulatory Visit: Payer: Self-pay

## 2020-08-09 ENCOUNTER — Ambulatory Visit: Payer: 59 | Admitting: Physical Therapy

## 2020-08-09 ENCOUNTER — Encounter: Payer: Self-pay | Admitting: Physical Therapy

## 2020-08-09 DIAGNOSIS — M6281 Muscle weakness (generalized): Secondary | ICD-10-CM

## 2020-08-09 DIAGNOSIS — M25511 Pain in right shoulder: Secondary | ICD-10-CM

## 2020-08-09 DIAGNOSIS — M25611 Stiffness of right shoulder, not elsewhere classified: Secondary | ICD-10-CM

## 2020-08-09 NOTE — Therapy (Addendum)
[place], [place], [address] Phone: [phone]   Fax:  [phone]  Physical Therapy Treatment  Patient Details  Name: Deborah York MRN: [id number] Date of Birth: 1956/04/28 Referring Provider (PT): Aundria Mems, MD   Encounter Date: 08/09/2020   PT End of Session - 08/09/20 1744    Visit Number 25    Date for PT Re-Evaluation 08/27/20    Authorization Type UMR    PT Start Time 1716    PT Stop Time 1757    PT Time Calculation (min) 41 min    Activity Tolerance Patient tolerated treatment well    Behavior During Therapy [facility] for tasks assessed/performed           Past Medical History:  Diagnosis Date  . Hypertension   . Migraines     Past Surgical History:  Procedure Laterality Date  . TOE SURGERY      There were no vitals filed for this visit.   Subjective Assessment - 08/09/20 1721    Subjective Still snapping with seated ABD- only occasionally when in sidelying. Still some limitations in reaching by flexibility.    Patient Stated Goals ROM, yard work- dirt/mulch bags, decrease pain, exercise- free weights, assist husband post CVA    Currently in Pain? Yes    Pain Score 2     Pain Location Shoulder    Pain Orientation Right;Lateral    Pain Descriptors / Indicators Aching              [facility] PT Assessment - 08/09/20 0001      AROM   Right Shoulder Flexion 140 Degrees    Right Shoulder ABduction 142 Degrees   discomfort     Strength   Right Shoulder Flexion 4+/5    Right Shoulder ABduction 4/5    Right Shoulder Internal Rotation 5/5    Right Shoulder External Rotation 5/5    Right Hand Grip (lbs) 80                         [facility] Adult PT Treatment/Exercise - 08/09/20 0001      Shoulder Exercises: Seated   ABduction Limitations AAROM to 90 to reduce shoulder elevation    Other Seated Exercises Lt GHJ ER with Rt iso hold yellow tband      Shoulder  Exercises: ROM/Strengthening   UBE (Upper Arm Bike) 2/2 L2      Manual Therapy   Soft tissue mobilization IASTM Rt deltoid, subscap                  PT Education - 08/09/20 1806    Education Details updated goals, FOTO & dscussed POC    Person(s) Educated Patient    Methods Explanation    Comprehension Verbalized understanding;Need further instruction            PT Short Term Goals - 05/19/20 1806      PT SHORT TERM GOAL #1   Title Pt will demo AROM without shoulder elevation    Status Achieved      PT SHORT TERM GOAL #2   Title full PROM available    Baseline mild limitations,see flowsheet    Status On-going             PT Long Term Goals - 07/14/20 1730      PT LONG TERM GOAL #1   Title Pt will be independent with rest breaks and postural  adjustments during long days at work    Status Achieved      PT [place] #2   Title Pt will be able to lift light weights in all ranges to begin return to long term UE exercise program    Baseline requires further training for OH    Status On-going      PT LONG TERM GOAL #3   Title Pt will be able to begin yard work again    Baseline still struggling, better but not back to baseline    Status On-going      PT [place] #4   Title pt will be able to complete household chores and care activities for husband without being limited by pain    Baseline limited by weakness    Status Achieved      PT LONG TERM GOAL #5   Title pt will be able to lift at least 5 lb overhead without compensation for functional activities    Baseline unable at re-eval    Time 6    Period Weeks    Status New    Target Date 08/27/20                 Plan - 08/09/20 1802    Clinical Impression Statement Overall strength and available ROM continue to improve. She is fatigued when she gets to PT and tolerating less exercise as would be expected with increased functional use through the day. Tightness in mid deltoid and  subscap were encouraging GHJ elevation and impingement in abduction and was reduced with manual therapy and cuing. will work at 66 and below in abd to reduce pain and create proper movement mechanics.    PT Treatment/Interventions ADLs/Self Care Home Management;Cryotherapy;Electrical Stimulation;Moist Heat;Iontophoresis 4mg /ml Dexamethasone;Therapeutic activities;Therapeutic exercise;Patient/family education;Neuromuscular re-education;Manual techniques;Taping;Dry needling;Passive range of motion    PT Next Visit Plan review abduction motion, functional lifting    PT Home Exercise Plan [id number]    Consulted and Agree with Plan of Care Patient           Patient will benefit from skilled therapeutic intervention in order to improve the following deficits and impairments:  Pain, Postural dysfunction, Increased muscle spasms, Decreased activity tolerance, Decreased range of motion, Decreased strength, Impaired flexibility  Visit Diagnosis: Stiffness of right shoulder, not elsewhere classified  Muscle weakness (generalized)  Acute pain of right shoulder     Problem List Patient Active Problem List   Diagnosis Date Noted  . Right hand pain 03/25/2020  . Fracture of humeral head, right, closed 03/04/2020  . ADHD (attention deficit hyperactivity disorder) 01/29/2020  . Hypertension 01/29/2020  . Atrophic vaginitis 06/21/2017  . Migraine headache 06/13/2016  . Age-related facial wrinkles 12/04/2012  . Other specified hypertrophic and atrophic condition of skin 12/04/2012  . 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 Rosacea 12/04/2012  . Visual field defect 12/04/2012   Kienna Moncada C. Reda Citron PT, DPT 08/09/20 6:06 PM   [facility] [address] [place], [place], [address] Phone: [phone]   Fax:  [phone]  Name: Deborah York MRN: [id number] Date of Birth: 1956/08/29

## 2020-08-11 ENCOUNTER — Ambulatory Visit: Payer: 59 | Admitting: Physical Therapy

## 2020-08-11 ENCOUNTER — Encounter: Payer: Self-pay | Admitting: Physical Therapy

## 2020-08-11 ENCOUNTER — Other Ambulatory Visit: Payer: Self-pay

## 2020-08-11 DIAGNOSIS — M25611 Stiffness of right shoulder, not elsewhere classified: Secondary | ICD-10-CM

## 2020-08-11 DIAGNOSIS — M25511 Pain in right shoulder: Secondary | ICD-10-CM

## 2020-08-11 DIAGNOSIS — M6281 Muscle weakness (generalized): Secondary | ICD-10-CM | POA: Diagnosis not present

## 2020-08-11 NOTE — Therapy (Signed)
[place], [place], [address] Phone: [phone]   Fax:  [phone]  Physical Therapy Treatment  Patient Details  Name: Deborah York MRN: [id number] Date of Birth: August 23, 1956 Referring Provider (PT): Aundria Mems, MD   Encounter Date: 08/11/2020   PT End of Session - 08/11/20 1720    Visit Number 26    Date for PT Re-Evaluation 08/27/20    Authorization Type UMR    PT Start Time 1275    PT Stop Time 1756    PT Time Calculation (min) 41 min    Activity Tolerance Patient tolerated treatment well    Behavior During Therapy [facility] for tasks assessed/performed           Past Medical History:  Diagnosis Date  . Hypertension   . Migraines     Past Surgical History:  Procedure Laterality Date  . TOE SURGERY      There were no vitals filed for this visit.   Subjective Assessment - 08/11/20 1719    Subjective Not as bad as it was.    Currently in Pain? Yes    Pain Score 2     Pain Location Shoulder    Pain Orientation Right                             [facility] Adult PT Treatment/Exercise - 08/11/20 0001      Shoulder Exercises: Seated   Other Seated Exercises thoracic extension over chair      Shoulder Exercises: Standing   External Rotation Limitations yellow tband at 90/90    ABduction Limitations full range- minimal use of wall for support to decrease pain    Diagonals Limitations D2 extension 7lb on FM    Other Standing Exercises lifting to chest FM 10lb     Other Standing Exercises fwd punch 3lb on FM, lifting of large box without weight to head height      Shoulder Exercises: Pulleys   Flexion 3 minutes      Manual Therapy   Manual therapy comments cervical traciton    Soft tissue mobilization Rt upper trap & levator scap                    PT Short Term Goals - 05/19/20 1806      PT SHORT TERM GOAL #1   Title Pt will demo AROM without shoulder  elevation    Status Achieved      PT SHORT TERM GOAL #2   Title full PROM available    Baseline mild limitations,see flowsheet    Status On-going             PT Long Term Goals - 07/14/20 1730      PT LONG TERM GOAL #1   Title Pt will be independent with rest breaks and postural adjustments during long days at work    Status Achieved      PT LONG TERM GOAL #2   Title Pt will be able to lift light weights in all ranges to begin return to long term UE exercise program    Baseline requires further training for OH    Status On-going      PT LONG TERM GOAL #3   Title Pt will be able to begin yard work again    Baseline still struggling, better but not back to baseline    Status On-going  PT LONG TERM GOAL #4   Title pt will be able to complete household chores and care activities for husband without being limited by pain    Baseline limited by weakness    Status Achieved      PT LONG TERM GOAL #5   Title pt will be able to lift at least 5 lb overhead without compensation for functional activities    Baseline unable at re-eval    Time 6    Period Weeks    Status New    Target Date 08/27/20                 Plan - 08/11/20 1756    Clinical Impression Statement Added lifting to higher levels with unweighted boxes in FCE room. Good tolerance with minimal compensation- will work on this at home with Stage manager. Tactile cuing required to decrease [place] of upper trap but verbalized feeling the proper form.    PT Treatment/Interventions ADLs/Self Care Home Management;Cryotherapy;Electrical Stimulation;Moist Heat;Iontophoresis 4mg /ml Dexamethasone;Therapeutic activities;Therapeutic exercise;Patient/family education;Neuromuscular re-education;Manual techniques;Taping;Dry needling;Passive range of motion    PT Next Visit Plan cont to progress OH, eccentrics    PT Home Exercise Plan [id number]    Consulted and Agree with Plan of Care Patient           Patient will  benefit from skilled therapeutic intervention in order to improve the following deficits and impairments:  Pain, Postural dysfunction, Increased muscle spasms, Decreased activity tolerance, Decreased range of motion, Decreased strength, Impaired flexibility  Visit Diagnosis: Stiffness of right shoulder, not elsewhere classified  Muscle weakness (generalized)  Acute pain of right shoulder     Problem List Patient Active Problem List   Diagnosis Date Noted  . Right hand pain 03/25/2020  . Fracture of humeral head, right, closed 03/04/2020  . ADHD (attention deficit hyperactivity disorder) 01/29/2020  . Hypertension 01/29/2020  . Atrophic vaginitis 06/21/2017  . Migraine headache 06/13/2016  . Age-related facial wrinkles 12/04/2012  . Other specified hypertrophic and atrophic condition of skin 12/04/2012  . 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 Rosacea 12/04/2012  . Visual field defect 12/04/2012    Kamesha Herne C. Hines Kloss PT, DPT 08/11/20 5:58 PM   [facility] [address] [place], [place], [address] Phone: [phone]   Fax:  [phone]  Name: Deborah York MRN: [id number] Date of Birth: [birth year]

## 2020-08-12 DIAGNOSIS — Z1231 Encounter for screening mammogram for malignant neoplasm of breast: Secondary | ICD-10-CM | POA: Diagnosis not present

## 2020-08-15 MED FILL — METHYLPHENIDATE 10 MG TAB: 10 | 30 days supply | Qty: 90 | Fill #0

## 2020-08-15 MED FILL — GABAPENTIN 600 MG TABLET: 600 | 90 days supply | Qty: 90 | Fill #1

## 2020-08-15 MED FILL — LINZESS 290 MCG CAPSULE: 290 | 90 days supply | Qty: 90 | Fill #0

## 2020-08-16 ENCOUNTER — Ambulatory Visit: Payer: 59 | Admitting: Physical Therapy

## 2020-08-18 ENCOUNTER — Encounter: Payer: Self-pay | Admitting: Physical Therapy

## 2020-08-18 ENCOUNTER — Ambulatory Visit: Payer: 59 | Admitting: Physical Therapy

## 2020-08-18 ENCOUNTER — Other Ambulatory Visit: Payer: Self-pay

## 2020-08-18 DIAGNOSIS — M25611 Stiffness of right shoulder, not elsewhere classified: Secondary | ICD-10-CM

## 2020-08-18 DIAGNOSIS — M6281 Muscle weakness (generalized): Secondary | ICD-10-CM

## 2020-08-18 DIAGNOSIS — M25511 Pain in right shoulder: Secondary | ICD-10-CM

## 2020-08-18 NOTE — Therapy (Signed)
[place], [place], [address] Phone: [phone]   Fax:  [phone]  Physical Therapy Treatment/Discharge  Patient Details  Name: Deborah York MRN: [id number] Date of Birth: Oct 30, 1956 Referring Provider (PT): Aundria Mems, MD   Encounter Date: 08/18/2020   PT End of Session - 08/18/20 1714    Visit Number 27    Date for PT Re-Evaluation 08/27/20    Authorization Type UMR    PT Start Time 1709    PT Stop Time 1747    PT Time Calculation (min) 38 min    Activity Tolerance Patient tolerated treatment well    Behavior During Therapy [facility] for tasks assessed/performed           Past Medical History:  Diagnosis Date  . Hypertension   . Migraines     Past Surgical History:  Procedure Laterality Date  . TOE SURGERY      There were no vitals filed for this visit.   Subjective Assessment - 08/18/20 1712    Subjective has been a long week at work so I haven't been doing as much. Still snapping a little bit. Still working without weights.    Patient Stated Goals ROM, yard work- dirt/mulch bags, decrease pain, exercise- free weights, assist husband post CVA    Currently in Pain? Yes    Pain Score 2     Pain Location Shoulder    Pain Orientation Right    Pain Descriptors / Indicators Sore    Aggravating Factors  reaching              [facility] PT Assessment - 08/18/20 0001      Assessment   Medical Diagnosis s/p Rt proximal humerus fx    Referring Provider (PT) Aundria Mems, MD    Onset Date/Surgical Date 02/26/20      Observation/Other Assessments   Focus on Therapeutic Outcomes (FOTO)  35% limited      Sensation   Additional Comments WFL      AROM   Right Shoulder Flexion 150 Degrees    Right Shoulder ABduction 140 Degrees    Right Shoulder Internal Rotation --   T9   Right Shoulder External Rotation --   T4     PROM   Right Shoulder Flexion 154 Degrees    Right  Shoulder ABduction 150 Degrees    Right Shoulder Internal Rotation 68 Degrees    Right Shoulder External Rotation 70 Degrees      Strength   Right Shoulder Flexion 4+/5    Right Shoulder ABduction 4+/5                         [facility] Adult PT Treatment/Exercise - 08/18/20 0001      Exercises   Other Exercises  reviewedHEP- see scanned instructions                  PT Education - 08/18/20 1752    Education Details progress, HEP advance options    Person(s) Educated Patient    Methods Explanation;Demonstration;Handout    Comprehension Verbalized understanding;Returned demonstration            PT Short Term Goals - 05/19/20 1806      PT SHORT TERM GOAL #1   Title Pt will demo AROM without shoulder elevation    Status Achieved      PT SHORT TERM GOAL #2   Title full PROM available  Baseline mild limitations,see flowsheet    Status On-going             PT Long Term Goals - 08/18/20 1849      PT LONG TERM GOAL #1   Title Pt will be independent with rest breaks and postural adjustments during long days at work    Status Achieved      PT LONG TERM GOAL #2   Title Pt will be able to lift light weights in all ranges to begin return to long term UE exercise program    Baseline able in flexion, limited strength for weight in abduction motion    Status Partially Met      PT LONG TERM GOAL #3   Title Pt will be able to begin yard work again    Status Achieved      PT LONG TERM GOAL #4   Title pt will be able to complete household chores and care activities for husband without being limited by pain    Status Achieved      PT LONG TERM GOAL #5   Title pt will be able to lift at least 5 lb overhead without compensation for functional activities    Baseline unable in abduction motion    Status Not Met                 Plan - 08/18/20 1847    Clinical Impression Statement pt has made significant improvements since beginning PT. she  udnerstands that at this point she needs to continue to challenge straight plane and functional strength to return to PLOF. Has appropriate HEP for further challenge at home and will continue with this for a few months. she will return to PT PRN in the future if she feels that she needs andy further progressions/treatment of shoulder.    PT Treatment/Interventions ADLs/Self Care Home Management;Cryotherapy;Electrical Stimulation;Moist Heat;Iontophoresis 4mg /ml Dexamethasone;Therapeutic activities;Therapeutic exercise;Patient/family education;Neuromuscular re-education;Manual techniques;Taping;Dry needling;Passive range of motion    PT Home Exercise Plan [id number]    Consulted and Agree with Plan of Care Patient           Patient will benefit from skilled therapeutic intervention in order to improve the following deficits and impairments:  Pain, Postural dysfunction, Increased muscle spasms, Decreased activity tolerance, Decreased range of motion, Decreased strength, Impaired flexibility  Visit Diagnosis: Stiffness of right shoulder, not elsewhere classified  Muscle weakness (generalized)  Acute pain of right shoulder     Problem List Patient Active Problem List   Diagnosis Date Noted  . Right hand pain 03/25/2020  . Fracture of humeral head, right, closed 03/04/2020  . ADHD (attention deficit hyperactivity disorder) 01/29/2020  . Hypertension 01/29/2020  . Atrophic vaginitis 06/21/2017  . Migraine headache 06/13/2016  . Age-related facial wrinkles 12/04/2012  . Other specified hypertrophic and atrophic condition of skin 12/04/2012  . 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 Rosacea 12/04/2012  . Visual field defect 12/04/2012    PHYSICAL THERAPY DISCHARGE SUMMARY  Visits from Start of Care: 27  Current functional level related to goals / functional outcomes: See above   Remaining deficits: See above   Education / Equipment: Anatomy of condition, POC, HEP, exercise form/rationale    Plan: Patient agrees to discharge.  Patient goals were partially met. Patient is being discharged due to being pleased with the current functional level.  ?????     Olar Santini C. Jaila Schellhorn PT, DPT 08/18/20 6:51 PM   [facility] [place],  [place], [address] Phone: [phone]   Fax:  [phone]  Name: Deborah York MRN: [id number] Date of Birth: [birth year]

## 2020-08-19 ENCOUNTER — Ambulatory Visit (INDEPENDENT_AMBULATORY_CARE_PROVIDER_SITE_OTHER): Payer: 59 | Admitting: Sports Medicine

## 2020-08-19 DIAGNOSIS — S42291D Other displaced fracture of upper end of right humerus, subsequent encounter for fracture with routine healing: Secondary | ICD-10-CM | POA: Diagnosis not present

## 2020-08-19 DIAGNOSIS — R922 Inconclusive mammogram: Secondary | ICD-10-CM | POA: Diagnosis not present

## 2020-08-19 DIAGNOSIS — Z803 Family history of malignant neoplasm of breast: Secondary | ICD-10-CM | POA: Diagnosis not present

## 2020-08-19 DIAGNOSIS — R928 Other abnormal and inconclusive findings on diagnostic imaging of breast: Secondary | ICD-10-CM | POA: Diagnosis not present

## 2020-08-19 NOTE — Progress Notes (Signed)
    Procedures performed today:    None.  Independent interpretation of notes and tests performed by another provider:   None.  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 Fracture was completely extra-articular, she continues to do better with physical therapy but feels to have plateaued, she has symmetric flexion, abduction, external rotation but she lags 3 to 4 inches of internal rotation compared to the contralateral side. At this point she is a candidate for intra-articular injection, followed by manipulation under anesthesia should she not get full range of motion. She does have a breast biopsy coming up so she would like to defer this to another visit. I will see her when she is ready.    ___________________________________________ Gwen Her. Dianah Field, M.D., ABFM., CAQSM. Primary Care and [place] Instructor of [place] [employer] [facility] [employer]

## 2020-08-19 NOTE — Assessment & Plan Note (Signed)
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 Fracture was completely extra-articular, she continues to do better with physical therapy but feels to have plateaued, she has symmetric flexion, abduction, external rotation but she lags 3 to 4 inches of internal rotation compared to the contralateral side. At this point she is a candidate for intra-articular injection, followed by manipulation under anesthesia should she not get full range of motion. She does have a breast biopsy coming up so she would like to defer this to another visit. I will see her when she is ready.

## 2020-08-29 MED FILL — SUMATRIPTAN SUCC 100 MG TAB: 100 | 80 days supply | Qty: 24 | Fill #1

## 2020-09-02 ENCOUNTER — Ambulatory Visit: Payer: 59 | Admitting: Sports Medicine

## 2020-09-02 ENCOUNTER — Other Ambulatory Visit: Payer: Self-pay

## 2020-09-02 ENCOUNTER — Ambulatory Visit: Payer: Self-pay

## 2020-09-02 DIAGNOSIS — S42291D Other displaced fracture of upper end of right humerus, subsequent encounter for fracture with routine healing: Secondary | ICD-10-CM

## 2020-09-02 NOTE — Progress Notes (Signed)
    Procedures performed today:    Procedure: Real-time Ultrasound Guided  injection/hydrodistention of the right glenohumeral joint Device: Samsung HS60  Verbal informed consent obtained.  Time-out conducted.  Noted no overlying erythema, induration, or other signs of local infection.  Skin prepped in a sterile fashion.  Local anesthesia: Topical Ethyl chloride.  With sterile technique and under real time ultrasound guidance:  Noted a tiny amount of glenohumeral joint effusion, I advanced a 22-gauge spinal needle into the joint from a posterior approach, I then injected 1 cc Kenalog 40, 2 cc lidocaine, 2 cc bupivacaine, syringe switched and 10 cc sterile saline used to distend the joint. Completed without difficulty  Pain immediately resolved suggesting accurate placement of the medication.  I was able to manipulate her joint into full range of motion passively after the injection. Advised to call if fevers/chills, erythema, induration, drainage, or persistent bleeding.  Images permanently stored and available for review in PACS.  Impression: Technically successful ultrasound guided injection.  Independent interpretation of notes and tests performed by another provider:   None.  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 She has done a lot of physical therapy, but has essentially plateaued, flexion, abduction, external rotation are all fairly symmetric but lags 3 to 4 inches of internal rotation. Today we did an intra-articular hydrodistention of the glenohumeral joint with triamcinolone and saline. I was able to manipulate her shoulder and get full motion to internal rotation and flexion/abduction. I would like to  see her back in a month, I think the majority of her lack of motion is due to muscular weakness rather than bony/capsular impingement.     ___________________________________________ Gwen Her. Dianah Field, M.D., ABFM., CAQSM. Primary Care and [place] Instructor of [place] of [facility] of Medicine

## 2020-09-02 NOTE — Assessment & Plan Note (Signed)
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 She has done a lot of physical therapy, but has essentially plateaued, flexion, abduction, external rotation are all fairly symmetric but lags 3 to 4 inches of internal rotation. Today we did an intra-articular hydrodistention of the glenohumeral joint with triamcinolone and saline. I was able to manipulate her shoulder and get full motion to internal rotation and flexion/abduction. I would like to see her back in a month, I think the majority of her lack of motion is due to muscular weakness rather than bony/capsular impingement.

## 2020-09-09 ENCOUNTER — Other Ambulatory Visit (HOSPITAL_BASED_OUTPATIENT_CLINIC_OR_DEPARTMENT_OTHER): Payer: Self-pay | Admitting: Internal Medicine

## 2020-09-09 ENCOUNTER — Other Ambulatory Visit: Payer: Self-pay

## 2020-09-09 ENCOUNTER — Ambulatory Visit: Payer: 59 | Attending: Internal Medicine

## 2020-09-09 DIAGNOSIS — Z23 Encounter for immunization: Secondary | ICD-10-CM

## 2020-09-09 NOTE — Progress Notes (Signed)
   Covid-19 Vaccination Clinic  Name:  Deborah York    MRN: [id number] DOB: [birth year]  09/09/2020  Ms. Deborah York was observed post Covid-19 immunization for 15 minutes without incident. She was provided with Vaccine Information Sheet and instruction to access the V-Safe system.  Vaccinated by [facility]  Ms. Deborah York was instructed to call 911 with any severe reactions post vaccine: Marland Kitchen Difficulty breathing  . Swelling of face and throat  . A fast heartbeat  . A bad rash all over body  . Dizziness and weakness

## 2020-09-15 ENCOUNTER — Other Ambulatory Visit (HOSPITAL_BASED_OUTPATIENT_CLINIC_OR_DEPARTMENT_OTHER): Payer: Self-pay | Admitting: Family Medicine

## 2020-09-15 MED FILL — PFIZER-BIONTECH COVID-19 VA: 30 | 1 days supply | Qty: 0 | Fill #0

## 2020-09-16 MED FILL — METHYLPHENIDATE 10 MG TAB: 10 | 30 days supply | Qty: 90 | Fill #0

## 2020-09-20 MED FILL — NORETHIN-ETH ESTRAD 1 MG-5: 1-5 | 84 days supply | Qty: 84 | Fill #1

## 2020-10-03 MED FILL — AMLODIPINE BESYLATE 5 MG TA: 5 | 90 days supply | Qty: 90 | Fill #1

## 2020-10-06 ENCOUNTER — Other Ambulatory Visit (HOSPITAL_BASED_OUTPATIENT_CLINIC_OR_DEPARTMENT_OTHER): Payer: Self-pay | Admitting: Optometry

## 2020-10-06 DIAGNOSIS — H16141 Punctate keratitis, right eye: Secondary | ICD-10-CM | POA: Diagnosis not present

## 2020-10-06 MED FILL — FLUOROMETHOLONE 0.1% DROPS: 0.1 | 25 days supply | Qty: 5 | Fill #0

## 2020-10-07 ENCOUNTER — Other Ambulatory Visit: Payer: Self-pay

## 2020-10-07 ENCOUNTER — Ambulatory Visit: Payer: 59 | Admitting: Sports Medicine

## 2020-10-07 DIAGNOSIS — S42291D Other displaced fracture of upper end of right humerus, subsequent encounter for fracture with routine healing: Secondary | ICD-10-CM

## 2020-10-07 NOTE — Assessment & Plan Note (Signed)
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 She was working in the yard and unfortunately injured her bicep, no Popeye sign, but she does have a positive speeds and Yergason test, cuff strength is good, adding some biceps rehab, return as needed.

## 2020-10-07 NOTE — Progress Notes (Signed)
    Procedures performed today:    None.  Independent interpretation of notes and tests performed by another provider:   None.  Brief History, Exam, Impression, and Recommendations:    Fracture of humeral head, right, closed Deborah York is a very pleasant 64 year old female, she had an accidental fall now approximately 7-1/2 months ago that ended up with a three-part proximal humeral fracture, completely extra-articular with good congruity of the joint line, she did a lot of physical therapy but plateaued, ultimately lacking about 3 to 4 inches of external rotation, at the last visit I did an intra-articular hydrodistention/injection of the glenohumeral joint with triamcinolone and saline and she returns today for the most part symptom-free. She was working in the yard and unfortunately injured York bicep, no Popeye sign, but she does have a positive speeds and Yergason test, cuff strength is good, adding some biceps rehab, return as needed.    ___________________________________________ Deborah York. Dianah Field, M.D., ABFM., CAQSM. Primary Care and [place] Instructor of [place] of [facility] of Medicine

## 2020-10-12 ENCOUNTER — Other Ambulatory Visit (HOSPITAL_BASED_OUTPATIENT_CLINIC_OR_DEPARTMENT_OTHER): Payer: Self-pay | Admitting: Family Medicine

## 2020-10-12 MED FILL — METHYLPHENIDATE 10 MG TAB: 10 | 30 days supply | Qty: 90 | Fill #0

## 2020-10-12 MED FILL — DESVENLAFAXINE SUC ER 50 MG: 50 | 90 days supply | Qty: 90 | Fill #1

## 2020-10-12 MED FILL — INTRAROSA 6.5 MG INST: 6.5 | 28 days supply | Qty: 28 | Fill #2

## 2020-10-12 MED FILL — SPIRONOLACTONE 100 MG TAB: 100 | 90 days supply | Qty: 90 | Fill #0

## 2020-10-22 IMAGING — DX DG SHOULDER 2+V*R*
3 series · 3 of 3 positions shown · non-contrast
Comparison: Radiograph 03/25/2020

CLINICAL DATA: Rule out right shoulder fracture.

EXAM:
RIGHT SHOULDER - 2+ VIEW

[shoulder grashey]
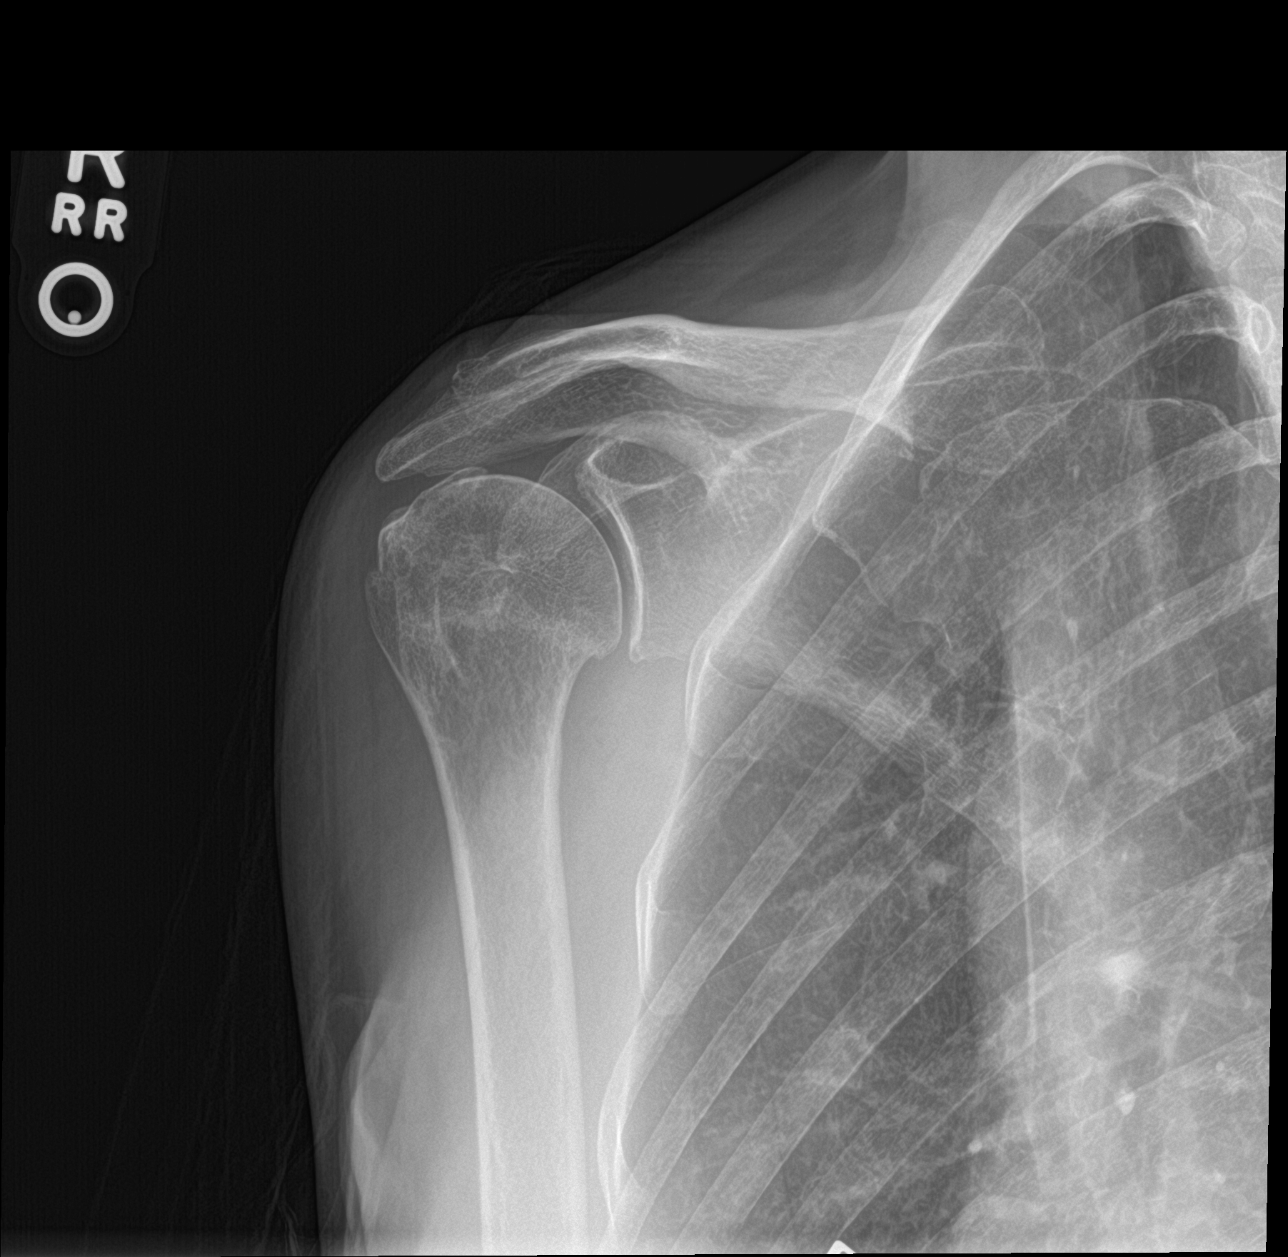

[shoulder y view]
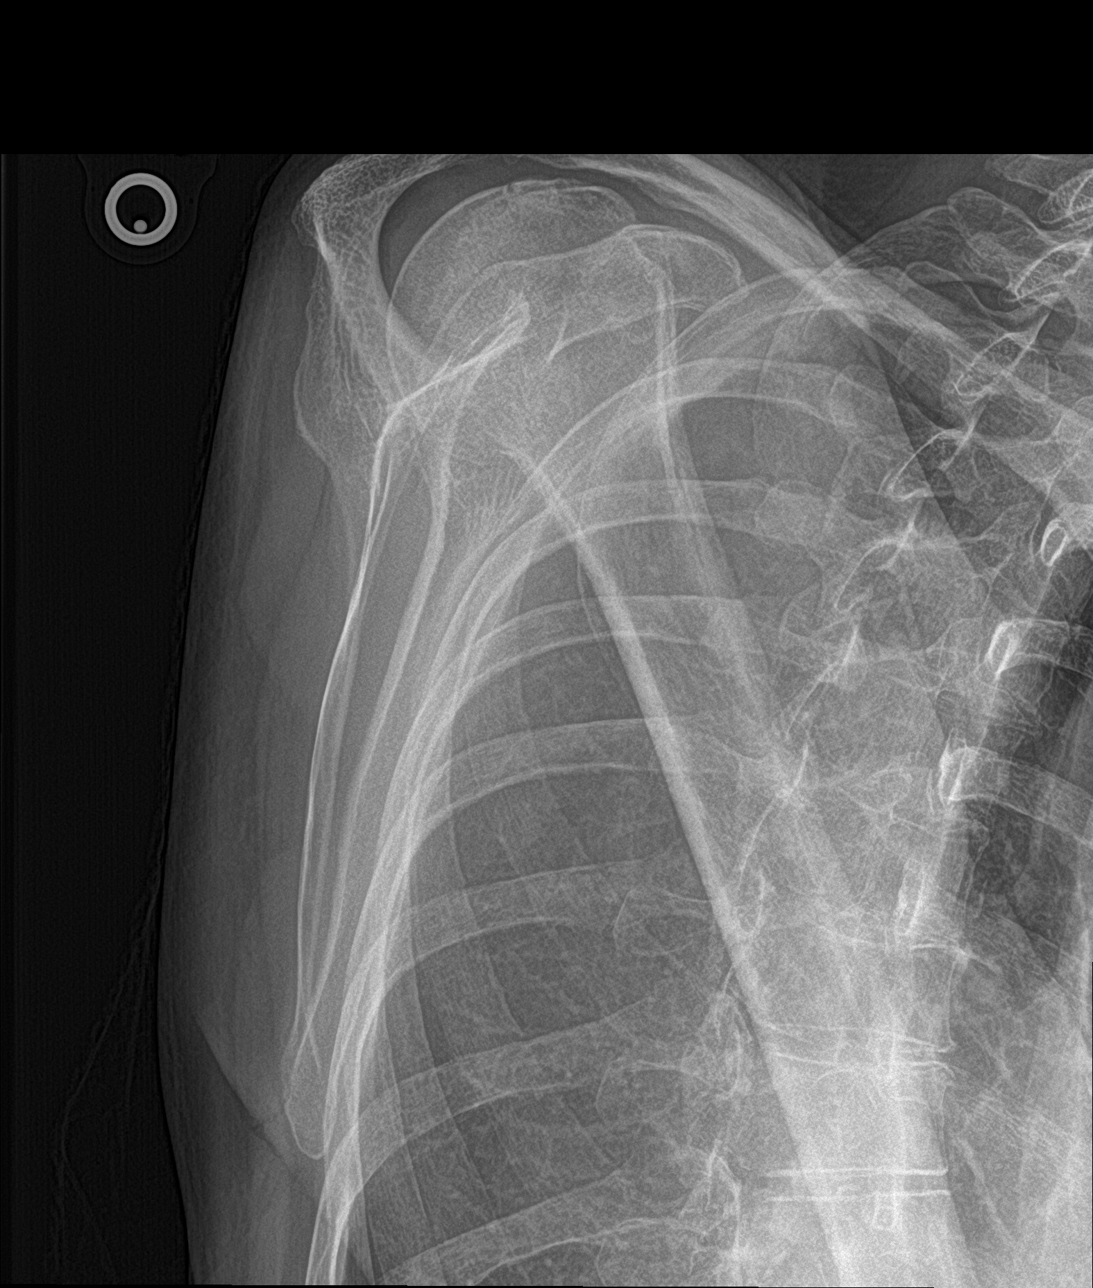

[shoulder axillary]
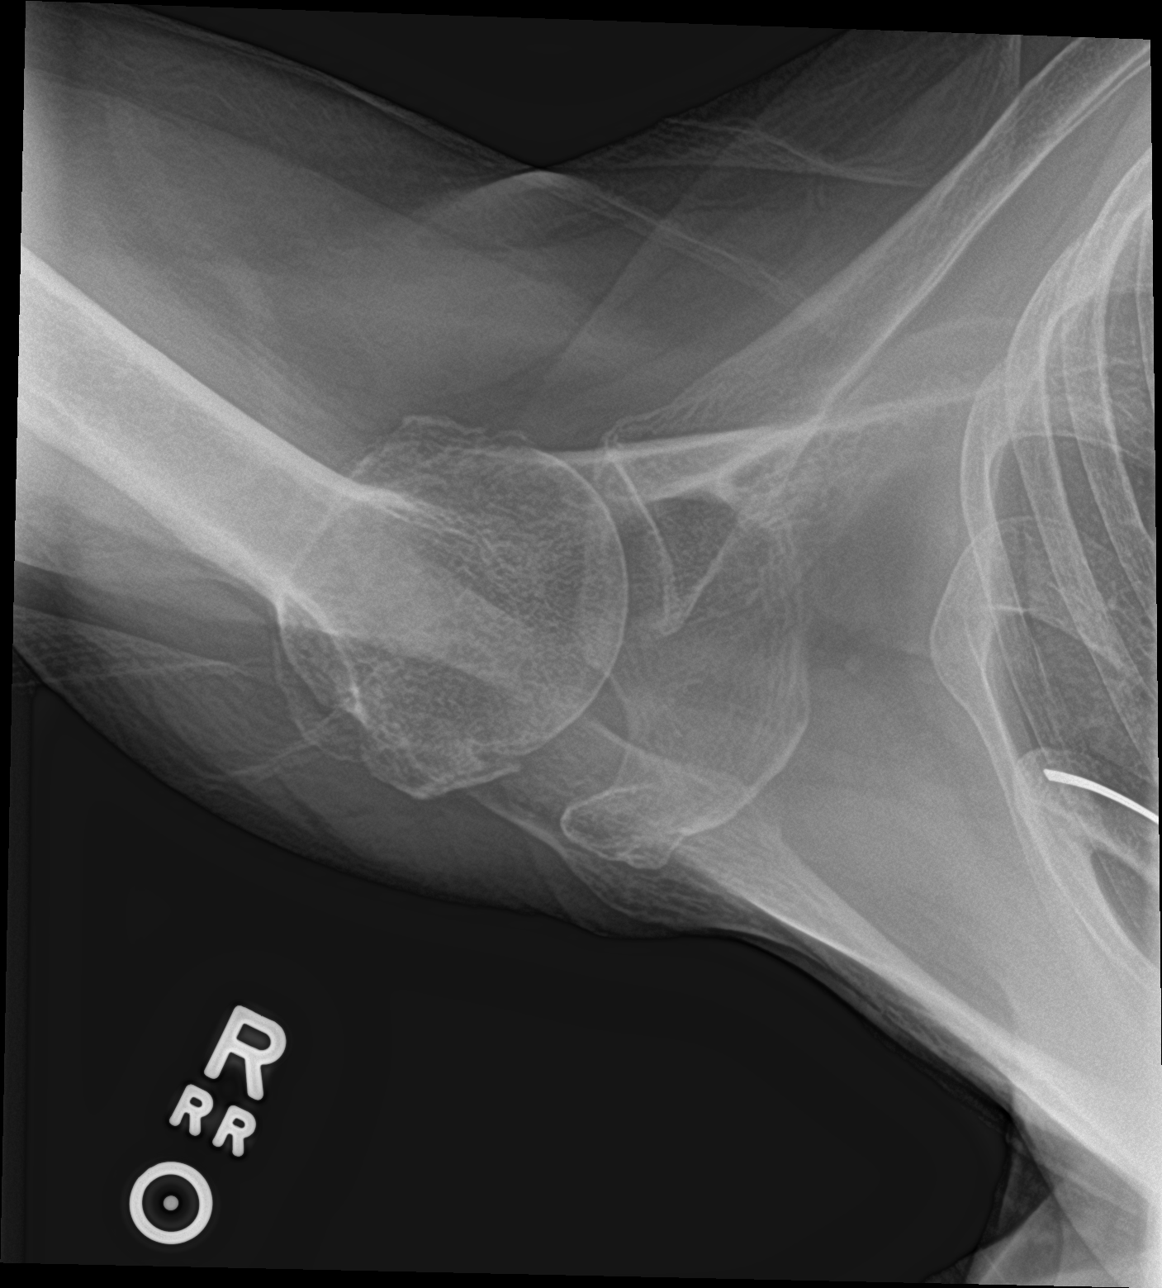

[3 of 3 positions shown; findings below may reference images not displayed]

FINDINGS: There is remote posttraumatic deformity of the proximal humerus with
a largely healed appearance of the previously seen comminuted
proximal right humeral fracture. Persistent band of sclerosis
extending through the anatomic neck of the right humerus which may
relate to periarticular overhanging osteophytes versus healing
fracture. Background of moderate glenohumeral and acromioclavicular
arthrosis. The humeral head remains normally seated within the
glenoid. No other acute or suspicious osseous abnormalities are
seen. No acute abnormality of the right lung or chest wall as
included.
IMPRESSION: 1. Largely healed appearance of the previously seen comminuted right
humeral fracture of the with a persistent band of sclerosis through
the anatomic neck can reflect a subacute or healing fracture or
projectional artifact from periarticular osteophytes.
2. Background of moderate glenohumeral and acromioclavicular
arthrosis.

## 2020-10-28 ENCOUNTER — Other Ambulatory Visit (HOSPITAL_BASED_OUTPATIENT_CLINIC_OR_DEPARTMENT_OTHER): Payer: Self-pay | Admitting: Family Medicine

## 2020-10-28 DIAGNOSIS — I7300 Raynaud's syndrome without gangrene: Secondary | ICD-10-CM | POA: Diagnosis not present

## 2020-10-28 DIAGNOSIS — G43909 Migraine, unspecified, not intractable, without status migrainosus: Secondary | ICD-10-CM | POA: Diagnosis not present

## 2020-10-28 DIAGNOSIS — Z0000 Encounter for general adult medical examination without abnormal findings: Secondary | ICD-10-CM | POA: Diagnosis not present

## 2020-10-28 MED FILL — ONDANSETRON ODT 4 MG TABLET: 4 | 10 days supply | Qty: 30 | Fill #0

## 2020-10-28 MED FILL — TELMISARTAN 20 MG TABS: 20 | 90 days supply | Qty: 90 | Fill #0

## 2020-11-01 DIAGNOSIS — I10 Essential (primary) hypertension: Secondary | ICD-10-CM | POA: Diagnosis not present

## 2020-11-04 ENCOUNTER — Other Ambulatory Visit (HOSPITAL_BASED_OUTPATIENT_CLINIC_OR_DEPARTMENT_OTHER): Payer: Self-pay | Admitting: Physician Assistant

## 2020-11-04 DIAGNOSIS — K581 Irritable bowel syndrome with constipation: Secondary | ICD-10-CM | POA: Diagnosis not present

## 2020-11-04 DIAGNOSIS — Z800 Family history of malignant neoplasm of digestive organs: Secondary | ICD-10-CM | POA: Diagnosis not present

## 2020-11-04 MED FILL — LINZESS 290 MCG CAPSULE: 290 | 90 days supply | Qty: 90 | Fill #0

## 2020-11-16 ENCOUNTER — Other Ambulatory Visit (HOSPITAL_BASED_OUTPATIENT_CLINIC_OR_DEPARTMENT_OTHER): Payer: Self-pay | Admitting: Family Medicine

## 2020-11-16 MED FILL — LINZESS 290 MCG CAPSULE: 290 | 90 days supply | Qty: 90 | Fill #0

## 2020-11-16 MED FILL — METHYLPHENIDATE 10 MG TAB: 10 | 30 days supply | Qty: 90 | Fill #0

## 2020-11-17 ENCOUNTER — Other Ambulatory Visit (HOSPITAL_BASED_OUTPATIENT_CLINIC_OR_DEPARTMENT_OTHER): Payer: Self-pay | Admitting: Optometry

## 2020-11-17 DIAGNOSIS — H16141 Punctate keratitis, right eye: Secondary | ICD-10-CM | POA: Diagnosis not present

## 2020-11-17 MED FILL — FLUOROMETHOLONE 0.1% DROPS: 0.1 | 15 days supply | Qty: 5 | Fill #0

## 2020-12-01 ENCOUNTER — Other Ambulatory Visit (HOSPITAL_BASED_OUTPATIENT_CLINIC_OR_DEPARTMENT_OTHER): Payer: Self-pay | Admitting: Family Medicine

## 2020-12-01 MED FILL — INTRAROSA 6.5 MG INST: 6.5 | 28 days supply | Qty: 28 | Fill #3

## 2020-12-01 MED FILL — SUMATRIPTAN SUCC 100 MG TAB: 100 | 30 days supply | Qty: 9 | Fill #0

## 2020-12-08 DIAGNOSIS — L570 Actinic keratosis: Secondary | ICD-10-CM | POA: Diagnosis not present

## 2020-12-09 ENCOUNTER — Ambulatory Visit: Payer: 59 | Admitting: Sports Medicine

## 2020-12-15 MED FILL — NORETHIN-ETH ESTRAD 1 MG-5: 1-5 | 84 days supply | Qty: 84 | Fill #2

## 2020-12-16 ENCOUNTER — Ambulatory Visit: Payer: Self-pay

## 2020-12-16 ENCOUNTER — Other Ambulatory Visit: Payer: Self-pay

## 2020-12-16 ENCOUNTER — Ambulatory Visit: Payer: 59 | Admitting: Sports Medicine

## 2020-12-16 DIAGNOSIS — S42291D Other displaced fracture of upper end of right humerus, subsequent encounter for fracture with routine healing: Secondary | ICD-10-CM

## 2020-12-16 MED FILL — METHYLPHENIDATE 10 MG TAB: 10 | 30 days supply | Qty: 90 | Fill #0

## 2020-12-16 NOTE — Assessment & Plan Note (Signed)
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 She did some physical therapy but plateaued ultimately lacking 3 to 4 inches of external rotation, we did an intra-articular hydrodistention/injection of the glenohumeral joint back in October, she did get some of her motion back but still has some lack of motion. I performed another hydrodistention today and we will get her to do some formal physical therapy, currently she lives approximately 30 degrees of external rotation on the right compared to the left. If we do not get good improvement in her motion with physical therapy I will refer her to Dr. Griffin Basil for consideration of manipulation under anesthesia.

## 2020-12-16 NOTE — Progress Notes (Signed)
    Procedures performed today:    Procedure: Real-time Ultrasound Guided  hydrodistention of the right glenohumeral joint Device: Samsung HS60  Verbal informed consent obtained.  Time-out conducted.  Noted no overlying erythema, induration, or other signs of local infection.  Skin prepped in a sterile fashion.  Local anesthesia: Topical Ethyl chloride.  With sterile technique and under real time ultrasound guidance:  Using a 22-gauge spinal needle advanced into the glenohumeral joint, 1 cc Kenalog 40, 2 cc lidocaine, 2 cc bupivacaine injected easily, syringe switched and 30 mL of saline used to distend the joint. Completed without difficulty  Advised to call if fevers/chills, erythema, induration, drainage, or persistent bleeding.  Images permanently stored and available for review in PACS.  Impression: Technically successful ultrasound guided injection.  Independent interpretation of notes and tests performed by another provider:   None.  Brief History, Exam, Impression, and Recommendations:    Fracture of humeral head, right, closed 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 She did some physical therapy but plateaued ultimately lacking 3 to 4 inches of external rotation, we did an intra-articular hydrodistention/injection of the glenohumeral joint back in October, she did get some of her motion back but still has some lack of motion. I performed another hydrodistention today and we will get her to do some formal physical therapy, currently she lives approximately 30 degrees of external rotation on the right compared to the left. If we do not get good improvement in her motion with physical therapy I will refer her to Dr. Griffin Basil for consideration of manipulation under  anesthesia.    ___________________________________________ Gwen Her. Dianah Field, M.D., ABFM., CAQSM. Primary Care and [place] Instructor of [place] of [facility] of Medicine

## 2021-01-12 ENCOUNTER — Other Ambulatory Visit: Payer: Self-pay

## 2021-01-12 ENCOUNTER — Ambulatory Visit: Payer: 59 | Attending: Sports Medicine

## 2021-01-12 DIAGNOSIS — G8929 Other chronic pain: Secondary | ICD-10-CM | POA: Insufficient documentation

## 2021-01-12 DIAGNOSIS — M6281 Muscle weakness (generalized): Secondary | ICD-10-CM | POA: Insufficient documentation

## 2021-01-12 DIAGNOSIS — M25611 Stiffness of right shoulder, not elsewhere classified: Secondary | ICD-10-CM | POA: Insufficient documentation

## 2021-01-12 DIAGNOSIS — M25511 Pain in right shoulder: Secondary | ICD-10-CM | POA: Diagnosis present

## 2021-01-12 NOTE — Therapy (Signed)
[place], [place], [address] Phone: [phone]   Fax:  [phone]  Physical Therapy Evaluation  Patient Details  Name: Deborah York MRN: [id number] Date of Birth: Nov 15, 1956 Referring Provider (PT): Silverio Decamp   Encounter Date: 01/12/2021   PT End of Session - 01/12/21 1847    Visit Number 1    Number of Visits 13    Date for PT Re-Evaluation 02/25/21    PT Start Time 6962    PT Stop Time 1915    PT Time Calculation (min) 42 min    Activity Tolerance Patient tolerated treatment well    Behavior During Therapy [facility] for tasks assessed/performed           Past Medical History:  Diagnosis Date  . Hypertension   . Migraines     Past Surgical History:  Procedure Laterality Date  . TOE SURGERY      There were no vitals filed for this visit.    Subjective Assessment - 01/12/21 1836    Subjective Patient reports she has done therapy before after her humerus fracture that she sustained in April 2021 where she fell directly onto her Rt shoulder. She reports making good progress during previous round of PT, but feels her motion is lacking again. She had a recent injection and is having more popping/snapping that can be painful since this, but feels the motion is improving. She has f/u with physician tomorrow. She reports pain with certain movements (rotational movements, putting on clothes) described as pinching of sudden onset that does not last long. She reports straining her biceps about 3 months ago and is afraid that she will reinjure this if she returns to strengthening activity and notices that the Rt arm is weaker.    Limitations Lifting;House hold activities    Patient Stated Goals "Help with mobility"    Currently in Pain? No/denies              [facility] PT Assessment - 01/12/21 0001      Assessment   Medical Diagnosis Closed fracture of head of right humerus with routine  healing, subsequent encounter    Referring Provider (PT) Silverio Decamp    Onset Date/Surgical Date 02/25/20    Hand Dominance Right    Next MD Visit 01/13/21    Prior Therapy yes      Precautions   Precautions None      Restrictions   Weight Bearing Restrictions No      Balance Screen   Has the patient fallen in the past 6 months No      [place] residence    Living Arrangements Spouse/significant other      Prior Function   Level of Independence Independent    Vocation Full time employment    Leisure gardening      Observation/Other Assessments   Focus on Therapeutic Outcomes (FOTO)  57% function; 68% predicted      Sensation   Light Touch Not tested      Posture/Postural Control   Posture/Postural Control Postural limitations    Postural Limitations Rounded Shoulders      AROM   Overall AROM Comments Lt shoulder AROM WNL; pain Rt shoulder AROM    Right Shoulder Flexion 135 Degrees    Right Shoulder ABduction 130 Degrees    Right Shoulder Internal Rotation 60 Degrees    Right Shoulder External Rotation 55 Degrees  PROM   Overall PROM Comments pain at end range    Right Shoulder Flexion 161 Degrees    Right Shoulder ABduction 155 Degrees    Right Shoulder Internal Rotation 70 Degrees    Right Shoulder External Rotation 67 Degrees      Strength   Overall Strength Comments pain with Rt shoulder MMT; elbow flexion/extension 5/5 bilaterally    Right Shoulder Flexion 4-/5    Right Shoulder ABduction 4/5    Right Shoulder Internal Rotation 5/5    Right Shoulder External Rotation 5/5    Left Shoulder Flexion 5/5    Left Shoulder ABduction 5/5    Left Shoulder Internal Rotation 5/5    Left Shoulder External Rotation 5/5      Palpation   Palpation comment Rt GH joint hypomobility all planes                      Objective measurements completed on examination: See above findings.       [place] Adult  PT Treatment/Exercise - 01/12/21 0001      Self-Care   Self-Care Other Self-Care Comments    Other Self-Care Comments  see patient education      Manual Therapy   Joint Mobilization Rt [place] joint mobilizations grade II-III inferior,A/P    Passive ROM Rt shoulder all planes to tolerance                  PT Education - 01/12/21 2044    Education Details Education on assessment findings, POC, HEP.    Person(s) Educated Patient    Methods Explanation;Demonstration;Verbal cues;Tactile cues;Handout    Comprehension Verbalized understanding;Returned demonstration            PT Short Term Goals - 01/12/21 1920      PT SHORT TERM GOAL #1   Title Patient will be independent with initial HEP.    Baseline issued at eval.    Time 3    Period Weeks    Status New    Target Date 02/02/21      PT SHORT TERM GOAL #2   Title PT will review FOTO score and expected progress.    Baseline FOTO score captured at eval.    Time 1    Period Weeks    Status New    Target Date 01/19/21             PT Long Term Goals - 01/12/21 2050      PT LONG TERM GOAL #1   Title Patient will demonstrate 5/5 strength in the Rt shoulder in order to return to UE exercise program.    Baseline see flowsheet    Time 6    Period Weeks    Status New    Target Date 02/23/21      PT LONG TERM GOAL #2   Title Patient will demonstrate at least 160 degrees of active Rt shoulder flexion and abduction to improve ability to reach overhead.    Baseline see flowsheet    Time 6    Period Weeks    Status New    Target Date 02/23/21      PT LONG TERM GOAL #3   Title Patient will demonstrate at least 70 degrees of Rt shoulder IR/ER AROM to improve ability to complete self-care activities.    Baseline see flowsheet    Time 6    Period Weeks    Status New    Target Date 02/23/21  PT LONG TERM GOAL #4   Title Patient will score 68% function on FOTO to signify clinically meaningful improvement in  functional abilities.    Baseline 57%    Time 6    Period Weeks    Status New    Target Date 02/23/21                  Plan - 01/12/21 1917    Clinical Impression Statement Patient is a 65 y/o Rt handed female who presents to [facility] with chief complaint of limited ROM and pain in the Rt shoulder secondary to proximal humerus fracture she sustained in April 2021 from a fall directly onto her Rt shoulder. She has previously received PT for this same complaint reporting good progress during her last plan of care. She currently presents with limited and painful A/PROM most notable into rotation, GH joint hypomobility, and mild weakness in the Rt shoulder. She will benefit from skilled PT to address above stated deficits in order to optimize function.    Personal Factors and Comorbidities Time since onset of injury/illness/exacerbation;Age    Examination-Activity Limitations Caring for [facility] Overhead    Examination-Participation Restrictions Cleaning;Shop;Yard Work    Stability/Clinical Decision Making Stable/Uncomplicated    Clinical Decision Making Low    Rehab Potential Good    PT Frequency --   1-2/week   PT Duration 6 weeks    PT Treatment/Interventions ADLs/Self Care Home Management;Cryotherapy;Electrical Stimulation;Moist Heat;Therapeutic activities;Therapeutic exercise;Neuromuscular re-education;Patient/family education;Manual techniques;Passive range of motion;Dry needling;Taping;Vasopneumatic Device    PT Next Visit Plan Review HEP. Review FOTO. Manual (PROM and joint mobilization), Progress AAROM (consider towel slides, finger ladder, pulley)    PT Home Exercise Plan Access Code: [email or web address]    Consulted and Agree with Plan of Care Patient           Patient will benefit from skilled therapeutic intervention in order to improve the following deficits and impairments:  Hypomobility,Decreased strength,Pain,Impaired UE functional use,Decreased range of  motion,Postural dysfunction  Visit Diagnosis: Stiffness of right shoulder, not elsewhere classified  Muscle weakness (generalized)  Chronic right shoulder pain     Problem List Patient Active Problem List   Diagnosis Date Noted  . Right hand pain 03/25/2020  . Fracture of humeral head, right, closed 03/04/2020  . ADHD (attention deficit hyperactivity disorder) 01/29/2020  . Hypertension 01/29/2020  . Atrophic vaginitis 06/21/2017  . Migraine headache 06/13/2016  . Age-related facial wrinkles 12/04/2012  . Other specified hypertrophic and atrophic condition of skin 12/04/2012  . 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 Rosacea 12/04/2012  . Visual field defect 12/04/2012   Gwendolyn Grant, PT, DPT, ATC 01/12/21 9:03 PM  [facility] [address] [place], [place], [address] Phone: [phone]   Fax:  [phone]  Name: Deborah York MRN: [id number] Date of Birth: November 23, 1955

## 2021-01-13 ENCOUNTER — Ambulatory Visit: Payer: 59 | Admitting: Sports Medicine

## 2021-01-13 ENCOUNTER — Other Ambulatory Visit (HOSPITAL_BASED_OUTPATIENT_CLINIC_OR_DEPARTMENT_OTHER): Payer: Self-pay | Admitting: Family Medicine

## 2021-01-13 DIAGNOSIS — S42291D Other displaced fracture of upper end of right humerus, subsequent encounter for fracture with routine healing: Secondary | ICD-10-CM | POA: Diagnosis not present

## 2021-01-13 MED FILL — INTRAROSA 6.5 MG INST: 6.5 | 84 days supply | Qty: 84 | Fill #0

## 2021-01-13 MED FILL — METHYLPHENIDATE HCL 10 MG T: 10 | 30 days supply | Qty: 90 | Fill #0

## 2021-01-13 MED FILL — DESVENLAFAXINE SUC ER 50 MG: 50 | 90 days supply | Qty: 90 | Fill #0

## 2021-01-13 NOTE — Assessment & Plan Note (Signed)
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 We did some physical therapy, ultimately she plateaued lacking about 3 to 4 inches of external rotation, we did an intra-articular hydrodistention/injection of the glenohumeral joint back in October 2021, she did get some of her motion back. At the last visit a month ago I did an additional intra-articular hydrodistention/injection of the glenohumeral joint, she returns today with external rotation better on the right side than the left, she does lack approximately 10 to 20 degrees of abduction, she has significant loss of internal rotation still, her forward flexion has only about a 10% lag. Overall I think she is doing extremely well, she is fully functional. As is typical with doctors, she has very high expectations, and I am happy to go ahead and get her a surgical opinion from Dr. Griffin Basil regarding arthroscopic lysis of adhesions versus manipulation under anesthesia.

## 2021-01-13 NOTE — Progress Notes (Signed)
    Procedures performed today:    None.  Independent interpretation of notes and tests performed by another provider:   None.  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 We did some physical therapy, ultimately she plateaued lacking about 3 to 4 inches of external rotation, we did an intra-articular hydrodistention/injection of the glenohumeral joint back in October 2021, she did get some of her motion back. At the last visit a month ago I did an additional intra-articular hydrodistention/injection of the glenohumeral joint, she returns today with external rotation better on the right side than the left, she does lack approximately 10 to 20 degrees of abduction, she has significant loss of internal rotation still, her forward flexion has only about a 10% lag. Overall I think she is doing extremely well, she is fully functional. As is typical with doctors, she has very high expectations, and I am happy to go ahead and get her a surgical opinion from Dr. Griffin Basil regarding arthroscopic lysis of adhesions versus manipulation under anesthesia.     ___________________________________________ Gwen Her. Dianah Field, M.D., ABFM., CAQSM. Primary Care and [place] Instructor of [place] of [facility] of Medicine

## 2021-01-20 ENCOUNTER — Other Ambulatory Visit: Payer: Self-pay | Admitting: Orthopaedic Surgery

## 2021-01-20 ENCOUNTER — Other Ambulatory Visit: Payer: Self-pay | Admitting: Internal Medicine

## 2021-01-20 ENCOUNTER — Other Ambulatory Visit (HOSPITAL_BASED_OUTPATIENT_CLINIC_OR_DEPARTMENT_OTHER): Payer: Self-pay

## 2021-01-20 DIAGNOSIS — M25511 Pain in right shoulder: Secondary | ICD-10-CM | POA: Diagnosis not present

## 2021-01-20 DIAGNOSIS — L02215 Cutaneous abscess of perineum: Secondary | ICD-10-CM | POA: Diagnosis not present

## 2021-01-20 DIAGNOSIS — N898 Other specified noninflammatory disorders of vagina: Secondary | ICD-10-CM | POA: Diagnosis not present

## 2021-01-20 MED FILL — DOXYCYCLINE HYCLATE 100 MG: 100 | 10 days supply | Qty: 20 | Fill #0

## 2021-01-20 MED FILL — METRONIDAZOLE 500 MG TABS: 500 | 10 days supply | Qty: 30 | Fill #0

## 2021-01-20 MED FILL — FLUCONAZOLE 150 MG TABS: 150 | 7 days supply | Qty: 2 | Fill #0

## 2021-01-23 ENCOUNTER — Other Ambulatory Visit (HOSPITAL_BASED_OUTPATIENT_CLINIC_OR_DEPARTMENT_OTHER): Payer: Self-pay | Admitting: Family Medicine

## 2021-01-23 MED FILL — GABAPENTIN 600 MG TABLET: 600 | 90 days supply | Qty: 90 | Fill #0

## 2021-01-25 ENCOUNTER — Other Ambulatory Visit (HOSPITAL_BASED_OUTPATIENT_CLINIC_OR_DEPARTMENT_OTHER): Payer: Self-pay | Admitting: Family Medicine

## 2021-01-25 MED FILL — MUPIROCIN 2% OINTMENT: 2 | 10 days supply | Qty: 22 | Fill #0

## 2021-01-27 ENCOUNTER — Other Ambulatory Visit (HOSPITAL_BASED_OUTPATIENT_CLINIC_OR_DEPARTMENT_OTHER): Payer: Self-pay | Admitting: Dermatology

## 2021-01-27 DIAGNOSIS — D225 Melanocytic nevi of trunk: Secondary | ICD-10-CM | POA: Diagnosis not present

## 2021-01-27 DIAGNOSIS — L859 Epidermal thickening, unspecified: Secondary | ICD-10-CM | POA: Diagnosis not present

## 2021-01-27 DIAGNOSIS — D0361 Melanoma in situ of right upper limb, including shoulder: Secondary | ICD-10-CM | POA: Diagnosis not present

## 2021-01-27 DIAGNOSIS — L821 Other seborrheic keratosis: Secondary | ICD-10-CM | POA: Diagnosis not present

## 2021-01-27 DIAGNOSIS — C44519 Basal cell carcinoma of skin of other part of trunk: Secondary | ICD-10-CM | POA: Diagnosis not present

## 2021-01-27 DIAGNOSIS — L570 Actinic keratosis: Secondary | ICD-10-CM | POA: Diagnosis not present

## 2021-01-27 DIAGNOSIS — D485 Neoplasm of uncertain behavior of skin: Secondary | ICD-10-CM | POA: Diagnosis not present

## 2021-01-27 MED FILL — RETIN-A 0.1 % CREA: 0.1 | 30 days supply | Qty: 45 | Fill #0

## 2021-02-02 ENCOUNTER — Ambulatory Visit: Payer: 59

## 2021-02-10 ENCOUNTER — Encounter: Payer: 59 | Admitting: Physical Therapy

## 2021-02-10 ENCOUNTER — Ambulatory Visit: Payer: 59 | Admitting: Sports Medicine

## 2021-02-10 ENCOUNTER — Other Ambulatory Visit: Payer: Self-pay

## 2021-02-10 ENCOUNTER — Ambulatory Visit
Admission: RE | Admit: 2021-02-10 | Discharge: 2021-02-10 | Disposition: A | Discharge status: Home / Self Care | Payer: 59 | Source: Ambulatory Visit | Attending: Orthopaedic Surgery | Admitting: Orthopaedic Surgery

## 2021-02-10 DIAGNOSIS — M25511 Pain in right shoulder: Secondary | ICD-10-CM

## 2021-02-10 DIAGNOSIS — M19011 Primary osteoarthritis, right shoulder: Secondary | ICD-10-CM | POA: Diagnosis not present

## 2021-02-15 ENCOUNTER — Other Ambulatory Visit (HOSPITAL_BASED_OUTPATIENT_CLINIC_OR_DEPARTMENT_OTHER): Payer: Self-pay | Admitting: Family Medicine

## 2021-02-15 MED FILL — METHYLPHENIDATE 10 MG TAB: 10 | 30 days supply | Qty: 90 | Fill #0

## 2021-02-15 MED FILL — TELMISARTAN 20 MG TABS: 20 | 90 days supply | Qty: 90 | Fill #1

## 2021-02-15 MED FILL — LINZESS 290 MCG CAPSULE: 290 | 90 days supply | Qty: 90 | Fill #1

## 2021-02-15 MED FILL — SPIRONOLACTONE 100 MG TAB: 100 | 90 days supply | Qty: 90 | Fill #1

## 2021-02-16 DIAGNOSIS — D0361 Melanoma in situ of right upper limb, including shoulder: Secondary | ICD-10-CM | POA: Diagnosis not present

## 2021-02-16 DIAGNOSIS — D485 Neoplasm of uncertain behavior of skin: Secondary | ICD-10-CM | POA: Diagnosis not present

## 2021-02-17 DIAGNOSIS — Z1211 Encounter for screening for malignant neoplasm of colon: Secondary | ICD-10-CM | POA: Diagnosis not present

## 2021-02-17 DIAGNOSIS — Z800 Family history of malignant neoplasm of digestive organs: Secondary | ICD-10-CM | POA: Diagnosis not present

## 2021-02-17 LAB — HM COLONOSCOPY

## 2021-02-21 ENCOUNTER — Other Ambulatory Visit (HOSPITAL_COMMUNITY): Payer: Self-pay | Admitting: *Deleted

## 2021-03-02 ENCOUNTER — Other Ambulatory Visit (HOSPITAL_BASED_OUTPATIENT_CLINIC_OR_DEPARTMENT_OTHER): Payer: Self-pay

## 2021-03-02 MED FILL — Sumatriptan Succinate Tab 100 MG: ORAL | 90 days supply | Qty: 24 | Fill #0 | Status: AC

## 2021-03-03 ENCOUNTER — Other Ambulatory Visit: Payer: Self-pay

## 2021-03-03 ENCOUNTER — Ambulatory Visit (HOSPITAL_BASED_OUTPATIENT_CLINIC_OR_DEPARTMENT_OTHER)
Admission: RE | Admit: 2021-03-03 | Discharge: 2021-03-03 | Disposition: A | Discharge status: Home / Self Care | Payer: Self-pay | Source: Ambulatory Visit | Attending: Cardiology | Admitting: Cardiology

## 2021-03-10 ENCOUNTER — Other Ambulatory Visit (HOSPITAL_BASED_OUTPATIENT_CLINIC_OR_DEPARTMENT_OTHER): Payer: Self-pay

## 2021-03-10 DIAGNOSIS — F900 Attention-deficit hyperactivity disorder, predominantly inattentive type: Secondary | ICD-10-CM | POA: Diagnosis not present

## 2021-03-10 DIAGNOSIS — Z79899 Other long term (current) drug therapy: Secondary | ICD-10-CM | POA: Diagnosis not present

## 2021-03-10 DIAGNOSIS — I700 Atherosclerosis of aorta: Secondary | ICD-10-CM | POA: Diagnosis not present

## 2021-03-10 DIAGNOSIS — I10 Essential (primary) hypertension: Secondary | ICD-10-CM | POA: Diagnosis not present

## 2021-03-10 MED ORDER — ROSUVASTATIN CALCIUM 10 MG PO TABS
10.0000 mg | ORAL_TABLET | Freq: Every day | ORAL | 3 refills | Status: DC
  Filled 2021-03-10: qty 90, 90d supply, fill #0
  Filled 2021-06-15: qty 90, 90d supply, fill #1

## 2021-03-10 MED ORDER — AMLODIPINE BESYLATE 10 MG PO TABS
1.0000 | ORAL_TABLET | Freq: Every day | ORAL | 3 refills | Status: DC
  Filled 2021-03-10: qty 90, 90d supply, fill #0

## 2021-03-14 ENCOUNTER — Other Ambulatory Visit (HOSPITAL_BASED_OUTPATIENT_CLINIC_OR_DEPARTMENT_OTHER): Payer: Self-pay

## 2021-03-15 ENCOUNTER — Other Ambulatory Visit (HOSPITAL_BASED_OUTPATIENT_CLINIC_OR_DEPARTMENT_OTHER): Payer: Self-pay

## 2021-03-16 ENCOUNTER — Encounter (HOSPITAL_BASED_OUTPATIENT_CLINIC_OR_DEPARTMENT_OTHER): Payer: Self-pay | Admitting: Orthopaedic Surgery

## 2021-03-16 ENCOUNTER — Other Ambulatory Visit (HOSPITAL_BASED_OUTPATIENT_CLINIC_OR_DEPARTMENT_OTHER): Payer: Self-pay

## 2021-03-16 ENCOUNTER — Other Ambulatory Visit: Payer: Self-pay

## 2021-03-17 ENCOUNTER — Other Ambulatory Visit (HOSPITAL_BASED_OUTPATIENT_CLINIC_OR_DEPARTMENT_OTHER): Payer: Self-pay

## 2021-03-20 ENCOUNTER — Other Ambulatory Visit (HOSPITAL_BASED_OUTPATIENT_CLINIC_OR_DEPARTMENT_OTHER): Payer: Self-pay

## 2021-03-20 ENCOUNTER — Other Ambulatory Visit (HOSPITAL_COMMUNITY)
Admission: RE | Admit: 2021-03-20 | Discharge: 2021-03-20 | Disposition: A | Discharge status: Home / Self Care | Payer: 59 | Source: Ambulatory Visit | Attending: Orthopaedic Surgery | Admitting: Orthopaedic Surgery

## 2021-03-20 DIAGNOSIS — Z20822 Contact with and (suspected) exposure to covid-19: Secondary | ICD-10-CM | POA: Insufficient documentation

## 2021-03-20 DIAGNOSIS — Z01812 Encounter for preprocedural laboratory examination: Secondary | ICD-10-CM | POA: Insufficient documentation

## 2021-03-20 MED ORDER — METHYLPHENIDATE HCL 10 MG PO TABS
ORAL_TABLET | ORAL | 0 refills | Status: DC
  Filled 2021-03-20: qty 90, 30d supply, fill #0

## 2021-03-21 ENCOUNTER — Encounter (HOSPITAL_BASED_OUTPATIENT_CLINIC_OR_DEPARTMENT_OTHER)
Admission: RE | Admit: 2021-03-21 | Discharge: 2021-03-21 | Disposition: A | Discharge status: Home / Self Care | Payer: 59 | Source: Ambulatory Visit | Attending: Orthopaedic Surgery | Admitting: Orthopaedic Surgery

## 2021-03-21 DIAGNOSIS — Z01818 Encounter for other preprocedural examination: Secondary | ICD-10-CM | POA: Insufficient documentation

## 2021-03-21 LAB — BASIC METABOLIC PANEL
Anion gap: 8 (ref 5–15)
BUN: 16 mg/dL (ref 8–23)
CO2: 26 mmol/L (ref 22–32)
Calcium: 9.2 mg/dL (ref 8.9–10.3)
Chloride: 101 mmol/L (ref 98–111)
Creatinine, Ser: 0.63 mg/dL (ref 0.44–1.00)
GFR, Estimated: >60 mL/min (ref >60)
Glucose, Bld: 134 mg/dL — ABNORMAL HIGH (ref 70–99)
Potassium: 4 mmol/L (ref 3.5–5.1)
Sodium: 135 mmol/L (ref 135–145)

## 2021-03-21 LAB — SARS CORONAVIRUS 2 (TAT 6-24 HRS): SARS Coronavirus 2: NEGATIVE

## 2021-03-21 NOTE — Progress Notes (Signed)

## 2021-03-22 NOTE — H&P (Signed)
PREOPERATIVE H&P  Chief Complaint: OSTEOARTHRITIS RIGHT SHOULDER,IMPINGEMENT SYNDROME RIGHT SHOULDER,ADHESIVE CAPSULITIS  HPI: Deborah York is a 65 y.o. female who is scheduled for, Procedure(s): SHOULDER ARTHROSCOPY WITH DISTAL CLAVICLE RESECTION IRRIGATION AND DEBRIDEMENT SHOULDER SHOULDER ACROMIOPLASTY LYSIS OF ADHESION WITH MANIPULATION.   Patient has a past medical history significant for HTN.   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 She has had an extensive  amount of therapy. She continues to have pain and has struggled with mechanical type symptoms in her shoulder. She lost range of motion. She is unhappy with her shoulder currently and wanted to know if there are any other alternatives that could be offered. She is very active otherwise. She is right hand dominant at baseline. She works  for the [employer].   Her symptoms are rated as moderate to severe, and have been worsening.  This is significantly impairing activities of daily living.    Please see clinic note for further details on this patient's care.    She has elected for surgical management.   Past Medical History:  Diagnosis Date  . Hypertension   . Migraines    Past Surgical History:  Procedure Laterality Date  . TOE SURGERY     Social History   Socioeconomic History  . Marital status: Married    Spouse name: Not on file  . Number of children: Not on file  . Years of education: Not on file  . Highest education level: Not on file  Occupational History  . Not on file  Tobacco Use  . Smoking status: Former Research scientist (life sciences)  . Smokeless tobacco: Former Network engineer  . Vaping Use: Never used  Substance and Sexual Activity  . Alcohol use: Not Currently  . Drug use: Never  . Sexual activity: Not on file  Other Topics Concern  . Not on file  Social History Narrative  . Not on file   Social Determinants of Health   Financial Resource Strain: Not on file   Food Insecurity: Not on file  Transportation Needs: Not on file  Physical Activity: Not on file  Stress: Not on file  Social Connections: Not on file   Family History  Problem Relation Age of Onset  . Cancer Mother   . Heart failure Father    Allergies  Allergen Reactions  . Sulfa Antibiotics Rash   Prior to Admission medications   Medication Sig Start Date End Date Taking? Authorizing Provider  amLODipine (NORVASC) 5 MG tablet TAKE 1 TABLET BY MOUTH ONCE DAILY 06/28/20 06/28/21 Yes Madelaine Bhat., MD  Calcium Carbonate-Vitamin D 600-400 MG-UNIT tablet Take 1 tablet by mouth 2 (two) times daily. 03/04/20  Yes Silverio Decamp, MD  cholecalciferol (VITAMIN D) 25 MCG (1000 UT) tablet Take by mouth.   Yes [provider]  desvenlafaxine (PRISTIQ) 50 MG 24 hr tablet  01/14/19  Yes [provider]  estradiol (ESTRACE) 0.1 MG/GM vaginal cream Apply intravaginally qhs x 1 week then 2-3 nights weekly 06/21/17  Yes [provider]  gabapentin (NEURONTIN) 600 MG tablet TAKE 1 TABLET BY MOUTH NIGHTLY AS NEEDED 01/23/21 01/23/22 Yes Lischke, Aimee S., MD  linaclotide (LINZESS) 290 MCG CAPS capsule TAKE ONE CAPSULE (290 MCG DOSE) BY MOUTH DAILY. 11/04/20 11/04/21 Yes Buckler, Elizabeth L, PA-C  methylphenidate (RITALIN) 10 MG tablet TAKE 1 TABLET BY MOUTH 3 TIMES DAILY 02/15/21 08/14/21 Yes Lischke, Eliott Nine., MD  Multiple Vitamins-Minerals (MULTIVITAMIN ADULT) TABS  Take by mouth.   Yes [provider]  norethindrone-ethinyl estradiol (FEMHRT 1/5) 1-5 MG-MCG TABS tablet  01/27/20  Yes [provider]  Prasterone 6.5 MG INST PLACE 1 INSERT VAGINALLY ONCE DAILY 01/13/21 01/13/22 Yes Lischke, Aimee S., MD  rosuvastatin (CRESTOR) 10 MG tablet Take 1 tablet (10 mg total) by mouth daily. 03/10/21  Yes   spironolactone (ALDACTONE) 100 MG tablet TAKE 1 TABLET BY MOUTH ONCE DAILY 10/12/20 10/12/21 Yes Vear Clock, PA-C  SUMAtriptan (IMITREX) 100 MG tablet TAKE  ONE TABLET BY MOUTH TWICE DAILY AS NEEDED FOR MIGRAINE, MAX 2 TABLETS PER DAY 12/01/20 12/01/21 Yes Lischke, Aimee S., MD  tretinoin (RETIN-A) 0.1 % cream APPLY 1 DIME-SIZED AMOUNT TO THE ENTIRE FACE NIGHTLY AS TOLERATED 01/27/21 01/27/22 Yes Ulla Gallo, MD  amLODipine (NORVASC) 10 MG tablet Take 1 tablet (10 mg total) by mouth daily. 03/10/21     amLODipine (NORVASC) 5 MG tablet  11/17/18   [provider]  desvenlafaxine (PRISTIQ) 50 MG 24 hr tablet TAKE 1 TABLET BY MOUTH ONCE DAILY 01/13/21 01/13/22  Clois Dupes., MD  desvenlafaxine (PRISTIQ) 50 MG 24 hr tablet TAKE 1 TABLET BY MOUTH ONCE DAILY 07/15/20 07/15/21  Madelaine Bhat., MD  gabapentin (NEURONTIN) 600 MG tablet  10/23/19   [provider]  Rolan Lipa 145 MCG CAPS capsule  01/27/20   [provider]  methylphenidate (RITALIN) 10 MG tablet  01/14/19   [provider]  methylphenidate (RITALIN) 10 MG tablet TAKE 1 TABLET BY MOUTH 3 TIMES DAILY 01/13/21 07/12/21  Clois Dupes., MD  methylphenidate (RITALIN) 10 MG tablet TAKE 1 TABLET BY MOUTH THREE TIMES DAILY 11/16/20 05/15/21  Madelaine Bhat., MD  methylphenidate (RITALIN) 10 MG tablet TAKE 1 TABLET BY MOUTH THREE TIMES DAILY 11/16/20 05/15/21  Madelaine Bhat., MD  methylphenidate (RITALIN) 10 MG tablet TAKE 1 TABLET BY MOUTH THREE TIMES DAILY 10/12/20 04/10/21  Vear Clock, PA-C  methylphenidate (RITALIN) 10 MG tablet TAKE 1 TABLET BY MOUTH THREE TIMES DAILY 09/15/20 03/14/21  Lischke, Eliott Nine., MD  methylphenidate (RITALIN) 10 MG tablet Take 1 tablet (10 mg total) by mouth 3 times daily. 03/17/21     norethindrone-ethinyl estradiol (FEMHRT 1/5) 1-5 MG-MCG TABS tablet TAKE 1 TABLET BY MOUTH ONCE DAILY 06/28/20 06/28/21  Madelaine Bhat., MD  Prasterone 6.5 MG INST PLACE ONE INSERT VAGINALLY DAILY 04/08/20 04/08/21  Clois Dupes., MD  spironolactone (ALDACTONE) 100 MG tablet  01/14/19   [provider]  SUMAtriptan (IMITREX) 100 MG  tablet  12/04/18   [provider]  SUMAtriptan (IMITREX) 100 MG tablet TAKE 1 TABLET BY MOUTH TWO TIMES DAILY AS NEEDED FOR MIGRAINE **MAX OF 2 TABLETS PER DAY** 06/01/20 06/01/21  Lischke, Aimee S., MD  tretinoin (RETIN-A) 0.1 % cream APPLY SPARINGLY TO AFFECTED AREA(S) ONCE DAILY AT BEDTIME. 01/26/18   [provider]    ROS: All other systems have been reviewed and were otherwise negative with the exception of those mentioned in the HPI and as above.  Physical Exam: General: Alert, no acute distress Cardiovascular: No pedal edema Respiratory: No cyanosis, no use of accessory musculature GI: No organomegaly, abdomen is soft and non-tender Skin: No lesions in the area of chief complaint Neurologic: Sensation intact distally Psychiatric: Patient is competent for consent with normal mood and affect Lymphatic: No axillary or cervical lymphadenopathy  MUSCULOSKELETAL:  The right shoulder reveals an active forward elevation to 150 degrees, passive is the same with a  hard endpoint.   External rotation to 40 degrees versus 80 degrees.  Internal rotation to T8 versus T2. Cuff strength is intact.   Imaging: X-rays were reviewed today and demonstrate  a possible previous head split fracture versus the base of the tuberosity fracture with prominence superior/posterior. There is also inferior glenohumeral osteophytes, early degenerative changes.   CT demonstrates some moderate joint space narrowing and some areas of full thickness cartilage loss.  She has some osteophytes that may be limiting her motion.  She additionally has AC arthrosis and type II acromion.  Her rotator cuff looks reasonable on her CT scan.    Assessment: OSTEOARTHRITIS RIGHT SHOULDER,IMPINGEMENT SYNDROME RIGHT SHOULDER,ADHESIVE CAPSULITIS  Plan: Plan for Procedure(s): SHOULDER ARTHROSCOPY WITH DISTAL CLAVICLE RESECTION IRRIGATION AND DEBRIDEMENT SHOULDER SHOULDER ACROMIOPLASTY LYSIS OF ADHESION WITH  MANIPULATION  The risks benefits and alternatives were discussed with the patient including but not limited to the risks of nonoperative treatment, versus surgical intervention including infection, bleeding, nerve injury,  blood clots, cardiopulmonary complications, morbidity, mortality, among others, and they were willing to proceed.   The patient acknowledged the explanation, agreed to proceed with the plan and consent was signed.   Operative Plan: Right shoulder scope with lysis of adhesions, subacromial decompression, distal clavicle excision and biceps tenodesis  Discharge Medications: Tylenol, diclofenac, oxycodone, zofran DVT Prophylaxis: none Physical Therapy: outpatient pt - early pt Special Discharge needs: sling. Sutton, PA-C  03/22/2021 7:53 AM

## 2021-03-23 ENCOUNTER — Ambulatory Visit (HOSPITAL_BASED_OUTPATIENT_CLINIC_OR_DEPARTMENT_OTHER): Payer: 59 | Admitting: Certified Registered"

## 2021-03-23 ENCOUNTER — Ambulatory Visit (HOSPITAL_BASED_OUTPATIENT_CLINIC_OR_DEPARTMENT_OTHER)
Admission: RE | Admit: 2021-03-23 | Discharge: 2021-03-23 | Disposition: A | Discharge status: Home / Self Care | Payer: 59 | Attending: Orthopaedic Surgery | Admitting: Orthopaedic Surgery

## 2021-03-23 ENCOUNTER — Other Ambulatory Visit: Payer: Self-pay

## 2021-03-23 ENCOUNTER — Other Ambulatory Visit (HOSPITAL_BASED_OUTPATIENT_CLINIC_OR_DEPARTMENT_OTHER): Payer: Self-pay

## 2021-03-23 ENCOUNTER — Encounter (HOSPITAL_BASED_OUTPATIENT_CLINIC_OR_DEPARTMENT_OTHER): Payer: Self-pay | Admitting: Orthopaedic Surgery

## 2021-03-23 ENCOUNTER — Encounter (HOSPITAL_BASED_OUTPATIENT_CLINIC_OR_DEPARTMENT_OTHER)
Admission: RE | Disposition: A | Discharge status: Home / Self Care | Payer: Self-pay | Source: Home / Self Care | Attending: Orthopaedic Surgery

## 2021-03-23 DIAGNOSIS — S42291P Other displaced fracture of upper end of right humerus, subsequent encounter for fracture with malunion: Secondary | ICD-10-CM | POA: Diagnosis not present

## 2021-03-23 DIAGNOSIS — Z8249 Family history of ischemic heart disease and other diseases of the circulatory system: Secondary | ICD-10-CM | POA: Diagnosis not present

## 2021-03-23 DIAGNOSIS — G8918 Other acute postprocedural pain: Secondary | ICD-10-CM | POA: Diagnosis not present

## 2021-03-23 DIAGNOSIS — Z79890 Hormone replacement therapy (postmenopausal): Secondary | ICD-10-CM | POA: Insufficient documentation

## 2021-03-23 DIAGNOSIS — M24611 Ankylosis, right shoulder: Secondary | ICD-10-CM | POA: Insufficient documentation

## 2021-03-23 DIAGNOSIS — Z87891 Personal history of nicotine dependence: Secondary | ICD-10-CM | POA: Insufficient documentation

## 2021-03-23 DIAGNOSIS — Z79899 Other long term (current) drug therapy: Secondary | ICD-10-CM | POA: Diagnosis not present

## 2021-03-23 DIAGNOSIS — M19011 Primary osteoarthritis, right shoulder: Secondary | ICD-10-CM | POA: Insufficient documentation

## 2021-03-23 DIAGNOSIS — M7541 Impingement syndrome of right shoulder: Secondary | ICD-10-CM | POA: Diagnosis not present

## 2021-03-23 DIAGNOSIS — Z882 Allergy status to sulfonamides status: Secondary | ICD-10-CM | POA: Insufficient documentation

## 2021-03-23 DIAGNOSIS — M7501 Adhesive capsulitis of right shoulder: Secondary | ICD-10-CM | POA: Insufficient documentation

## 2021-03-23 DIAGNOSIS — M25811 Other specified joint disorders, right shoulder: Secondary | ICD-10-CM | POA: Diagnosis not present

## 2021-03-23 DIAGNOSIS — I10 Essential (primary) hypertension: Secondary | ICD-10-CM | POA: Diagnosis not present

## 2021-03-23 DIAGNOSIS — X58XXXD Exposure to other specified factors, subsequent encounter: Secondary | ICD-10-CM | POA: Diagnosis not present

## 2021-03-23 DIAGNOSIS — S42391A Other fracture of shaft of right humerus, initial encounter for closed fracture: Secondary | ICD-10-CM | POA: Diagnosis not present

## 2021-03-23 DIAGNOSIS — M24111 Other articular cartilage disorders, right shoulder: Secondary | ICD-10-CM | POA: Diagnosis not present

## 2021-03-23 HISTORY — PX: SHOULDER ACROMIOPLASTY: SHX6093

## 2021-03-23 HISTORY — PX: IRRIGATION AND DEBRIDEMENT SHOULDER: SHX5880

## 2021-03-23 HISTORY — DX: Other complications of anesthesia, initial encounter: T88.59XA

## 2021-03-23 HISTORY — DX: Nausea with vomiting, unspecified: R11.2

## 2021-03-23 HISTORY — PX: LYSIS OF ADHESION: SHX5961

## 2021-03-23 HISTORY — DX: Other specified postprocedural states: Z98.890

## 2021-03-23 HISTORY — PX: SHOULDER ARTHROSCOPY WITH DISTAL CLAVICLE RESECTION: SHX5675

## 2021-03-23 SURGERY — SHOULDER ARTHROSCOPY WITH DISTAL CLAVICLE RESECTION
Anesthesia: Regional | Site: Shoulder | Laterality: Right

## 2021-03-23 MED ORDER — SODIUM CHLORIDE 0.9 % IR SOLN
Status: DC | PRN
  Administered 2021-03-23: 3000 mL

## 2021-03-23 MED ORDER — ONDANSETRON HCL 4 MG PO TABS
4.0000 mg | ORAL_TABLET | Freq: Three times a day (TID) | ORAL | 0 refills | Status: AC | PRN
  Filled 2021-03-23: qty 10, 4d supply, fill #0

## 2021-03-23 MED ORDER — ONDANSETRON HCL 4 MG/2ML IJ SOLN
INTRAMUSCULAR | Status: DC | PRN
  Administered 2021-03-23: 4 mg via INTRAVENOUS

## 2021-03-23 MED ORDER — CEFAZOLIN SODIUM-DEXTROSE 2-4 GM/100ML-% IV SOLN
INTRAVENOUS | Status: AC
  Filled 2021-03-23: qty 100

## 2021-03-23 MED ORDER — CEFAZOLIN SODIUM-DEXTROSE 2-4 GM/100ML-% IV SOLN
2.0000 g | INTRAVENOUS | Status: AC
  Administered 2021-03-23: 2 g via INTRAVENOUS

## 2021-03-23 MED ORDER — SODIUM CHLORIDE 0.9 % IR SOLN: Status: DC | PRN

## 2021-03-23 MED ORDER — FENTANYL CITRATE (PF) 100 MCG/2ML IJ SOLN
INTRAMUSCULAR | Status: AC
  Filled 2021-03-23: qty 2

## 2021-03-23 MED ORDER — DICLOFENAC SODIUM 75 MG PO TBEC
75.0000 mg | DELAYED_RELEASE_TABLET | Freq: Two times a day (BID) | ORAL | 0 refills | Status: DC
  Filled 2021-03-23: qty 60, 30d supply, fill #0

## 2021-03-23 MED ORDER — FENTANYL CITRATE (PF) 100 MCG/2ML IJ SOLN
25.0000 ug | INTRAMUSCULAR | Status: DC | PRN
  Administered 2021-03-23: 25 ug via INTRAVENOUS
  Administered 2021-03-23: 50 ug via INTRAVENOUS

## 2021-03-23 MED ORDER — OXYCODONE HCL 5 MG PO TABS: 5.0000 mg | ORAL_TABLET | Freq: Once | ORAL | Status: DC | PRN

## 2021-03-23 MED ORDER — BUPIVACAINE-EPINEPHRINE (PF) 0.5% -1:200000 IJ SOLN
INTRAMUSCULAR | Status: DC | PRN
  Administered 2021-03-23: 15 mL via PERINEURAL

## 2021-03-23 MED ORDER — LACTATED RINGERS IV SOLN: INTRAVENOUS | Status: DC

## 2021-03-23 MED ORDER — ONDANSETRON HCL 4 MG/2ML IJ SOLN
INTRAMUSCULAR | Status: AC
  Filled 2021-03-23: qty 2

## 2021-03-23 MED ORDER — ROCURONIUM BROMIDE 10 MG/ML (PF) SYRINGE
PREFILLED_SYRINGE | INTRAVENOUS | Status: AC
  Filled 2021-03-23: qty 10

## 2021-03-23 MED ORDER — MIDAZOLAM HCL 2 MG/2ML IJ SOLN
INTRAMUSCULAR | Status: AC
  Filled 2021-03-23: qty 2

## 2021-03-23 MED ORDER — ACETAMINOPHEN 10 MG/ML IV SOLN: 1000.0000 mg | Freq: Once | INTRAVENOUS | Status: DC | PRN

## 2021-03-23 MED ORDER — SUGAMMADEX SODIUM 500 MG/5ML IV SOLN
INTRAVENOUS | Status: AC
  Filled 2021-03-23: qty 5

## 2021-03-23 MED ORDER — ROCURONIUM BROMIDE 100 MG/10ML IV SOLN
INTRAVENOUS | Status: DC | PRN
  Administered 2021-03-23: 40 mg via INTRAVENOUS

## 2021-03-23 MED ORDER — BUPIVACAINE LIPOSOME 1.3 % IJ SUSP
INTRAMUSCULAR | Status: DC | PRN
  Administered 2021-03-23: 133 mg via PERINEURAL

## 2021-03-23 MED ORDER — LIDOCAINE 2% (20 MG/ML) 5 ML SYRINGE
INTRAMUSCULAR | Status: AC
  Filled 2021-03-23: qty 5

## 2021-03-23 MED ORDER — DEXAMETHASONE SODIUM PHOSPHATE 4 MG/ML IJ SOLN
INTRAMUSCULAR | Status: DC | PRN
  Administered 2021-03-23: 10 mg via INTRAVENOUS

## 2021-03-23 MED ORDER — BUPIVACAINE HCL (PF) 0.25 % IJ SOLN
INTRAMUSCULAR | Status: AC
  Filled 2021-03-23: qty 60

## 2021-03-23 MED ORDER — OXYCODONE HCL 5 MG PO TABS
ORAL_TABLET | ORAL | 0 refills | Status: AC
  Filled 2021-03-23: qty 16, 5d supply, fill #0

## 2021-03-23 MED ORDER — PROPOFOL 10 MG/ML IV BOLUS
INTRAVENOUS | Status: DC | PRN
  Administered 2021-03-23: 120 mg via INTRAVENOUS
  Administered 2021-03-23: 125 ug/kg/min via INTRAVENOUS

## 2021-03-23 MED ORDER — ACETAMINOPHEN 160 MG/5ML PO SOLN: 1000.0000 mg | Freq: Once | ORAL | Status: DC | PRN

## 2021-03-23 MED ORDER — OXYCODONE HCL 5 MG/5ML PO SOLN: 5.0000 mg | Freq: Once | ORAL | Status: DC | PRN

## 2021-03-23 MED ORDER — PROPOFOL 10 MG/ML IV BOLUS
INTRAVENOUS | Status: AC
  Filled 2021-03-23: qty 40

## 2021-03-23 MED ORDER — ACETAMINOPHEN 500 MG PO TABS
1000.0000 mg | ORAL_TABLET | Freq: Three times a day (TID) | ORAL | 0 refills | Status: AC
  Filled 2021-03-23: qty 100, 17d supply, fill #0

## 2021-03-23 MED ORDER — EPINEPHRINE PF 1 MG/ML IJ SOLN
INTRAMUSCULAR | Status: AC
  Filled 2021-03-23: qty 8

## 2021-03-23 MED ORDER — ACETAMINOPHEN 500 MG PO TABS: 1000.0000 mg | ORAL_TABLET | Freq: Once | ORAL | Status: DC | PRN

## 2021-03-23 MED ORDER — SUGAMMADEX SODIUM 200 MG/2ML IV SOLN
INTRAVENOUS | Status: DC | PRN
  Administered 2021-03-23: 150 mg via INTRAVENOUS

## 2021-03-23 MED ORDER — FENTANYL CITRATE (PF) 100 MCG/2ML IJ SOLN
100.0000 ug | Freq: Once | INTRAMUSCULAR | Status: AC
  Administered 2021-03-23: 75 ug via INTRAVENOUS

## 2021-03-23 MED ORDER — DIPHENHYDRAMINE HCL 50 MG/ML IJ SOLN
INTRAMUSCULAR | Status: DC | PRN
  Administered 2021-03-23: 12.5 mg via INTRAVENOUS

## 2021-03-23 MED ORDER — PHENYLEPHRINE HCL (PRESSORS) 10 MG/ML IV SOLN
INTRAVENOUS | Status: DC | PRN
  Administered 2021-03-23: 120 ug via INTRAVENOUS

## 2021-03-23 MED ORDER — DEXAMETHASONE SODIUM PHOSPHATE 10 MG/ML IJ SOLN
INTRAMUSCULAR | Status: AC
  Filled 2021-03-23: qty 1

## 2021-03-23 SURGICAL SUPPLY — 58 items
AID PSTN UNV HD RSTRNT DISP (MISCELLANEOUS) ×2
APL PRP STRL LF DISP 70% ISPRP (MISCELLANEOUS) ×2
BLADE EXCALIBUR 4.0X13 (MISCELLANEOUS) ×3 IMPLANT
BURR OVAL 8 FLU 4.0X13 (MISCELLANEOUS) IMPLANT
CANNULA 5.75X71 LONG (CANNULA) IMPLANT
CANNULA PASSPORT 5 (CANNULA) IMPLANT
CANNULA PASSPORT BUTTON 10-40 (CANNULA) IMPLANT
CANNULA TWIST IN 8.25X7CM (CANNULA) IMPLANT
CHLORAPREP W/TINT 26 (MISCELLANEOUS) ×3 IMPLANT
CLSR STERI-STRIP ANTIMIC 1/2X4 (GAUZE/BANDAGES/DRESSINGS) ×3 IMPLANT
COOLER ICEMAN CLASSIC (MISCELLANEOUS) ×3 IMPLANT
COVER WAND RF STERILE (DRAPES) IMPLANT
DRAPE IMP U-DRAPE 54X76 (DRAPES) ×3 IMPLANT
DRAPE INCISE IOBAN 66X45 STRL (DRAPES) IMPLANT
DRAPE SHOULDER BEACH CHAIR (DRAPES) ×3 IMPLANT
DRSG PAD ABDOMINAL 8X10 ST (GAUZE/BANDAGES/DRESSINGS) ×3 IMPLANT
DW OUTFLOW CASSETTE/TUBE SET (MISCELLANEOUS) ×3 IMPLANT
GAUZE SPONGE 4X4 12PLY STRL (GAUZE/BANDAGES/DRESSINGS) ×3 IMPLANT
GLOVE SRG 8 PF TXTR STRL LF DI (GLOVE) ×2 IMPLANT
GLOVE SURG ENC MOIS LTX SZ6.5 (GLOVE) ×3 IMPLANT
GLOVE SURG LTX SZ8 (GLOVE) ×3 IMPLANT
GLOVE SURG UNDER POLY LF SZ6.5 (GLOVE) ×3 IMPLANT
GLOVE SURG UNDER POLY LF SZ8 (GLOVE) ×3
GOWN STRL REUS W/ TWL LRG LVL3 (GOWN DISPOSABLE) ×4 IMPLANT
GOWN STRL REUS W/TWL LRG LVL3 (GOWN DISPOSABLE) ×6
GOWN STRL REUS W/TWL XL LVL3 (GOWN DISPOSABLE) ×3 IMPLANT
KIT STABILIZATION SHOULDER (MISCELLANEOUS) ×3 IMPLANT
KIT STR SPEAR 1.8 FBRTK DISP (KITS) IMPLANT
LASSO 90 CVE QUICKPAS (DISPOSABLE) IMPLANT
LASSO CRESCENT QUICKPASS (SUTURE) IMPLANT
MANIFOLD NEPTUNE II (INSTRUMENTS) ×3 IMPLANT
NDL SAFETY ECLIPSE 18X1.5 (NEEDLE) ×2 IMPLANT
NEEDLE HYPO 18GX1.5 SHARP (NEEDLE) ×3
NEEDLE SCORPION MULTI FIRE (NEEDLE) IMPLANT
PACK ARTHROSCOPY DSU (CUSTOM PROCEDURE TRAY) ×3 IMPLANT
PACK BASIN DAY SURGERY FS (CUSTOM PROCEDURE TRAY) ×3 IMPLANT
PAD COLD SHLDR UNI WRAP-ON (PAD) ×3
PAD COLD UNI WRAP-ON (PAD) ×2 IMPLANT
PAD ORTHO SHOULDER 7X19 LRG (SOFTGOODS) ×3 IMPLANT
PORT APPOLLO RF 90DEGREE MULTI (SURGICAL WAND) ×3 IMPLANT
RESTRAINT HEAD UNIVERSAL NS (MISCELLANEOUS) ×3 IMPLANT
SHEET MEDIUM DRAPE 40X70 STRL (DRAPES) ×3 IMPLANT
SLEEVE SCD COMPRESS KNEE MED (STOCKING) ×3 IMPLANT
SLING ARM FOAM STRAP LRG (SOFTGOODS) IMPLANT
SLING ARM FOAM STRAP MED (SOFTGOODS) ×3 IMPLANT
SUT FIBERWIRE #2 38 T-5 BLUE (SUTURE)
SUT MNCRL AB 4-0 PS2 18 (SUTURE) ×3 IMPLANT
SUT PDS AB 1 CT  36 (SUTURE)
SUT PDS AB 1 CT 36 (SUTURE) IMPLANT
SUT TIGER TAPE 7 IN WHITE (SUTURE) IMPLANT
SUTURE FIBERWR #2 38 T-5 BLUE (SUTURE) IMPLANT
SUTURE TAPE TIGERLINK 1.3MM BL (SUTURE) IMPLANT
SUTURETAPE TIGERLINK 1.3MM BL (SUTURE)
SYR 5ML LL (SYRINGE) ×3 IMPLANT
TAPE FIBER 2MM 7IN #2 BLUE (SUTURE) IMPLANT
TOWEL GREEN STERILE FF (TOWEL DISPOSABLE) ×6 IMPLANT
TUBE CONNECTING 20X1/4 (TUBING) ×3 IMPLANT
TUBING ARTHROSCOPY IRRIG 16FT (MISCELLANEOUS) ×3 IMPLANT

## 2021-03-23 NOTE — Interval H&P Note (Signed)
History and Physical Interval Note:  03/23/2021 7:11 AM  Deborah York  has presented today for surgery, with the diagnosis of [place] CAPSULITIS.  The various methods of treatment have been discussed with the patient and family. After consideration of risks, benefits and other options for treatment, the patient has consented to  Procedure(s): SHOULDER ARTHROSCOPY WITH DISTAL CLAVICLE RESECTION (Right) IRRIGATION AND DEBRIDEMENT SHOULDER (Right) SHOULDER ACROMIOPLASTY (Right) LYSIS OF ADHESION WITH MANIPULATION (Right) as a surgical intervention.  The patient's history has been reviewed, patient examined, no change in status, stable for surgery.  I have reviewed the patient's chart and labs.  Questions were answered to the patient's satisfaction.     [name]

## 2021-03-23 NOTE — Op Note (Signed)
Orthopaedic Surgery Operative Note (CSN: [id number])  Deborah York  December 06, 1955 Date of Surgery: 03/23/2021   Diagnoses:  Right shoulder malunion, arthrofibrosis, impingement and AC arthrosis  Procedure: Arthroscopic extensive debridement Arthroscopic subacromial decompression Arthroscopic distal clavicle excision Manipulation with lysis of adhesions  Osteoplasty humeral head malunion   Operative Finding Exam under anesthesia: Patient preoperatively had 45 degrees of external rotation with scapular isolation about 105 degrees of forward flexion, postoperatively she had 107 degrees of forward flexion and 90 of external rotation. Articular space: No loose bodies, capsular thickening was noted in the anterior inferior quadrant, fraying of the labrum essentially was present on the upper half of the glenoid.  She had ruptured her biceps. Chondral surfaces:Intact, though there was thinning and softening of the cartilage throughout consistent with moderate to early osteoarthritis.  Though there was a area of malunited bone that was intra-articular at the bicipital groove that likely contributed to her biceps rupture.  We performed a osteochondral plasty to smooth this in the effectively remove the impinging of this bone. Biceps: Traumatically ruptured Subscapularis: 10% upper border subscapularis fraying but the anchor was intact.  We felt that attempting a repair of the small amount of the subscapularis was not indicated is more than 50% of the tendon was intact and if we had done so she likely would have had a much higher risk of postoperative arthrofibrosis which would have opposed her goals of surgery. Superior Cuff: Mild fraying likely 10% articular sided fraying. Bursal side: Normal cuff, type II acromion converted to type I and a good distal clavicle excision performed.  Successful completion of the planned procedure.  Patient's motion was much improved after the manipulation and lysis of  adhesions.  Her osteoplasty of the humeral head helped with her internal rotation and avoidance of continued impingement.  Her biceps are traumatically ruptured and was not amenable to biceps tenodesis.  If the patient failed the surgery she would be a candidate for anatomic total shoulder arthroplasty based on her current condition.   Post-operative plan: The patient will be non-weightbearing in a sling for a week with range of motion aggressively with physical therapy.  The patient will be discharged home.  DVT prophylaxis not indicated in ambulatory upper extremity patient without known risk factors.   Pain control with PRN pain medication preferring oral medicines.  Follow up plan will be scheduled in approximately 7 days for incision check and XR.  Post-Op Diagnosis: Same with biceps rupture and upper border subscapularis fraying Surgeons:Primary: [name], MD Assistants:Caroline McBane PA-C Location: [facility] OR ROOM 6 Anesthesia: General with Exparel interscalene block Antibiotics: Ancef 2 g Tourniquet time: None Estimated Blood Loss: Minimal Complications: None Specimens: None Implants: * No implants in log *  Indications for Surgery:   Deborah York is a 65 y.o. female with continued shoulder pain refractory to nonoperative measures for extended period of time.  Patient had a proximal humerus fracture was managed nonoperatively by an outside physician and was referred to me due to lack of range of motion and continued pain in the shoulder.  She wanted to try and preserve her normal anatomy and avoid arthroplasty if possible.  Her CT scan demonstrated early osteoarthritis and a partial malunion of part of the humeral head.  Patient her clinical exam she had arthrofibrosis as well.  Her goals of surgery were to try improve her range of motion and preserve her normal anatomy for as long as possible understanding that she would be left  likely with continued pain though hopefully improved.   The risks and benefits were explained at length including but not limited to continued pain, cuff failure, biceps tenodesis failure, stiffness, need for further surgery and infection.   Procedure:   Patient was correctly identified in the preoperative holding area and operative site marked.  Patient brought to OR and positioned beachchair on an [place] table ensuring that all bony prominences were padded and the head was in an appropriate location.  Anesthesia was induced and the operative shoulder was prepped and draped in the usual sterile fashion.  Timeout was called preincision.  A standard posterior viewing portal was made after localizing the portal with a spinal needle.  An anterior accessory portal was also made.  After clearing the articular space the camera was positioned in the subacromial space.  Findings above.    Extensive debridement was performed of the anterior interval tissue, labral fraying and the bursa.   We identified the thickened MGHL and released this with a RF ablator.  We then used the RF ablator to skeletonize the anterior interval.  Once was complete we used a combination of the RF ablator as well as the basket device to complete a capsular release along the inferior half of the glenoid just off the glenoid labrum.  We stayed close to the labrum to avoid neurovascular damage.  At that point we removed all instruments and were able to manipulate the shoulder and demonstrate that there was improvement in range of motion as documented above.  We placed the scope back into the shoulder to ensure that there were no damage to the cuff or other structures in the shoulder.  We identified there was area of malunion on the humeral head anterior and superior.  This is right off the bicipital groove and was in the articular cartilage.  This was likely not going to engage until the extremes of motion but could have contributed to her biceps rupture as well as her loss of motion.  We felt  that an osteoplasty was appropriate.  We used a bur to shave this down to a smooth surface continuous with the rest of the humeral head.  Subacromial decompression: We made a lateral portal with spinal needle guidance. We then proceeded to debride bursal tissue extensively with a shaver and arthrocare device. At that point we continued to identify the borders of the acromion and identify the spur. We then carefully preserved the deltoid fascia and used a burr to convert the acromion to a Type 1 flat acromion without issue.  Distal Clavicle resection:  The scope was placed in the subacromial space from the posterior portal.  A hemostat was placed through the anterior portal and we spread at the [facility] joint.  A burr was then inserted and 10 mm of distal clavicle was resected taking care to avoid damage to the capsule around the joint and avoiding overhanging bone posteriorly.     The incisions were closed with absorbable monocryl and steri strips.  A sterile dressing was placed along with a sling. The patient was awoken from general anesthesia and taken to the PACU in stable condition without complication.   Noemi Chapel, PA-C, present and scrubbed throughout the case, critical for completion in a timely fashion, and for retraction, instrumentation, closure.

## 2021-03-23 NOTE — Progress Notes (Signed)
AssistedDr. Moser with right, ultrasound guided, interscalene  block. Side rails up, monitors on throughout procedure. See vital signs in flow sheet. Tolerated Procedure well.  

## 2021-03-23 NOTE — Discharge Instructions (Signed)
Ophelia Charter MD, MPH Noemi Chapel, PA-C [facility] [address] [phone] (tel)   [phone] (fax)   POST-OPERATIVE INSTRUCTIONS - SHOULDER ARTHROSCOPY  WOUND CARE . You may remove the Operative Dressing on Post-Op Day #3 (72hrs after surgery).   . Alternatively if you would like you can leave dressing on until follow-up if within 7-8 days but keep it dry. . Leave steri-strips in place until they fall off on their own, usually 2 weeks postop. . There may be a small amount of fluid/bleeding leaking at the surgical site.  o This is normal; the shoulder is filled with fluid during the procedure and can leak for 24-48hrs after surgery.  . You may change/reinforce the bandage as needed.  . Use the Cryocuff or Ice as often as possible for the first 7 days, then as needed for pain relief. Always keep a towel, ACE wrap or other barrier between the cooling unit and your skin.  . You may shower on Post-Op Day #3. Gently pat the area dry. Do not soak the shoulder in water or submerge it. Keep incisions as dry as possible. . Do not go swimming in the pool or ocean until 4 weeks after surgery or when otherwise instructed.    EXERCISES/BRACING ? Use your sling until your nerve block wears off ? After that you no longer need to use a sling ? Please schedule a physical therapy appointment ASAP if you have not already done so o This will be very important for your recovery   . Please continue to ambulate and do not stay sitting or lying for too long. Perform foot and wrist pumps to assist in circulation.  POST-OP MEDICATIONS- Multimodal approach to pain control . In general your pain will be controlled with a combination of substances.  Prescriptions unless otherwise discussed are electronically sent to your pharmacy.  This is a carefully made plan we use to minimize narcotic use.     ? Diclofenac - Anti-inflammatory medication taken on a scheduled basis ? Take  1 tablet twice a day ? Acetaminophen - Non-narcotic pain medicine taken on a scheduled basis  ? Take two 500 mg tablets (1,000mg  total) every 8 hours ? Oxycodone - This is a strong narcotic, to be used only on an "as needed" basis for severe pain. ? Zofran - take as needed for nausea  FOLLOW-UP . If you develop a Fever (?101.5), Redness or Drainage from the surgical incision site, please call our office to arrange for an evaluation. . Please call the office to schedule a follow-up appointment, if you have not already done so, 10-14 days post-operatively.    HELPFUL INFORMATION  . If you had a block, it will wear off between 8-24 hrs postop typically.  This is period when your pain may go from nearly zero to the pain you would have had postop without the block.  This is an abrupt transition but nothing dangerous is happening.  You may take an extra dose of narcotic when this happens.  . You may be more comfortable sleeping in a semi-seated position the first few nights following surgery.  Keep a pillow propped under the elbow and forearm for comfort.  If you have a recliner type of chair it might be beneficial.  If not that is fine too, but it would be helpful to sleep propped up with pillows behind your operated shoulder as well under your elbow and forearm.  This will reduce pulling on  the suture lines.  . When dressing, put your operative arm in the sleeve first.  When getting undressed, take your operative arm out last.  Loose fitting, button-down shirts are recommended.  Often in the first days after surgery you may be more comfortable keeping your operative arm under your shirt and not through the sleeve.  . You may return to work/school in the next couple of days when you feel up to it.  Desk work and typing in the sling is fine.  . We suggest you use the pain medication the first night prior to going to bed, in order to ease any pain when the anesthesia wears off. You should avoid taking  pain medications on an empty stomach as it will make you nauseous.  . You should wean off your narcotic medicines as soon as you are able.  Most patients will be off or using minimal narcotics before their first postop appointment.   . Do not drink alcoholic beverages or take illicit drugs when taking pain medications.  . It is against the law to drive while taking narcotics.  In some states it is against the law to drive while your arm is in a sling.   . Pain medication may make you constipated.  Below are a few solutions to try in this order: - Decrease the amount of pain medication if you aren't having pain. - Drink lots of decaffeinated fluids. - Drink prune juice and/or eat dried prunes  . If the first 3 don't work start with additional solutions - Take Colace - an over-the-counter stool softener - Take Senokot - an over-the-counter laxative - Take Miralax - a stronger over-the-counter laxative  For more information including helpful videos and documents visit our website:   [email or web address]    Post Anesthesia Home Care Instructions  Activity: Get plenty of rest for the remainder of the day. A responsible individual must stay with you for 24 hours following the procedure.  For the next 24 hours, DO NOT: -Drive a car -Paediatric nurse -Drink alcoholic beverages -Take any medication unless instructed by your physician -Make any legal decisions or sign important papers.  Meals: Start with liquid foods such as gelatin or soup. Progress to regular foods as tolerated. Avoid greasy, spicy, heavy foods. If nausea and/or vomiting occur, drink only clear liquids until the nausea and/or vomiting subsides. Call your physician if vomiting continues.  Special Instructions/Symptoms: Your throat may feel dry or sore from the anesthesia or the breathing tube placed in your throat during surgery. If this causes discomfort, gargle with warm salt water.  The discomfort should disappear within 24 hours.  If you had a scopolamine patch placed behind your ear for the management of post- operative nausea and/or vomiting:  1. The medication in the patch is effective for 72 hours, after which it should be removed.  Wrap patch in a tissue and discard in the trash. Wash hands thoroughly with soap and water. 2. You may remove the patch earlier than 72 hours if you experience unpleasant side effects which may include dry mouth, dizziness or visual disturbances. 3. Avoid touching the patch. Wash your hands with soap and water after contact with the patch.    Regional Anesthesia Blocks  1. Numbness or the inability to move the "blocked" extremity may last from 3-48 hours after placement. The length of time depends on the medication injected and your individual response to the medication. If the numbness is not going away after 48 hours, call  your surgeon.  2. The extremity that is blocked will need to be protected until the numbness is gone and the  Strength has returned. Because you cannot feel it, you will need to take extra care to avoid injury. Because it may be weak, you may have difficulty moving it or using it. You may not know what position it is in without looking at it while the block is in effect.  3. For blocks in the legs and feet, returning to weight bearing and walking needs to be done carefully. You will need to wait until the numbness is entirely gone and the strength has returned. You should be able to move your leg and foot normally before you try and bear weight or walk. You will need someone to be with you when you first try to ensure you do not fall and possibly risk injury.  4. Bruising and tenderness at the needle site are common side effects and will resolve in a few days.  5. Persistent numbness or new problems with movement should be communicated to the surgeon or the [place] [phone] [place] [phone]).Information for Discharge Teaching: EXPAREL (bupivacaine liposome injectable suspension)   Your surgeon or anesthesiologist gave you EXPAREL(bupivacaine) to help control your pain after surgery.   EXPAREL is a local anesthetic that provides pain relief by numbing the tissue around the surgical site.  EXPAREL is designed to release pain medication over time and can control pain for up to 72 hours.  Depending on how you respond to EXPAREL, you may require less pain medication during your recovery.  Possible side effects:  Temporary loss of sensation or ability to move in the area where bupivacaine was injected.  Nausea, vomiting, constipation  Rarely, numbness and tingling in your mouth or lips, lightheadedness, or anxiety may occur.  Call your doctor right away if you think you may be experiencing any of these sensations, or if you have other questions regarding possible side effects.  Follow all other discharge instructions given to you by your surgeon or nurse. Eat a healthy diet and drink plenty of water or other fluids.  If you return to the hospital for any reason within 96 hours following the administration of EXPAREL, it is important for health care providers to know that you have received this anesthetic. A teal colored band has been placed on your arm with the date, time and amount of EXPAREL you have received in order to alert and inform your health care providers. Please leave this armband in place for the full 96 hours following administration, and then you may remove the band.

## 2021-03-23 NOTE — Anesthesia Procedure Notes (Signed)
Anesthesia Regional Block: Interscalene brachial plexus block   Pre-Anesthetic Checklist: ,, timeout performed, Correct Patient, Correct Site, Correct Laterality, Correct Procedure, Correct Position, site marked, Risks and benefits discussed,  Surgical consent,  Pre-op evaluation,  At surgeon's request and post-op pain management  Laterality: Right and Upper  Prep: chloraprep       Needles:  Injection technique: Single-shot     Needle Length: 5cm  Needle Gauge: 22     Additional Needles: Arrow StimuQuik ECHO Echogenic Stimulating PNB Needle  Procedures:,,,, ultrasound used (permanent image in chart),,,,  Narrative:  Start time: 03/23/2021 7:28 AM End time: 03/23/2021 7:34 AM Injection made incrementally with aspirations every 5 mL.  Performed by: Personally  Anesthesiologist: Oleta Mouse, MD

## 2021-03-23 NOTE — Anesthesia Postprocedure Evaluation (Signed)
Anesthesia Post Note  Patient: Deborah York  Procedure(s) Performed: SHOULDER ARTHROSCOPY WITH DISTAL CLAVICLE RESECTION (Right Shoulder) IRRIGATION AND DEBRIDEMENT SHOULDER (Right Shoulder) SHOULDER ACROMIOPLASTY (Right Shoulder) LYSIS OF ADHESION WITH MANIPULATION (Right )     Patient location during evaluation: PACU Anesthesia Type: Regional and General Level of consciousness: awake and alert Pain management: pain level controlled Vital Signs Assessment: post-procedure vital signs reviewed and stable Respiratory status: spontaneous breathing, nonlabored ventilation and respiratory function stable Cardiovascular status: blood pressure returned to baseline and stable Postop Assessment: no apparent nausea or vomiting Anesthetic complications: no   No complications documented.  Last Vitals:  Vitals:   03/23/21 0915 03/23/21 0950  BP: 113/66 114/88  Pulse: 67 65  Resp: 18 16  Temp: (!) 36.4 C (!) 36.4 C  SpO2: 100% 100%    Last Pain:  Vitals:   03/23/21 0950  TempSrc:   PainSc: 2                  Lynda Rainwater

## 2021-03-23 NOTE — Transfer of Care (Signed)
Immediate Anesthesia Transfer of Care Note  Patient: Deborah York  Procedure(s) Performed: SHOULDER ARTHROSCOPY WITH DISTAL CLAVICLE RESECTION (Right Shoulder) IRRIGATION AND DEBRIDEMENT SHOULDER (Right Shoulder) SHOULDER ACROMIOPLASTY (Right Shoulder) LYSIS OF ADHESION WITH MANIPULATION (Right )  Patient Location: PACU  Anesthesia Type:General and Regional  Level of Consciousness: awake, alert  and oriented  Airway & Oxygen Therapy: Patient Spontanous Breathing and Patient connected to face mask oxygen  Post-op Assessment: Report given to RN and Post -op Vital signs reviewed and stable  Post vital signs: Reviewed and stable  Last Vitals:  Vitals Value Taken Time  BP    Temp    Pulse    Resp    SpO2      Last Pain:  Vitals:   03/23/21 0646  TempSrc: Oral  PainSc: 0-No pain         Complications: No complications documented.

## 2021-03-23 NOTE — Anesthesia Preprocedure Evaluation (Addendum)
Anesthesia Evaluation  Patient identified by MRN, date of birth, ID band Patient awake    Reviewed: Allergy & Precautions, NPO status , Patient's Chart, lab work & pertinent test results  History of Anesthesia Complications (+) PONV and history of anesthetic complications  Airway Mallampati: III  TM Distance: <3 FB Neck ROM: Full    Dental no notable dental hx. (+) Dental Advisory Given, Teeth Intact   Pulmonary neg shortness of breath, neg sleep apnea, neg COPD, neg recent URI, former smoker,  Covid-19 Nucleic Acid Test Results Lab Results      Component                Value               Date                      SARSCOV2NAA              NEGATIVE            03/20/2021              Pulmonary exam normal breath sounds clear to auscultation       Cardiovascular hypertension, Pt. on medications (-) angina(-) Past MI and (-) CHF Normal cardiovascular exam(-) dysrhythmias  Rhythm:Regular Rate:Normal     Neuro/Psych  Headaches, negative psych ROS   GI/Hepatic negative GI ROS, Neg liver ROS,   Endo/Other  negative endocrine ROS  Renal/GU negative Renal ROS     Musculoskeletal OSTEOARTHRITIS RIGHT SHOULDER,IMPINGEMENT SYNDROME RIGHT SHOULDER,ADHESIVE CAPSULITIS   Abdominal   Peds  Hematology No results found for: WBC, HGB, HCT, MCV, PLT    Anesthesia Other Findings   Reproductive/Obstetrics                           Anesthesia Physical Anesthesia Plan  ASA: II  Anesthesia Plan: General and Regional   Post-op Pain Management:  Regional for Post-op pain   Induction: Intravenous  PONV Risk Score and Plan: 4 or greater and Ondansetron, Dexamethasone, Propofol infusion and TIVA  Airway Management Planned: Oral ETT  Additional Equipment: None  Intra-op Plan:   Post-operative Plan: Extubation in OR  Informed Consent: I have reviewed the patients History and Physical, chart, labs and  discussed the procedure including the risks, benefits and alternatives for the proposed anesthesia with the patient or authorized representative who has indicated his/her understanding and acceptance.     Dental advisory given  Plan Discussed with: CRNA and Surgeon  Anesthesia Plan Comments:         Anesthesia Quick Evaluation

## 2021-03-23 NOTE — Anesthesia Procedure Notes (Signed)
Procedure Name: Intubation Performed by: Verita Lamb, CRNA Pre-anesthesia Checklist: Patient identified, Emergency Drugs available, Suction available and Patient being monitored Patient Re-evaluated:Patient Re-evaluated prior to induction Oxygen Delivery Method: Circle system utilized Preoxygenation: Pre-oxygenation with 100% oxygen Induction Type: IV induction Ventilation: Mask ventilation without difficulty Laryngoscope Size: Mac and 3 Grade View: Grade II Tube type: Oral Number of attempts: 1 Airway Equipment and Method: Stylet and Oral airway Placement Confirmation: ETT inserted through vocal cords under direct vision,  positive ETCO2,  breath sounds checked- equal and bilateral and CO2 detector Secured at: 21 cm Tube secured with: Tape Dental Injury: Teeth and Oropharynx as per pre-operative assessment

## 2021-03-24 ENCOUNTER — Encounter (HOSPITAL_BASED_OUTPATIENT_CLINIC_OR_DEPARTMENT_OTHER): Payer: Self-pay | Admitting: Orthopaedic Surgery

## 2021-03-24 DIAGNOSIS — M7501 Adhesive capsulitis of right shoulder: Secondary | ICD-10-CM | POA: Diagnosis not present

## 2021-03-31 DIAGNOSIS — M7501 Adhesive capsulitis of right shoulder: Secondary | ICD-10-CM | POA: Diagnosis not present

## 2021-03-31 DIAGNOSIS — M25511 Pain in right shoulder: Secondary | ICD-10-CM | POA: Diagnosis not present

## 2021-04-07 ENCOUNTER — Other Ambulatory Visit (HOSPITAL_BASED_OUTPATIENT_CLINIC_OR_DEPARTMENT_OTHER): Payer: Self-pay

## 2021-04-07 DIAGNOSIS — M7501 Adhesive capsulitis of right shoulder: Secondary | ICD-10-CM | POA: Diagnosis not present

## 2021-04-07 DIAGNOSIS — H524 Presbyopia: Secondary | ICD-10-CM | POA: Diagnosis not present

## 2021-04-07 MED ORDER — VUITY 1.25 % OP SOLN
OPHTHALMIC | 11 refills | Status: DC
  Filled 2021-04-07: qty 5, 50d supply, fill #0
  Filled 2021-05-17: qty 2.5, 20d supply, fill #0
  Filled 2021-10-15: qty 2.5, 20d supply, fill #1
  Filled 2022-02-22 – 2022-03-06 (×2): qty 2.5, 20d supply, fill #2

## 2021-04-10 ENCOUNTER — Other Ambulatory Visit (HOSPITAL_BASED_OUTPATIENT_CLINIC_OR_DEPARTMENT_OTHER): Payer: Self-pay

## 2021-04-11 ENCOUNTER — Other Ambulatory Visit (HOSPITAL_BASED_OUTPATIENT_CLINIC_OR_DEPARTMENT_OTHER): Payer: Self-pay

## 2021-04-12 ENCOUNTER — Other Ambulatory Visit (HOSPITAL_BASED_OUTPATIENT_CLINIC_OR_DEPARTMENT_OTHER): Payer: Self-pay

## 2021-04-13 ENCOUNTER — Other Ambulatory Visit (HOSPITAL_BASED_OUTPATIENT_CLINIC_OR_DEPARTMENT_OTHER): Payer: Self-pay

## 2021-04-14 ENCOUNTER — Other Ambulatory Visit (HOSPITAL_BASED_OUTPATIENT_CLINIC_OR_DEPARTMENT_OTHER): Payer: Self-pay

## 2021-04-14 DIAGNOSIS — M7501 Adhesive capsulitis of right shoulder: Secondary | ICD-10-CM | POA: Diagnosis not present

## 2021-04-17 MED FILL — Desvenlafaxine Succinate Tab ER 24HR 50 MG (Base Equiv): ORAL | 90 days supply | Qty: 90 | Fill #0 | Status: AC

## 2021-04-18 ENCOUNTER — Other Ambulatory Visit (HOSPITAL_BASED_OUTPATIENT_CLINIC_OR_DEPARTMENT_OTHER): Payer: Self-pay

## 2021-04-18 DIAGNOSIS — M7501 Adhesive capsulitis of right shoulder: Secondary | ICD-10-CM | POA: Diagnosis not present

## 2021-04-18 MED ORDER — METHYLPHENIDATE HCL 10 MG PO TABS
ORAL_TABLET | ORAL | 0 refills | Status: DC
  Filled 2021-04-18: qty 90, 30d supply, fill #0

## 2021-04-18 MED ORDER — METHYLPHENIDATE HCL 10 MG PO TABS
ORAL_TABLET | ORAL | 0 refills | Status: DC
  Filled 2021-04-18 – 2021-05-16 (×2): qty 90, 30d supply, fill #0

## 2021-04-19 ENCOUNTER — Other Ambulatory Visit (HOSPITAL_BASED_OUTPATIENT_CLINIC_OR_DEPARTMENT_OTHER): Payer: Self-pay

## 2021-04-25 DIAGNOSIS — M7501 Adhesive capsulitis of right shoulder: Secondary | ICD-10-CM | POA: Diagnosis not present

## 2021-04-26 ENCOUNTER — Other Ambulatory Visit (HOSPITAL_BASED_OUTPATIENT_CLINIC_OR_DEPARTMENT_OTHER): Payer: Self-pay

## 2021-04-28 DIAGNOSIS — M7501 Adhesive capsulitis of right shoulder: Secondary | ICD-10-CM | POA: Diagnosis not present

## 2021-04-30 MED FILL — Norethindrone Acetate-Ethinyl Estradiol Tab 1 MG-5 MCG: ORAL | 84 days supply | Qty: 84 | Fill #0 | Status: AC

## 2021-05-01 ENCOUNTER — Other Ambulatory Visit (HOSPITAL_BASED_OUTPATIENT_CLINIC_OR_DEPARTMENT_OTHER): Payer: Self-pay

## 2021-05-03 ENCOUNTER — Other Ambulatory Visit (INDEPENDENT_AMBULATORY_CARE_PROVIDER_SITE_OTHER): Payer: Self-pay | Admitting: Family Medicine

## 2021-05-03 ENCOUNTER — Other Ambulatory Visit (HOSPITAL_BASED_OUTPATIENT_CLINIC_OR_DEPARTMENT_OTHER): Payer: Self-pay

## 2021-05-03 DIAGNOSIS — U071 COVID-19: Secondary | ICD-10-CM

## 2021-05-03 MED ORDER — NIRMATRELVIR/RITONAVIR (PAXLOVID)TABLET
3.0000 | ORAL_TABLET | Freq: Two times a day (BID) | ORAL | 0 refills | Status: AC
  Filled 2021-05-03: qty 30, 5d supply, fill #0

## 2021-05-12 DIAGNOSIS — Z79899 Other long term (current) drug therapy: Secondary | ICD-10-CM | POA: Diagnosis not present

## 2021-05-16 ENCOUNTER — Other Ambulatory Visit (HOSPITAL_BASED_OUTPATIENT_CLINIC_OR_DEPARTMENT_OTHER): Payer: Self-pay

## 2021-05-16 MED ORDER — TELMISARTAN 20 MG PO TABS
ORAL_TABLET | ORAL | 3 refills | Status: DC
  Filled 2021-05-16: qty 90, 90d supply, fill #0

## 2021-05-16 MED FILL — Linaclotide Cap 290 MCG: ORAL | 90 days supply | Qty: 90 | Fill #0 | Status: AC

## 2021-05-16 MED FILL — Spironolactone Tab 100 MG: ORAL | 90 days supply | Qty: 90 | Fill #0 | Status: AC

## 2021-05-17 ENCOUNTER — Other Ambulatory Visit (HOSPITAL_BASED_OUTPATIENT_CLINIC_OR_DEPARTMENT_OTHER): Payer: Self-pay

## 2021-05-18 ENCOUNTER — Other Ambulatory Visit (HOSPITAL_BASED_OUTPATIENT_CLINIC_OR_DEPARTMENT_OTHER): Payer: Self-pay

## 2021-05-19 ENCOUNTER — Other Ambulatory Visit (HOSPITAL_BASED_OUTPATIENT_CLINIC_OR_DEPARTMENT_OTHER): Payer: Self-pay

## 2021-05-19 ENCOUNTER — Ambulatory Visit: Payer: 59 | Admitting: Family Medicine

## 2021-05-19 ENCOUNTER — Encounter: Payer: Self-pay | Admitting: Family Medicine

## 2021-05-19 ENCOUNTER — Other Ambulatory Visit: Payer: Self-pay

## 2021-05-19 VITALS — BP 130/71 | HR 89 | Temp 98.5°F | Resp 20 | Ht 67.0 in | Wt 139.4 lb

## 2021-05-19 DIAGNOSIS — K5909 Other constipation: Secondary | ICD-10-CM | POA: Diagnosis not present

## 2021-05-19 DIAGNOSIS — G43819 Other migraine, intractable, without status migrainosus: Secondary | ICD-10-CM | POA: Diagnosis not present

## 2021-05-19 DIAGNOSIS — F341 Dysthymic disorder: Secondary | ICD-10-CM | POA: Diagnosis not present

## 2021-05-19 DIAGNOSIS — I10 Essential (primary) hypertension: Secondary | ICD-10-CM | POA: Diagnosis not present

## 2021-05-19 DIAGNOSIS — F900 Attention-deficit hyperactivity disorder, predominantly inattentive type: Secondary | ICD-10-CM

## 2021-05-19 DIAGNOSIS — N952 Postmenopausal atrophic vaginitis: Secondary | ICD-10-CM | POA: Diagnosis not present

## 2021-05-19 DIAGNOSIS — M7501 Adhesive capsulitis of right shoulder: Secondary | ICD-10-CM | POA: Diagnosis not present

## 2021-05-19 MED ORDER — FLUCONAZOLE 150 MG PO TABS
150.0000 mg | ORAL_TABLET | Freq: Once | ORAL | 6 refills | Status: AC
  Filled 2021-05-19: qty 2, 2d supply, fill #0

## 2021-05-19 NOTE — Patient Instructions (Signed)
Very nice to meet you today! Let me know when you need refills on medication See me again in 6 months.

## 2021-05-21 ENCOUNTER — Encounter: Payer: Self-pay | Admitting: Family Medicine

## 2021-05-21 DIAGNOSIS — K5909 Other constipation: Secondary | ICD-10-CM | POA: Insufficient documentation

## 2021-05-21 DIAGNOSIS — F341 Dysthymic disorder: Secondary | ICD-10-CM | POA: Insufficient documentation

## 2021-05-21 NOTE — Assessment & Plan Note (Signed)
Blood pressure is at goal at for age and co-morbidities.  I recommend continuation of current medications.  In addition they were instructed to follow a low sodium diet with regular exercise to help to maintain adequate control of blood pressure.  ? ?

## 2021-05-21 NOTE — Assessment & Plan Note (Addendum)
She has noted some changing and increased frequency of migraines.  She would like to follow-up with neurology, referral placed.

## 2021-05-21 NOTE — Progress Notes (Signed)
Deborah York - 65 y.o. female MRN [id number]  Date of birth: 11/27/1955  Subjective Chief Complaint  Patient presents with   Establish Care    HPI Dr. Ines York is a 65 year old female here today for initial visit to establish care.  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  She has a history of hypertension, migraines, dysthymia chronic constipation, hyperlipidemia, ADD and postmenopausal atrophic vaginitis.  Had [place] recently.  Recovered well from this.  Her hypertension has been managed with telmisartan and amlodipine.  She is doing well with these medications.  She denies side effects related to current medications including symptoms of hypotension.  She has not had any chest pain, shortness of breath, palpitations related to her hypertension.  She does have comorbid hyperlipidemia as well she is taking rosuvastatin for this and denies myalgias related to current use.  She is also on methylphenidate 10 mg 3 times daily for management of ADD.  She has been on this for several years and finds that this works fairly well for her.  She has not noted any significant side effects related to this medication including sleep difficulty, palpitations or increased anxiety.  She has some mild dysthymia which has been well controlled with Pristiq.  No issues with tolerance of this.  She is not sure if she still needs to remain on this however feels pretty good at this time so would rather continue.  She is on HRT for postmenopausal atrophic vaginitis.  She is currently using combination estradiol and progesterone as well as DHEA.  This provides good relief of her symptoms.  She does occasionally get yeast infections and requests to have a prescription of fluconazole to keep on hand.  She has noted increased frequency and migraines.  She is taking Imitrex as needed for management of these.  She would like to see Dr. [name] in neurology for this.  Chronic constipation has been  well managed with Linzess as needed.  ROS:  A comprehensive ROS was completed and negative except as noted per HPI  Allergies  Allergen Reactions   Sulfa Antibiotics Rash    Past Medical History:  Diagnosis Date   Complication of anesthesia    Hypertension    Migraines    PONV (postoperative nausea and vomiting)     Past Surgical History:  Procedure Laterality Date   IRRIGATION AND DEBRIDEMENT SHOULDER Right 03/23/2021   Procedure: IRRIGATION AND DEBRIDEMENT SHOULDER;  Surgeon: [name], MD;  Location: [facility];  Service: Orthopedics;  Laterality: Right;   LYSIS OF ADHESION Right 03/23/2021   Procedure: LYSIS OF ADHESION WITH MANIPULATION;  Surgeon: [name], MD;  Location: [facility];  Service: Orthopedics;  Laterality: Right;   SHOULDER ACROMIOPLASTY Right 03/23/2021   Procedure: SHOULDER ACROMIOPLASTY;  Surgeon: [name], MD;  Location: [facility];  Service: Orthopedics;  Laterality: Right;   SHOULDER ARTHROSCOPY WITH DISTAL CLAVICLE RESECTION Right 03/23/2021   Procedure: SHOULDER ARTHROSCOPY WITH DISTAL CLAVICLE RESECTION;  Surgeon: [name], MD;  Location: [facility];  Service: Orthopedics;  Laterality: Right;   TOE SURGERY      Social History   Socioeconomic History   Marital status: Married    Spouse name: Not on file   Number of children: Not on file   Years of education: Not on file   Highest education level: Not on file  Occupational History   Not on file  Tobacco  Use   Smoking status: Former    Pack years: 0.00   Smokeless tobacco: Former  Scientific laboratory technician Use: Never used  Substance and Sexual Activity   Alcohol use: Not Currently   Drug use: Never   Sexual activity: Not on file  Other Topics Concern   Not on file  Social History Narrative   Not on file   Social Determinants of Health   Financial Resource Strain: Not on file  Food Insecurity: Not on file  Transportation  Needs: Not on file  Physical Activity: Not on file  Stress: Not on file  Social Connections: Not on file    Family History  Problem Relation Age of Onset   Cancer Mother    Heart failure Father     Health Maintenance  Topic Date Due   Pneumococcal Vaccine 71-30 Years old (1 - PCV) Never done   HIV Screening  Never done   Hepatitis C Screening  Never done   PAP SMEAR-Modifier  Never done   MAMMOGRAM  Never done   INFLUENZA VACCINE  06/19/2021   COVID-19 Vaccine (5 - Booster) 06/30/2021   TETANUS/TDAP  09/19/2021   COLONOSCOPY (Pts 45-51yrs Insurance coverage will need to be confirmed)  02/18/2031   Zoster Vaccines- Shingrix  Completed   HPV VACCINES  Aged Out     ----------------------------------------------------------------------------------------------------------------------------------------------------------------------------------------------------------------- Physical Exam BP 130/71 (BP Location: Left Arm, Patient Position: Sitting, Cuff Size: Normal)   Pulse 89   Temp 98.5 F (36.9 C) (Oral)   Resp 20   Ht 5\' 7"  (1.702 m)   Wt 139 lb 6.4 oz (63.2 kg)   SpO2 100%   BMI 21.83 kg/m   Physical Exam Constitutional:      Appearance: Normal appearance.  HENT:     Head: Normocephalic and atraumatic.  Eyes:     General: No scleral icterus. Musculoskeletal:     Cervical back: Neck supple.  Skin:    General: Skin is warm and dry.  Neurological:     General: No focal deficit present.     Mental Status: She is alert.    ------------------------------------------------------------------------------------------------------------------------------------------------------------------------------------------------------------------- Assessment and Plan  Hypertension Blood pressure is at goal at for age and co-morbidities.  I recommend continuation of current medications.  In addition they were instructed to follow a low sodium diet with regular exercise to help to  maintain adequate control of blood pressure.    Migraine headache She has noted some changing and increased frequency of migraines.  She would like to follow-up with neurology, referral placed.  Atrophic vaginitis She is doing well with current HRT of combo estradiol and progesterone as well as DHEA.  She does get intermittent yeast infections and I have provided a renewal of Diflucan to use as needed.  ADHD (attention deficit hyperactivity disorder) The symptoms are fairly well controlled with methylphenidate 10 mg 3 times daily.  No significant side effects with this I will plan to continue this for now.  She does not need refills today, she will let me know when she is in need of this.  Chronic constipation Continue Linzess as needed.  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  She will continue this   Meds ordered this encounter  Medications   fluconazole (DIFLUCAN) 150 MG tablet    Sig: Take 1 tablet (150 mg total) by mouth once for 1 dose. May repeat after 72 hours if needed.    Dispense:  2 tablet    Refill:  6    Return in about 6 months (around 11/19/2021) for HTN/ADD.    This visit occurred during the SARS-CoV-2 public health emergency.  Safety protocols were in place, including screening questions prior to the visit, additional usage of staff PPE, and extensive cleaning of exam room while observing appropriate contact time as indicated for disinfecting solutions.

## 2021-05-21 NOTE — Assessment & Plan Note (Signed)
This is been well managed with Pristiq.  She will continue this

## 2021-05-21 NOTE — Assessment & Plan Note (Signed)
She is doing well with current HRT of combo estradiol and progesterone as well as DHEA.  She does get intermittent yeast infections and I have provided a renewal of Diflucan to use as needed.

## 2021-05-21 NOTE — Assessment & Plan Note (Addendum)
The symptoms are fairly well controlled with methylphenidate 10 mg 3 times daily.  No significant side effects with this I will plan to continue this for now.  She does not need refills today, she will let me know when she is in need of this.

## 2021-05-21 NOTE — Assessment & Plan Note (Signed)
Continue Linzess as needed.

## 2021-05-26 DIAGNOSIS — M7501 Adhesive capsulitis of right shoulder: Secondary | ICD-10-CM | POA: Diagnosis not present

## 2021-05-30 ENCOUNTER — Other Ambulatory Visit (HOSPITAL_BASED_OUTPATIENT_CLINIC_OR_DEPARTMENT_OTHER): Payer: Self-pay

## 2021-05-30 MED ORDER — SUMATRIPTAN SUCCINATE 100 MG PO TABS
ORAL_TABLET | ORAL | 1 refills | Status: DC
  Filled 2021-05-30: qty 27, 90d supply, fill #0
  Filled 2021-08-30: qty 18, 60d supply, fill #1
  Filled 2022-02-22: qty 18, 60d supply, fill #2

## 2021-05-30 MED FILL — Sumatriptan Succinate Tab 100 MG: ORAL | 90 days supply | Qty: 24 | Fill #1 | Status: CN

## 2021-06-02 DIAGNOSIS — M7501 Adhesive capsulitis of right shoulder: Secondary | ICD-10-CM | POA: Diagnosis not present

## 2021-06-09 DIAGNOSIS — M7501 Adhesive capsulitis of right shoulder: Secondary | ICD-10-CM | POA: Diagnosis not present

## 2021-06-15 ENCOUNTER — Other Ambulatory Visit: Payer: Self-pay

## 2021-06-15 ENCOUNTER — Other Ambulatory Visit (HOSPITAL_BASED_OUTPATIENT_CLINIC_OR_DEPARTMENT_OTHER): Payer: Self-pay

## 2021-06-16 ENCOUNTER — Other Ambulatory Visit (HOSPITAL_BASED_OUTPATIENT_CLINIC_OR_DEPARTMENT_OTHER): Payer: Self-pay

## 2021-06-16 DIAGNOSIS — M7501 Adhesive capsulitis of right shoulder: Secondary | ICD-10-CM | POA: Diagnosis not present

## 2021-06-16 MED ORDER — METHYLPHENIDATE HCL 10 MG PO TABS
ORAL_TABLET | ORAL | 0 refills | Status: DC
  Filled 2021-06-16: qty 90, 30d supply, fill #0

## 2021-06-23 DIAGNOSIS — M7501 Adhesive capsulitis of right shoulder: Secondary | ICD-10-CM | POA: Diagnosis not present

## 2021-06-30 DIAGNOSIS — M7501 Adhesive capsulitis of right shoulder: Secondary | ICD-10-CM | POA: Diagnosis not present

## 2021-07-01 IMAGING — CT CT SHOULDER*R* W/O CM
1 series · 12 of 14 positions shown, 15 images · non-contrast
Comparison: Right shoulder x-rays dated June 03, 2020.

CLINICAL DATA: Chronic right shoulder pain.

EXAM:
CT OF THE UPPER RIGHT EXTREMITY WITHOUT CONTRAST
TECHNIQUE: Multidetector CT imaging of the right shoulder was performed
according to the standard protocol.

[Series 4: soft tissue · axial · 0.51mm/px · z∈[-205,-45]mm · 12 of 96 slices shown, 15 images]
[im 8/96  soft-tissue]
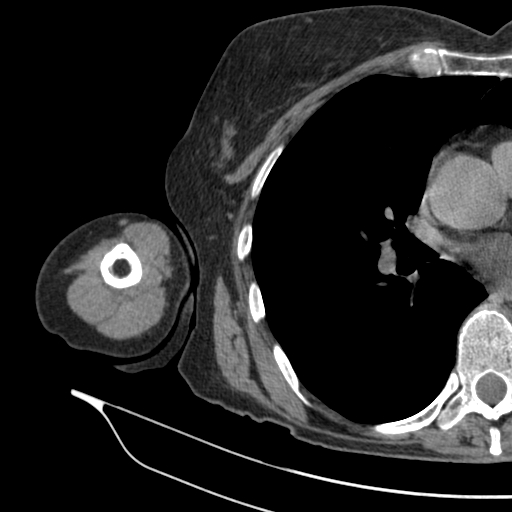
[im 8/96  bone]
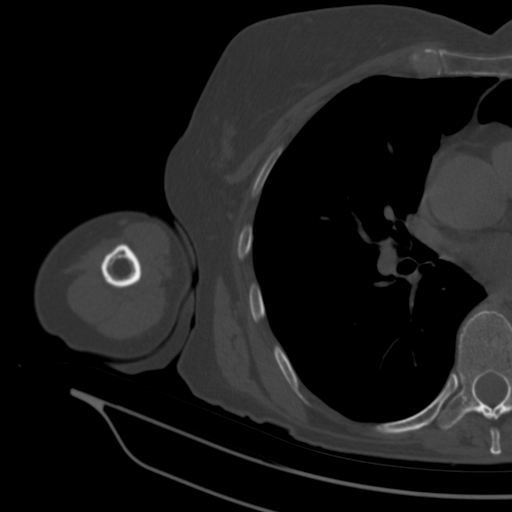
[im 15/96  bone]
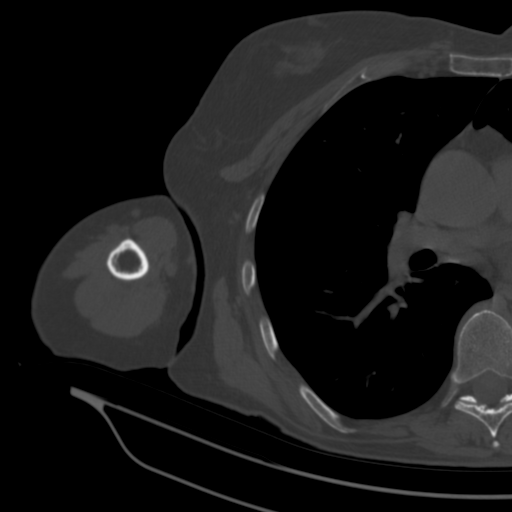
[im 22/96  bone]
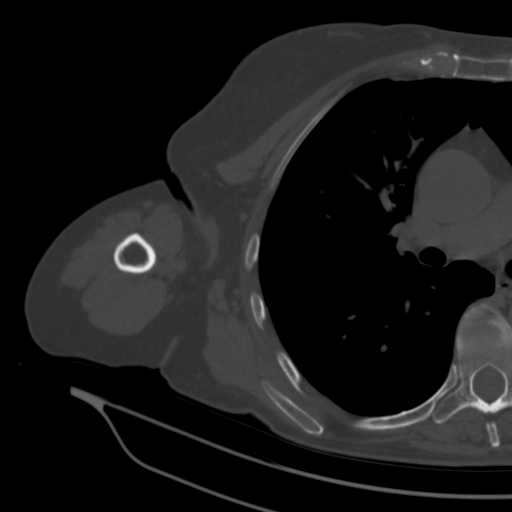
[im 30/96  bone]
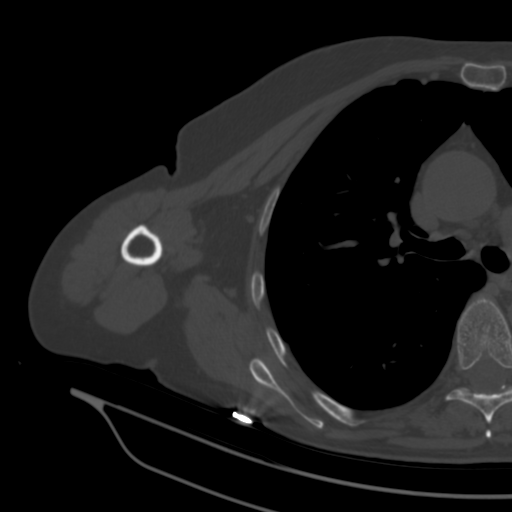
[im 37/96  soft-tissue]
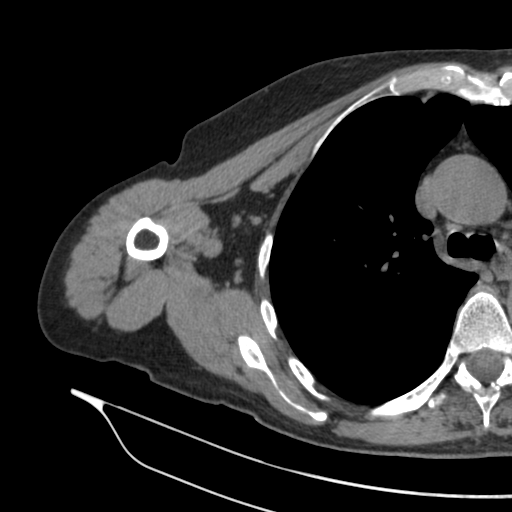
[im 37/96  bone]
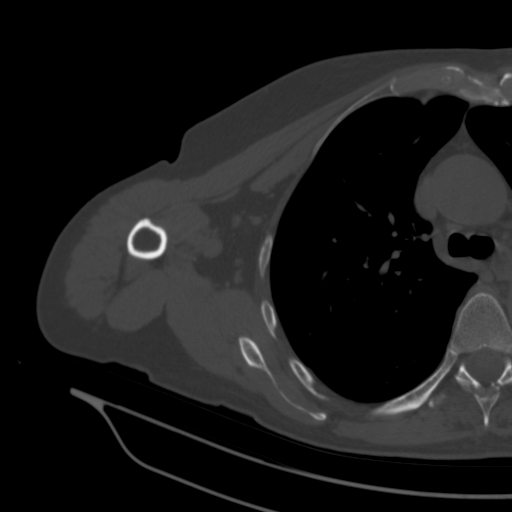
[im 44/96  bone]
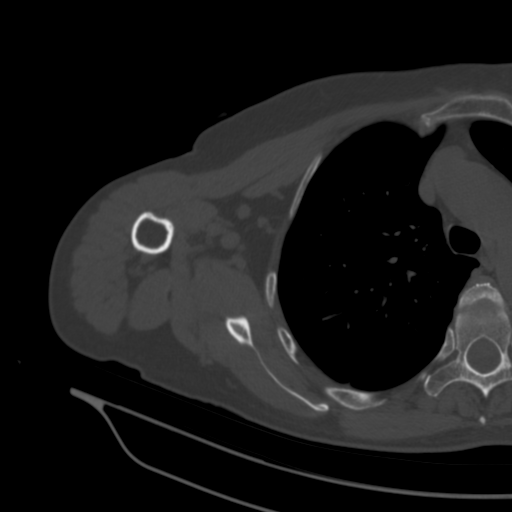
[im 52/96  bone]
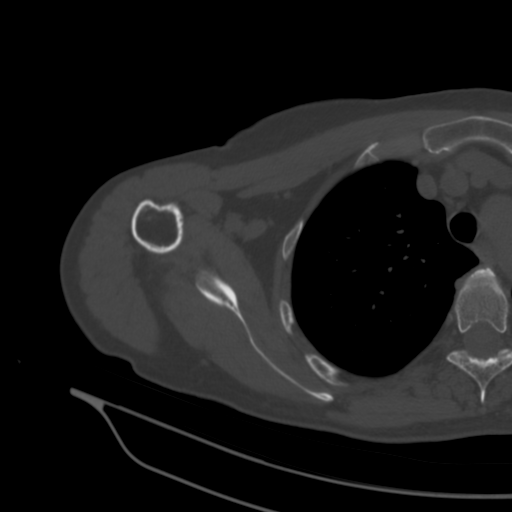
[im 59/96  bone]
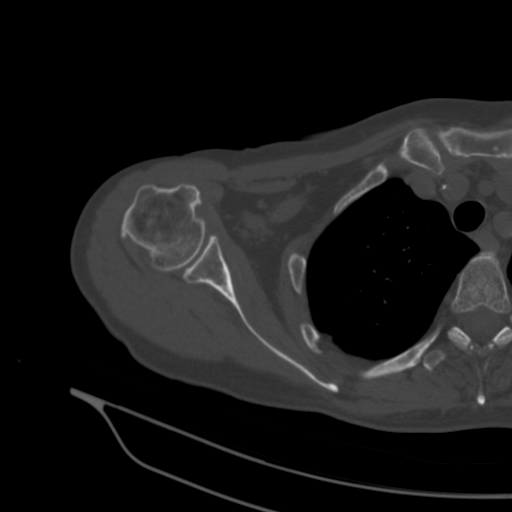
[im 66/96  soft-tissue]
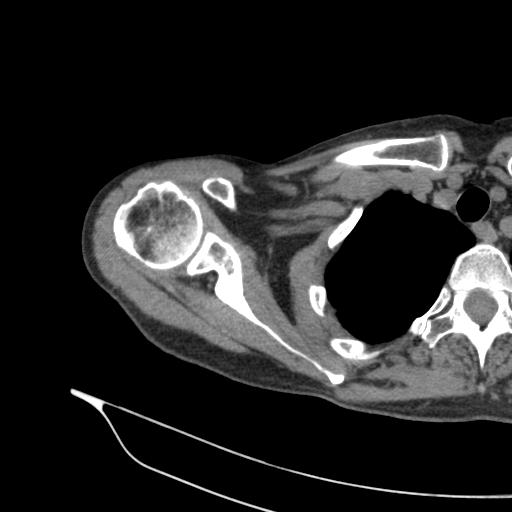
[im 66/96  bone]
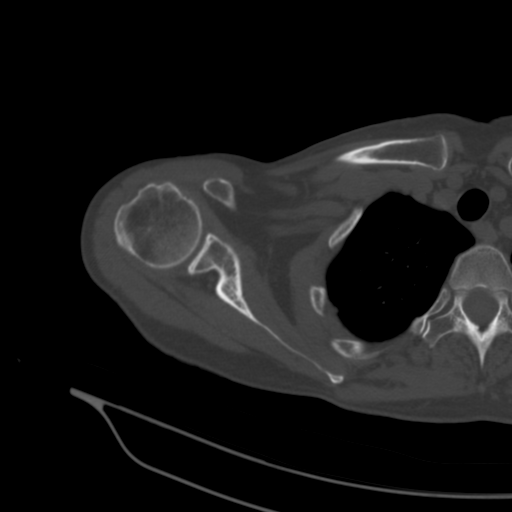
[im 74/96  bone]
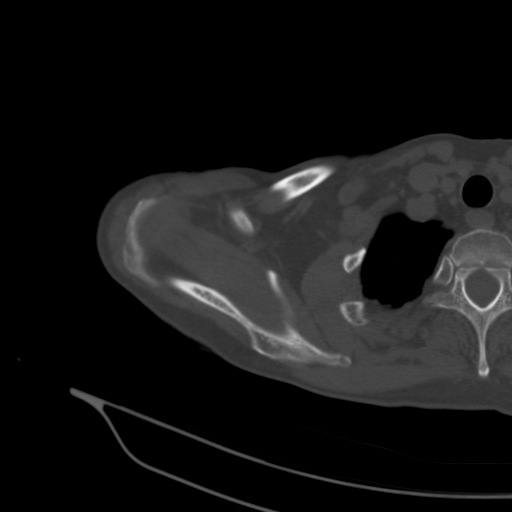
[im 81/96  bone]
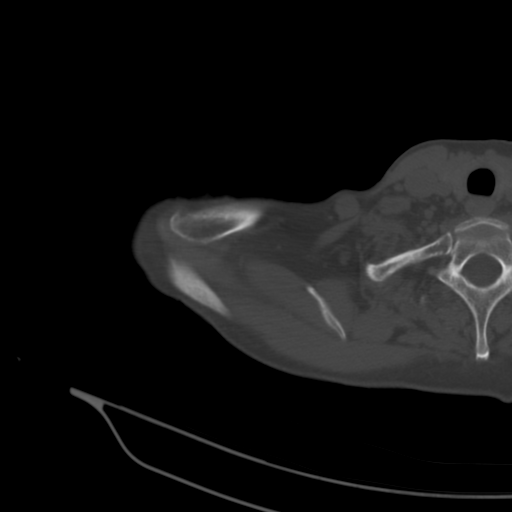
[im 88/96  bone]
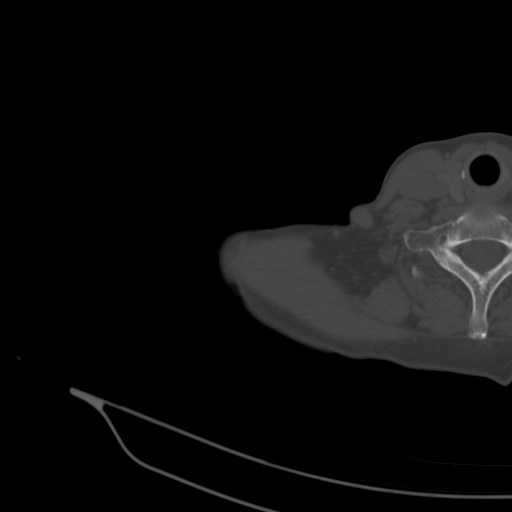

[12 of 14 positions shown; findings below may reference images not displayed]

FINDINGS: Bones/Joint/Cartilage

No acute fracture or dislocation. Healed fracture deformity of the
humeral head/neck with involvement of the lesser and greater
tuberosities. Mild glenohumeral joint space narrowing with small
marginal osteophytes. Preserved acromioclavicular joint space with
tiny marginal osteophytes. No joint effusion.

Ligaments

Ligaments are suboptimally evaluated by CT.

Muscles and Tendons
Grossly intact.  No muscle atrophy.

Soft tissue
No fluid collection or hematoma.  No soft tissue mass.

Right apical pleuroparenchymal scarring.
IMPRESSION: 1. Healed fracture deformity of the humeral head/neck.
2. Mild glenohumeral and acromioclavicular osteoarthritis.

## 2021-07-07 DIAGNOSIS — M7501 Adhesive capsulitis of right shoulder: Secondary | ICD-10-CM | POA: Diagnosis not present

## 2021-07-14 DIAGNOSIS — D1801 Hemangioma of skin and subcutaneous tissue: Secondary | ICD-10-CM | POA: Diagnosis not present

## 2021-07-14 DIAGNOSIS — L821 Other seborrheic keratosis: Secondary | ICD-10-CM | POA: Diagnosis not present

## 2021-07-14 DIAGNOSIS — L905 Scar conditions and fibrosis of skin: Secondary | ICD-10-CM | POA: Diagnosis not present

## 2021-07-14 DIAGNOSIS — Z85820 Personal history of malignant melanoma of skin: Secondary | ICD-10-CM | POA: Diagnosis not present

## 2021-07-14 DIAGNOSIS — M7501 Adhesive capsulitis of right shoulder: Secondary | ICD-10-CM | POA: Diagnosis not present

## 2021-07-14 DIAGNOSIS — D2261 Melanocytic nevi of right upper limb, including shoulder: Secondary | ICD-10-CM | POA: Diagnosis not present

## 2021-07-14 DIAGNOSIS — L814 Other melanin hyperpigmentation: Secondary | ICD-10-CM | POA: Diagnosis not present

## 2021-07-14 DIAGNOSIS — D225 Melanocytic nevi of trunk: Secondary | ICD-10-CM | POA: Diagnosis not present

## 2021-07-17 ENCOUNTER — Other Ambulatory Visit: Payer: Self-pay

## 2021-07-17 ENCOUNTER — Other Ambulatory Visit (HOSPITAL_BASED_OUTPATIENT_CLINIC_OR_DEPARTMENT_OTHER): Payer: Self-pay

## 2021-07-17 MED ORDER — INTRAROSA 6.5 MG VA INST
6.5000 mg | VAGINAL_INSERT | Freq: Every day | VAGINAL | 3 refills | Status: DC
Start: 2021-07-17 — End: 2022-05-25
  Filled 2021-07-17: qty 28, 28d supply, fill #0
  Filled 2021-10-15: qty 28, 28d supply, fill #1
  Filled 2021-12-31: qty 28, 28d supply, fill #2
  Filled 2022-01-24: qty 28, 28d supply, fill #3

## 2021-07-17 MED ORDER — DESVENLAFAXINE SUCCINATE ER 50 MG PO TB24
ORAL_TABLET | Freq: Every day | ORAL | 2 refills | Status: DC
  Filled 2021-07-17: qty 90, 90d supply, fill #0
  Filled 2021-11-18: qty 90, 90d supply, fill #1
  Filled 2022-02-22 – 2022-03-06 (×2): qty 90, 90d supply, fill #2

## 2021-07-17 MED ORDER — METHYLPHENIDATE HCL 10 MG PO TABS
10.0000 mg | ORAL_TABLET | Freq: Three times a day (TID) | ORAL | 0 refills | Status: DC
  Filled 2021-07-17: qty 270, 90d supply, fill #0

## 2021-07-17 NOTE — Progress Notes (Signed)
Prescriptions renewed

## 2021-07-18 ENCOUNTER — Other Ambulatory Visit (HOSPITAL_BASED_OUTPATIENT_CLINIC_OR_DEPARTMENT_OTHER): Payer: Self-pay

## 2021-07-31 ENCOUNTER — Other Ambulatory Visit (HOSPITAL_BASED_OUTPATIENT_CLINIC_OR_DEPARTMENT_OTHER): Payer: Self-pay

## 2021-07-31 ENCOUNTER — Other Ambulatory Visit: Payer: Self-pay

## 2021-07-31 MED ORDER — NORETHINDRONE-ETH ESTRADIOL 1-5 MG-MCG PO TABS
1.0000 | ORAL_TABLET | Freq: Every day | ORAL | 3 refills | Status: DC
  Filled 2021-07-31: qty 84, 84d supply, fill #0
  Filled 2021-11-18: qty 84, 84d supply, fill #1
  Filled 2022-03-17: qty 84, 84d supply, fill #2
  Filled 2022-05-25: qty 84, 84d supply, fill #3

## 2021-08-18 ENCOUNTER — Ambulatory Visit: Payer: 59 | Admitting: Neurology

## 2021-08-18 DIAGNOSIS — L820 Inflamed seborrheic keratosis: Secondary | ICD-10-CM | POA: Diagnosis not present

## 2021-08-25 DIAGNOSIS — Z1231 Encounter for screening mammogram for malignant neoplasm of breast: Secondary | ICD-10-CM | POA: Diagnosis not present

## 2021-08-25 LAB — HM MAMMOGRAPHY

## 2021-08-28 ENCOUNTER — Encounter: Payer: Self-pay | Admitting: Family Medicine

## 2021-08-29 ENCOUNTER — Other Ambulatory Visit (HOSPITAL_BASED_OUTPATIENT_CLINIC_OR_DEPARTMENT_OTHER): Payer: Self-pay

## 2021-08-29 MED ORDER — ONDANSETRON 4 MG PO TBDP
4.0000 mg | ORAL_TABLET | Freq: Three times a day (TID) | ORAL | 6 refills | Status: DC | PRN
  Filled 2021-08-29: qty 20, 7d supply, fill #0
  Filled 2022-01-24: qty 20, 7d supply, fill #1

## 2021-08-30 ENCOUNTER — Other Ambulatory Visit (HOSPITAL_BASED_OUTPATIENT_CLINIC_OR_DEPARTMENT_OTHER): Payer: Self-pay

## 2021-08-31 ENCOUNTER — Encounter: Payer: Self-pay | Admitting: Family Medicine

## 2021-09-01 ENCOUNTER — Encounter: Payer: Self-pay | Admitting: Family Medicine

## 2021-09-12 ENCOUNTER — Encounter: Payer: Self-pay | Admitting: Neurology

## 2021-09-12 ENCOUNTER — Telehealth: Payer: Self-pay | Admitting: *Deleted

## 2021-09-12 ENCOUNTER — Ambulatory Visit: Payer: 59 | Admitting: Neurology

## 2021-09-12 ENCOUNTER — Other Ambulatory Visit: Payer: Self-pay

## 2021-09-12 ENCOUNTER — Other Ambulatory Visit (HOSPITAL_BASED_OUTPATIENT_CLINIC_OR_DEPARTMENT_OTHER): Payer: Self-pay

## 2021-09-12 VITALS — Ht 67.0 in | Wt 135.8 lb

## 2021-09-12 DIAGNOSIS — G43009 Migraine without aura, not intractable, without status migrainosus: Secondary | ICD-10-CM

## 2021-09-12 DIAGNOSIS — G43709 Chronic migraine without aura, not intractable, without status migrainosus: Secondary | ICD-10-CM

## 2021-09-12 MED ORDER — NURTEC 75 MG PO TBDP
75.0000 mg | ORAL_TABLET | Freq: Every day | ORAL | 6 refills | Status: DC | PRN
  Filled 2021-09-12: qty 16, 30d supply, fill #0
  Filled 2022-01-24: qty 16, 30d supply, fill #1
  Filled 2022-03-17: qty 16, 30d supply, fill #2

## 2021-09-12 NOTE — Progress Notes (Signed)
[facility]    Provider:  Dr Jaynee Eagles Requesting Provider: Luetta Nutting, DO Primary Care Provider:  Luetta Nutting, DO  CC:  Migraines  HPI:  Deborah York is a 65 y.o. female here as requested by Luetta Nutting, DO for migraine. PMHx migraines. Had them all her life, she is just tiredof them, she has had a lot of stres recently, she has always had a lot, she takes 9 imitrex a month. Recently she had been under stress, she never misses work, headaches daily, she had one recently it was so terrible she vomited, she vomited 6 times. Usually inilateral, more on the right, behind the eye, pulsating/pounding/throbbing, photophobia/phono/osmophobia, sounds are terrible, nausea, sometimes, loud sounds can trigger, barometric center is a trigger. No aura. They can last 4 hours to 24 hours maybe 36 hours. Imitrex helps. She can also have cyclic and keep rebounding. 15-20 migraine days a month that are moderate to severe. No other focal neurologic deficits, associated symptoms, inciting events or modifiable factors.  Reviewed notes, labs and imaging from outside physicians, which showed:  I reviewed Dr. 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  Had Raysal recently recovered well.  Some mild dysthymia which could control well with Pristiq.  She is on HRT for postmenopausal symptoms.  She has noted increased frequency of migraines, she is taking Imitrex as needed for management of this, she requested to be seen in neurology.No symptoms of sleep panea, mo significant morning headaches, these have been going on for years at same frequency and severity and quality, no vision changes, not positional or exertional. No medication overuse. No medication overuse. No other focal neurologic deficits, associated symptoms, inciting events or modifiable  factors.  From a thorough review of records, medications tried that can be used in migraine management include: Amlodipine(calcium channel bloker like verapamil), Pristiq, Benadryl, Voltaren tablets, gabapentin, Zofran, oxycodone, Imitrex, topamax, propranolol, amitrip/nortriptyline, verapamail, effexor, relpax, maxalt   Review of Systems: Patient complains of symptoms per HPI as well as the following symptoms headache. Pertinent negatives and positives per HPI. All others negative.   Social History   Socioeconomic History   Marital status: Married    Spouse name: Not on file   Number of children: Not on file   Years of education: Not on file   Highest education level: Not on file  Occupational History   Not on file  Tobacco Use   Smoking status: Former   Smokeless tobacco: Former  Scientific laboratory technician Use: Never used  Substance and Sexual Activity   Alcohol use: Not Currently   Drug use: Never   Sexual activity: Not on file  Other Topics Concern   Not on file  Social History Narrative   Caffeine- one cup daily, diet coke daily.  Education: Family MD (healthy weight wellness).      Social Determinants of Health   Financial Resource Strain: Not on file  Food Insecurity: Not on file  Transportation Needs: Not on file  Physical Activity: Not on file  Stress: Not on file  Social Connections: Not on file  Intimate Partner Violence: Not on file    Family History  Problem Relation Age of Onset   Cancer Mother    Migraines Mother    Hypertension Mother    Heart failure Father    Migraines Maternal Grandmother    Migraines Other  Past Medical History:  Diagnosis Date   Complication of anesthesia    Hypertension    Migraines    PONV (postoperative nausea and vomiting)     Patient Active Problem List   Diagnosis Date Noted   Chronic constipation 05/21/2021   Dysthymic disorder 05/21/2021   Right hand pain 03/25/2020   Fracture of humeral head, right, closed  03/04/2020   ADHD (attention deficit hyperactivity disorder) 01/29/2020   Hypertension 01/29/2020   Atrophic vaginitis 06/21/2017   Migraine headache 06/13/2016   Age-related facial wrinkles 12/04/2012   Other specified hypertrophic and atrophic condition of skin 12/04/2012   Ptosis, 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   Rosacea 12/04/2012   Visual field defect 12/04/2012    Past Surgical History:  Procedure Laterality Date   IRRIGATION AND DEBRIDEMENT SHOULDER Right 03/23/2021   Procedure: IRRIGATION AND DEBRIDEMENT SHOULDER;  Surgeon: [name], MD;  Location: [place];  Service: Orthopedics;  Laterality: Right;   LYSIS OF ADHESION Right 03/23/2021   Procedure: LYSIS OF ADHESION WITH MANIPULATION;  Surgeon: [name], MD;  Location: [place];  Service: Orthopedics;  Laterality: Right;   SHOULDER ACROMIOPLASTY Right 03/23/2021   Procedure: SHOULDER ACROMIOPLASTY;  Surgeon: [name], MD;  Location: [place];  Service: Orthopedics;  Laterality: Right;   SHOULDER ARTHROSCOPY WITH DISTAL CLAVICLE RESECTION Right 03/23/2021   Procedure: SHOULDER ARTHROSCOPY WITH DISTAL CLAVICLE RESECTION;  Surgeon: [name], MD;  Location: [place];  Service: Orthopedics;  Laterality: Right;   TOE SURGERY      Current Outpatient Medications  Medication Sig Dispense Refill   Calcium Carbonate-Vitamin D 600-400 MG-UNIT tablet Take 1 tablet by mouth 2 (two) times daily. 60 tablet 11   cholecalciferol (VITAMIN D) 25 MCG (1000 UT) tablet Take by mouth.     desvenlafaxine (PRISTIQ) 50 MG 24 hr tablet TAKE 1 TABLET BY MOUTH ONCE DAILY 90 tablet 2   estradiol (ESTRACE) 0.1 MG/GM vaginal cream Apply intravaginally qhs x 1 week then 2-3 nights weekly     gabapentin (NEURONTIN) 600 MG tablet TAKE 1 TABLET BY MOUTH NIGHTLY AS NEEDED 90 tablet 1   linaclotide (LINZESS) 290 MCG CAPS capsule TAKE ONE CAPSULE (290 MCG DOSE) BY MOUTH DAILY. 90 capsule 3    methylphenidate (RITALIN) 10 MG tablet Take 1 tablet (10 mg total) by mouth 3 (three) times daily with meals. 270 tablet 0   Multiple Vitamins-Minerals (MULTIVITAMIN ADULT) TABS Take by mouth.     norethindrone-ethinyl estradiol (FEMHRT 1/5) 1-5 MG-MCG TABS tablet Take 1 tablet by mouth daily. 84 tablet 3   ondansetron (ZOFRAN ODT) 4 MG disintegrating tablet Take 1 tablet (4 mg total) by mouth every 8 (eight) hours as needed for nausea or vomiting. 20 tablet 6   Pilocarpine HCl (VUITY) 1.25 % SOLN Instill 1 drop into both eyes once a day 5 mL 11   Prasterone (INTRAROSA) 6.5 MG INST Place 6.5 mg vaginally daily. 28 each 3   Rimegepant Sulfate (NURTEC) 75 MG TBDP Take 75 mg by mouth daily as needed. For migraines. Take as close to onset of migraine as possible. One daily maximum. 16 tablet 6   rosuvastatin (CRESTOR) 10 MG tablet Take 1 tablet (10 mg total) by mouth daily. 90 tablet 3   spironolactone (ALDACTONE) 100 MG tablet TAKE 1 TABLET BY MOUTH ONCE DAILY 90 tablet 2   SUMAtriptan (IMITREX) 100 MG tablet TAKE ONE TABLET BY MOUTH TWICE DAILY AS NEEDED FOR MIGRAINE, MAX  2 TABLETS PER DAY 24 tablet 1   telmisartan (MICARDIS) 20 MG tablet Take 1 tablet by mouth daily 90 tablet 3   tretinoin (RETIN-A) 0.1 % cream APPLY 1 DIME-SIZED AMOUNT TO THE ENTIRE FACE NIGHTLY AS TOLERATED 45 g 3   No current facility-administered medications for this visit.    Allergies as of 09/12/2021 - Review Complete 09/12/2021  Allergen Reaction Noted   Sulfa antibiotics Rash 12/04/2012    Vitals: Ht 5\' 7"  (1.702 m)   Wt 135 lb 12.8 oz (61.6 kg)   BMI 21.27 kg/m  Last Weight:  Wt Readings from Last 1 Encounters:  09/12/21 135 lb 12.8 oz (61.6 kg)   Last Height:   Ht Readings from Last 1 Encounters:  09/12/21 5\' 7"  (1.702 m)     Physical exam: Exam: Gen: NAD, conversant, well nourised, obese, well groomed                     CV: RRR, no MRG. No Carotid Bruits. No peripheral edema, warm, nontender Eyes:  Conjunctivae clear without exudates or hemorrhage  Neuro: Detailed Neurologic Exam  Speech:    Speech is normal; fluent and spontaneous with normal comprehension.  Cognition:    The patient is oriented to person, place, and time;     recent and remote memory intact;     language fluent;     normal attention, concentration,     fund of knowledge Cranial Nerves:    The pupils are equal, round, and reactive to light. The fundi are normal and spontaneous venous pulsations are present. Visual fields are full to finger confrontation. Extraocular movements are intact. Trigeminal sensation is intact and the muscles of mastication are normal. The face is symmetric. The palate elevates in the midline. Hearing intact. Voice is normal. Shoulder shrug is normal. The tongue has normal motion without fasciculations.   Coordination:    Normal finger to nose and heel to shin. Normal rapid alternating movements.   Gait:    Heel-toe and tandem gait are normal.   Motor Observation:    No asymmetry, no atrophy, and no involuntary movements noted. Tone:    Normal muscle tone.    Posture:    Posture is normal. normal erect    Strength:    Strength is V/V in the upper and lower limbs.      Sensation: intact to LT     Reflex Exam:  DTR's:    Deep tendon reflexes in the upper and lower extremities are normal bilaterally.   Toes:    The toes are downgoing bilaterally.   Clonus:    Clonus is absent.    Assessment/Plan:  Patient with chronic migraines without aura, has failed multiple medications, start botox  Start Botox for migraines Discussed Emgality* if needed in the future Acute: continue imitrex and zofran Acute: Consider Nurtec as well  Discussed: To prevent or relieve headaches, try the following: Cool Compress. Lie down and place a cool compress on your head.  Avoid headache triggers. If certain foods or odors seem to have triggered your migraines in the past, avoid them. A  headache diary might help you identify triggers.  Include physical activity in your daily routine. Try a daily walk or other moderate aerobic exercise.  Manage stress. Find healthy ways to cope with the stressors, such as delegating tasks on your to-do list.  Practice relaxation techniques. Try deep breathing, yoga, massage and visualization.  Eat regularly. Eating regularly scheduled meals and  maintaining a healthy diet might help prevent headaches. Also, drink plenty of fluids.  Follow a regular sleep schedule. Sleep deprivation might contribute to headaches Consider biofeedback. With this mind-body technique, you learn to control certain bodily functions -- such as muscle tension, heart rate and blood pressure -- to prevent headaches or reduce headache pain.    Proceed to emergency room if you experience new or worsening symptoms or symptoms do not resolve, if you have new neurologic symptoms or if headache is severe, or for any concerning symptom.   Provided education and documentation from American headache Society toolbox including articles on: chronic migraine medication overuse headache, chronic migraines, prevention of migraines, behavioral and other nonpharmacologic treatments for headache.   No orders of the defined types were placed in this encounter.  Meds ordered this encounter  Medications   Rimegepant Sulfate (NURTEC) 75 MG TBDP    Sig: Take 75 mg by mouth daily as needed. For migraines. Take as close to onset of migraine as possible. One daily maximum.    Dispense:  16 tablet    Refill:  6     Cc: Luetta Nutting, DO,  Luetta Nutting, DO  Sarina Ill, MD  [facility] [address] [place], [place] [address]  Phone [phone] Fax [phone]  I spent 45 minutes of face-to-face and non-face-to-face time with patient on the  1. Chronic migraine without aura without status migrainosus, not intractable   2. Migraine without aura  and without status migrainosus, not intractable    diagnosis.  This included previsit chart review, lab review, study review, order entry, electronic health record documentation, patient education on the different diagnostic and therapeutic options, counseling and coordination of care, risks and benefits of management, compliance, or risk factor reduction

## 2021-09-12 NOTE — Telephone Encounter (Signed)
Botox charge sheet completed for 200 units, G43.701. pt will need to sign consent at injection appt.

## 2021-09-12 NOTE — Patient Instructions (Signed)
Start Botox for migraines - will call Start Emgality 2 injections first month then 1 injection each further month Acute: continue imitrex and zofran acutely Acute: Nurtec. acutely  Galcanezumab injection What is this medication? GALCANEZUMAB (gal ka NEZ ue mab) is used to prevent migraines and treat cluster headaches. This medicine may be used for other purposes; ask your health care provider or pharmacist if you have questions. COMMON BRAND NAME(S): Emgality What should I tell my care team before I take this medication? They need to know if you have any of these conditions: an unusual or allergic reaction to galcanezumab, other medicines, foods, dyes, or preservatives pregnant or trying to get pregnant breast-feeding How should I use this medication? This medicine is for injection under the skin. You will be taught how to prepare and give this medicine. Use exactly as directed. Take your medicine at regular intervals. Do not take your medicine more often than directed. It is important that you put your used needles and syringes in a special sharps container. Do not put them in a trash can. If you do not have a sharps container, call your pharmacist or healthcare provider to get one. Talk to your pediatrician regarding the use of this medicine in children. Special care may be needed. Overdosage: If you think you have taken too much of this medicine contact a poison control center or emergency room at once. NOTE: This medicine is only for you. Do not share this medicine with others. What if I miss a dose? If you miss a dose, take it as soon as you can. If it is almost time for your next dose, take only that dose. Do not take double or extra doses. What may interact with this medication? Interactions are not expected. This list may not describe all possible interactions. Give your health care provider a list of all the medicines, herbs, non-prescription drugs, or dietary supplements you use.  Also tell them if you smoke, drink alcohol, or use illegal drugs. Some items may interact with your medicine. What should I watch for while using this medication? Tell your doctor or healthcare professional if your symptoms do not start to get better or if they get worse. What side effects may I notice from receiving this medication? Side effects that you should report to your doctor or health care professional as soon as possible: allergic reactions like skin rash, itching or hives, swelling of the face, lips, or tongue Side effects that usually do not require medical attention (report these to your doctor or health care professional if they continue or are bothersome): pain, redness, or irritation at site where injected This list may not describe all possible side effects. Call your doctor for medical advice about side effects. You may report side effects to FDA at 1-800-FDA-1088. Where should I keep my medication? Keep out of the reach of children. You will be instructed on how to store this medicine. Throw away any unused medicine after the expiration date on the label. NOTE: This sheet is a summary. It may not cover all possible information. If you have questions about this medicine, talk to your doctor, pharmacist, or health care provider.  2022 Elsevier/Gold Standard (2018-04-23 12:03:23)   Rimegepant oral dissolving tablet What is this medication? RIMEGEPANT (ri ME je pant) is used to treat migraine headaches with or without aura. An aura is a strange feeling or visual disturbance that warns you of an attack. It is also used to prevent migraine headaches. This medicine may  be used for other purposes; ask your health care provider or pharmacist if you have questions. COMMON BRAND NAME(S): NURTEC ODT What should I tell my care team before I take this medication? They need to know if you have any of these conditions: kidney disease liver disease an unusual or allergic reaction to  rimegepant, other medicines, foods, dyes, or preservatives pregnant or trying to get pregnant breast-feeding How should I use this medication? Take the medicine by mouth. Follow the directions on the prescription label. Leave the tablet in the sealed blister pack until you are ready to take it. With dry hands, open the blister and gently remove the tablet. If the tablet breaks or crumbles, throw it away and take a new tablet out of the blister pack. Place the tablet in the mouth and allow it to dissolve, and then swallow. Do not cut, crush, or chew this medicine. You do not need water to take this medicine. Talk to your pediatrician about the use of this medicine in children. Special care may be needed. Overdosage: If you think you have taken too much of this medicine contact a poison control center or emergency room at once. NOTE: This medicine is only for you. Do not share this medicine with others. What if I miss a dose? This does not apply. This medicine is not for regular use. What may interact with this medication? This medicine may interact with the following medications: certain medicines for fungal infections like fluconazole, itraconazole rifampin This list may not describe all possible interactions. Give your health care provider a list of all the medicines, herbs, non-prescription drugs, or dietary supplements you use. Also tell them if you smoke, drink alcohol, or use illegal drugs. Some items may interact with your medicine. What should I watch for while using this medication? Visit your health care professional for regular checks on your progress. Tell your health care professional if your symptoms do not start to get better or if they get worse. What side effects may I notice from receiving this medication? Side effects that you should report to your doctor or health care professional as soon as possible: allergic reactions like skin rash, itching or hives; swelling of the face,  lips, or tongue Side effects that usually do not require medical attention (report these to your doctor or health care professional if they continue or are bothersome): nausea This list may not describe all possible side effects. Call your doctor for medical advice about side effects. You may report side effects to FDA at 1-800-FDA-1088. Where should I keep my medication? Keep out of the reach of children and pets. Store at room temperature between 20 and 25 degrees C (68 and 77 degrees F). Get rid of any unused medicine after the expiration date. To get rid of medicines that are no longer needed or have expired: Take the medicine to a medicine take-back program. Check with your pharmacy or law enforcement to find a location. If you cannot return the medicine, check the label or package insert to see if the medicine should be thrown out in the garbage or flushed down the toilet. If you are not sure, ask your health care provider. If it is safe to put it in the trash, take the medicine out of the container. Mix the medicine with cat litter, dirt, coffee grounds, or other unwanted substance. Seal the mixture in a bag or container. Put it in the trash. NOTE: This sheet is a summary. It may not cover  all possible information. If you have questions about this medicine, talk to your doctor, pharmacist, or health care provider.  2022 Elsevier/Gold Standard (2020-04-19 17:56:55)

## 2021-09-13 ENCOUNTER — Telehealth: Payer: Self-pay | Admitting: *Deleted

## 2021-09-13 NOTE — Telephone Encounter (Signed)
Completed Nurtec PA on Cover My Meds. Key: [id number]. Approved immediately.   The request has been approved. The authorization is effective for a maximum of 6 fills from 09/13/2021 to 03/13/2022, as long as the member is enrolled in their current health plan. The request was approved with a quantity restriction. This has been approved for a quantity limit of 18 with a day supply limit of 30. A written notification letter will follow with additional details.

## 2021-09-14 ENCOUNTER — Other Ambulatory Visit (HOSPITAL_BASED_OUTPATIENT_CLINIC_OR_DEPARTMENT_OTHER): Payer: Self-pay

## 2021-09-15 ENCOUNTER — Ambulatory Visit: Payer: 59 | Admitting: Family Medicine

## 2021-09-15 ENCOUNTER — Encounter: Payer: Self-pay | Admitting: Family Medicine

## 2021-09-15 ENCOUNTER — Other Ambulatory Visit: Payer: Self-pay

## 2021-09-15 ENCOUNTER — Other Ambulatory Visit (HOSPITAL_BASED_OUTPATIENT_CLINIC_OR_DEPARTMENT_OTHER): Payer: Self-pay

## 2021-09-15 VITALS — BP 138/65 | HR 80 | Temp 97.5°F | Ht 67.0 in | Wt 137.0 lb

## 2021-09-15 DIAGNOSIS — J0140 Acute pansinusitis, unspecified: Secondary | ICD-10-CM

## 2021-09-15 MED ORDER — FLUTICASONE PROPIONATE 50 MCG/ACT NA SUSP: 2.0000 | Freq: Every day | NASAL | 0 refills | Status: DC

## 2021-09-15 MED ORDER — AMOXICILLIN-POT CLAVULANATE 875-125 MG PO TABS: 1.0000 | ORAL_TABLET | Freq: Two times a day (BID) | ORAL | 0 refills | Status: AC

## 2021-09-15 NOTE — Progress Notes (Signed)
Acute Office Visit  Subjective:    Patient ID: Deborah York, female    DOB: 29-Jun-1956, 65 y.o.   MRN: [id number]  Chief Complaint  Patient presents with   Sinus Problem    HPI Patient is in today for possible sinus infection.  She started with cold-like symptoms after her husband had similar symptoms. States she started feeling bad about 12-13 days ago. Current symptoms include 5/10 pansinusitis pressure (worse at ethmoid sinuses), nasal congestion, purulent nasal drainage, headaches, mild vertigo. She denies fevers, cough, chest pain, shortness of breath, GI/GU symptoms. She has had minimal improvement with OTX Sinex and DayQuil.    Past Medical History:  Diagnosis Date   Complication of anesthesia    Hypertension    Migraines    PONV (postoperative nausea and vomiting)     Past Surgical History:  Procedure Laterality Date   IRRIGATION AND DEBRIDEMENT SHOULDER Right 03/23/2021   Procedure: IRRIGATION AND DEBRIDEMENT SHOULDER;  Surgeon: [name], MD;  Location: [place];  Service: Orthopedics;  Laterality: Right;   LYSIS OF ADHESION Right 03/23/2021   Procedure: LYSIS OF ADHESION WITH MANIPULATION;  Surgeon: [name], MD;  Location: [place];  Service: Orthopedics;  Laterality: Right;   SHOULDER ACROMIOPLASTY Right 03/23/2021   Procedure: SHOULDER ACROMIOPLASTY;  Surgeon: [name], MD;  Location: [place];  Service: Orthopedics;  Laterality: Right;   SHOULDER ARTHROSCOPY WITH DISTAL CLAVICLE RESECTION Right 03/23/2021   Procedure: SHOULDER ARTHROSCOPY WITH DISTAL CLAVICLE RESECTION;  Surgeon: [name], MD;  Location: [place];  Service: Orthopedics;  Laterality: Right;   TOE SURGERY      Family History  Problem Relation Age of Onset   Cancer Mother    Migraines Mother    Hypertension Mother    Heart failure Father    Migraines Maternal Grandmother    Migraines Other     Social History    Socioeconomic History   Marital status: Married    Spouse name: Not on file   Number of children: Not on file   Years of education: Not on file   Highest education level: Not on file  Occupational History   Not on file  Tobacco Use   Smoking status: Former   Smokeless tobacco: Former  Scientific laboratory technician Use: Never used  Substance and Sexual Activity   Alcohol use: Not Currently   Drug use: Never   Sexual activity: Not on file  Other Topics Concern   Not on file  Social History Narrative   Caffeine- one cup daily, diet coke daily.  Education: Family MD (healthy weight wellness).      Social Determinants of Health   Financial Resource Strain: Not on file  Food Insecurity: Not on file  Transportation Needs: Not on file  Physical Activity: Not on file  Stress: Not on file  Social Connections: Not on file  Intimate Partner Violence: Not on file    Outpatient Medications Prior to Visit  Medication Sig Dispense Refill   Calcium Carbonate-Vitamin D 600-400 MG-UNIT tablet Take 1 tablet by mouth 2 (two) times daily. 60 tablet 11   desvenlafaxine (PRISTIQ) 50 MG 24 hr tablet TAKE 1 TABLET BY MOUTH ONCE DAILY 90 tablet 2   estradiol (ESTRACE) 0.1 MG/GM vaginal cream Apply intravaginally qhs x 1 week then 2-3 nights weekly     gabapentin (NEURONTIN) 600 MG tablet TAKE 1 TABLET BY MOUTH NIGHTLY AS NEEDED  90 tablet 1   Galcanezumab-gnlm (EMGALITY) 120 MG/ML SOAJ Inject into the skin.     linaclotide (LINZESS) 290 MCG CAPS capsule TAKE ONE CAPSULE (290 MCG DOSE) BY MOUTH DAILY. 90 capsule 3   methylphenidate (RITALIN) 10 MG tablet Take 1 tablet (10 mg total) by mouth 3 (three) times daily with meals. 270 tablet 0   Multiple Vitamins-Minerals (MULTIVITAMIN ADULT) TABS Take by mouth.     norethindrone-ethinyl estradiol (FEMHRT 1/5) 1-5 MG-MCG TABS tablet Take 1 tablet by mouth daily. 84 tablet 3   ondansetron (ZOFRAN ODT) 4 MG disintegrating tablet Take 1 tablet (4 mg total) by mouth  every 8 (eight) hours as needed for nausea or vomiting. 20 tablet 6   Pilocarpine HCl (VUITY) 1.25 % SOLN Instill 1 drop into both eyes once a day 5 mL 11   Prasterone (INTRAROSA) 6.5 MG INST Place 6.5 mg vaginally daily. 28 each 3   Rimegepant Sulfate (NURTEC) 75 MG TBDP Take 75 mg by mouth daily as needed. For migraines. Take as close to onset of migraine as possible. One daily maximum. 16 tablet 6   rosuvastatin (CRESTOR) 10 MG tablet Take 1 tablet (10 mg total) by mouth daily. 90 tablet 3   spironolactone (ALDACTONE) 100 MG tablet TAKE 1 TABLET BY MOUTH ONCE DAILY 90 tablet 2   SUMAtriptan (IMITREX) 100 MG tablet TAKE ONE TABLET BY MOUTH TWICE DAILY AS NEEDED FOR MIGRAINE, MAX 2 TABLETS PER DAY 24 tablet 1   telmisartan (MICARDIS) 20 MG tablet Take 1 tablet by mouth daily 90 tablet 3   tretinoin (RETIN-A) 0.1 % cream APPLY 1 DIME-SIZED AMOUNT TO THE ENTIRE FACE NIGHTLY AS TOLERATED 45 g 3   cholecalciferol (VITAMIN D) 25 MCG (1000 UT) tablet Take by mouth.     No facility-administered medications prior to visit.    Allergies  Allergen Reactions   Sulfa Antibiotics Rash    Review of Systems All review of systems negative except what is listed in the HPI     Objective:    Physical Exam Vitals reviewed.  Constitutional:      Appearance: Normal appearance.  HENT:     Head: Normocephalic and atraumatic.     Comments: Pansinusitis tenderness on palpation.    Right Ear: Tympanic membrane normal.     Left Ear: Tympanic membrane normal.     Nose: Congestion present.     Mouth/Throat:     Mouth: Mucous membranes are moist.     Pharynx: Oropharynx is clear. No oropharyngeal exudate or posterior oropharyngeal erythema.  Eyes:     Extraocular Movements: Extraocular movements intact.     Conjunctiva/sclera: Conjunctivae normal.     Pupils: Pupils are equal, round, and reactive to light.  Pulmonary:     Effort: Pulmonary effort is normal.  Musculoskeletal:        General: Normal  range of motion.     Cervical back: Normal range of motion and neck supple. No tenderness.  Lymphadenopathy:     Cervical: No cervical adenopathy.  Skin:    General: Skin is warm and dry.  Neurological:     Mental Status: She is alert and oriented to person, place, and time.  Psychiatric:        Mood and Affect: Mood normal.        Behavior: Behavior normal.        Thought Content: Thought content normal.        Judgment: Judgment normal.    BP 138/65  Pulse 80   Temp (!) 97.5 F (36.4 C) (Oral)   Ht 5\' 7"  (1.702 m)   Wt 137 lb (62.1 kg)   SpO2 100%   BMI 21.46 kg/m  Wt Readings from Last 3 Encounters:  09/15/21 137 lb (62.1 kg)  09/12/21 135 lb 12.8 oz (61.6 kg)  05/19/21 139 lb 6.4 oz (63.2 kg)    Health Maintenance Due  Topic Date Due   Pneumonia Vaccine 63+ Years old (1 - PCV) Never done   HIV Screening  Never done   Hepatitis C Screening  Never done   PAP SMEAR-Modifier  Never done   COVID-19 Vaccine (4 - Booster) 04/25/2021    There are no preventive care reminders to display for this patient.   No results found for: TSH No results found for: WBC, HGB, HCT, MCV, PLT Lab Results  Component Value Date   NA 135 03/21/2021   K 4.0 03/21/2021   CO2 26 03/21/2021   GLUCOSE 134 (H) 03/21/2021   BUN 16 03/21/2021   CREATININE 0.63 03/21/2021   CALCIUM 9.2 03/21/2021   ANIONGAP 8 03/21/2021   No results found for: CHOL No results found for: HDL No results found for: LDLCALC No results found for: TRIG No results found for: CHOLHDL No results found for: HGBA1C     Assessment & Plan:   1. Acute non-recurrent pansinusitis Given duration, starting ABX. Patient encouraged to start using regular Flonase and Mucinex. Continue supportive measures including rest, hydration, humidifier use, warm compresses, OTC cough/cold/analgesics. Patient aware of signs/symptoms requiring further/urgent evaluation.  - amoxicillin-clavulanate (AUGMENTIN) 875-125 MG tablet;  Take 1 tablet by mouth 2 (two) times daily for 10 days.  Dispense: 20 tablet; Refill: 0 - fluticasone (FLONASE) 50 MCG/ACT nasal spray; Place 2 sprays into both nostrils daily.  Dispense: 1 g; Refill: 0  Follow-up as needed.   Purcell Nails Olevia Bowens, DNP, FNP-C

## 2021-09-18 NOTE — Telephone Encounter (Signed)
I called [facility] @ [phone] and spoke with Belenda Cruise to start Botox PA. CPT [id number], [id number]. Dx: Z00.923. Requesting 155 units every 12 weeks. Belenda Cruise advised me to fax clinicals to [phone]. Pending #[id number].

## 2021-09-25 NOTE — Telephone Encounter (Signed)
Received approval from [facility]. [place] [phone] (09/18/21- 03/18/22). I called patient. She states she just had cosmetic Botox and would like to wait 3 months before injections for her migraines. We scheduled her appointment for 01/02/22 at 7:30.

## 2021-09-29 DIAGNOSIS — Z85820 Personal history of malignant melanoma of skin: Secondary | ICD-10-CM | POA: Diagnosis not present

## 2021-09-29 DIAGNOSIS — L821 Other seborrheic keratosis: Secondary | ICD-10-CM | POA: Diagnosis not present

## 2021-09-29 DIAGNOSIS — D1801 Hemangioma of skin and subcutaneous tissue: Secondary | ICD-10-CM | POA: Diagnosis not present

## 2021-09-29 DIAGNOSIS — Z85828 Personal history of other malignant neoplasm of skin: Secondary | ICD-10-CM | POA: Diagnosis not present

## 2021-09-29 DIAGNOSIS — L905 Scar conditions and fibrosis of skin: Secondary | ICD-10-CM | POA: Diagnosis not present

## 2021-10-15 ENCOUNTER — Other Ambulatory Visit (HOSPITAL_BASED_OUTPATIENT_CLINIC_OR_DEPARTMENT_OTHER): Payer: Self-pay

## 2021-10-16 ENCOUNTER — Other Ambulatory Visit (HOSPITAL_BASED_OUTPATIENT_CLINIC_OR_DEPARTMENT_OTHER): Payer: Self-pay

## 2021-10-16 MED ORDER — TELMISARTAN 20 MG PO TABS
20.0000 mg | ORAL_TABLET | Freq: Every day | ORAL | 3 refills | Status: DC
  Filled 2021-10-16: qty 90, 90d supply, fill #0
  Filled 2022-01-24: qty 90, 90d supply, fill #1
  Filled 2022-08-29: qty 90, 90d supply, fill #2

## 2021-10-17 ENCOUNTER — Other Ambulatory Visit (HOSPITAL_BASED_OUTPATIENT_CLINIC_OR_DEPARTMENT_OTHER): Payer: Self-pay

## 2021-10-17 ENCOUNTER — Other Ambulatory Visit: Payer: Self-pay | Admitting: Family Medicine

## 2021-10-17 DIAGNOSIS — F900 Attention-deficit hyperactivity disorder, predominantly inattentive type: Secondary | ICD-10-CM

## 2021-10-18 ENCOUNTER — Other Ambulatory Visit (HOSPITAL_BASED_OUTPATIENT_CLINIC_OR_DEPARTMENT_OTHER): Payer: Self-pay

## 2021-10-18 MED ORDER — METHYLPHENIDATE HCL 10 MG PO TABS
10.0000 mg | ORAL_TABLET | Freq: Three times a day (TID) | ORAL | 0 refills | Status: DC
Start: 2021-10-18 — End: 2021-12-10
  Filled 2021-10-18: qty 135, 45d supply, fill #0

## 2021-10-25 ENCOUNTER — Other Ambulatory Visit (HOSPITAL_BASED_OUTPATIENT_CLINIC_OR_DEPARTMENT_OTHER): Payer: Self-pay

## 2021-11-03 ENCOUNTER — Other Ambulatory Visit (HOSPITAL_BASED_OUTPATIENT_CLINIC_OR_DEPARTMENT_OTHER): Payer: Self-pay

## 2021-11-03 DIAGNOSIS — K581 Irritable bowel syndrome with constipation: Secondary | ICD-10-CM | POA: Diagnosis not present

## 2021-11-03 MED ORDER — LINZESS 290 MCG PO CAPS
ORAL_CAPSULE | ORAL | 3 refills | Status: DC
  Filled 2021-11-03 – 2021-11-19 (×2): qty 90, 90d supply, fill #0
  Filled 2022-03-17: qty 90, 90d supply, fill #1
  Filled 2022-06-14: qty 90, 90d supply, fill #2
  Filled 2022-10-14 – 2022-10-19 (×2): qty 90, 90d supply, fill #3

## 2021-11-10 ENCOUNTER — Other Ambulatory Visit (HOSPITAL_BASED_OUTPATIENT_CLINIC_OR_DEPARTMENT_OTHER): Payer: Self-pay

## 2021-11-20 ENCOUNTER — Other Ambulatory Visit (HOSPITAL_BASED_OUTPATIENT_CLINIC_OR_DEPARTMENT_OTHER): Payer: Self-pay

## 2021-11-24 ENCOUNTER — Other Ambulatory Visit: Payer: Self-pay

## 2021-11-24 ENCOUNTER — Ambulatory Visit (INDEPENDENT_AMBULATORY_CARE_PROVIDER_SITE_OTHER): Payer: 59 | Admitting: Family Medicine

## 2021-11-24 ENCOUNTER — Encounter: Payer: Self-pay | Admitting: Family Medicine

## 2021-11-24 VITALS — BP 123/76 | HR 92 | Ht 67.0 in | Wt 138.0 lb

## 2021-11-24 DIAGNOSIS — Z124 Encounter for screening for malignant neoplasm of cervix: Secondary | ICD-10-CM

## 2021-11-24 DIAGNOSIS — R946 Abnormal results of thyroid function studies: Secondary | ICD-10-CM

## 2021-11-24 DIAGNOSIS — I10 Essential (primary) hypertension: Secondary | ICD-10-CM

## 2021-11-24 DIAGNOSIS — Z0000 Encounter for general adult medical examination without abnormal findings: Secondary | ICD-10-CM

## 2021-11-24 DIAGNOSIS — Z13220 Encounter for screening for lipoid disorders: Secondary | ICD-10-CM

## 2021-11-26 ENCOUNTER — Encounter: Payer: Self-pay | Admitting: Family Medicine

## 2021-11-26 DIAGNOSIS — Z0000 Encounter for general adult medical examination without abnormal findings: Secondary | ICD-10-CM | POA: Insufficient documentation

## 2021-11-26 NOTE — Assessment & Plan Note (Signed)
Well adult Chronic conditions well controlled Orders Placed This Encounter  Procedures   COMPLETE METABOLIC PANEL WITH GFR   CBC with Differential   Lipid Panel w/reflex Direct LDL   Vitamin D (25 hydroxy)   TSH   T3, free   T4, free   T3, reverse   Fe+TIBC+Fer   Ambulatory referral to Obstetrics / Gynecology    Referral Priority:   Routine    Referral Type:   Consultation    Referral Reason:   Specialty Services Required    Requested Specialty:   Obstetrics and Gynecology    Number of Visits Requested:   1  Screenings: Per lab orders.  Referral placed to GYN as well. Immunizations: Up-to-date Anticipatory guidance/risk factor reduction: Recommendations per AVS.

## 2021-11-26 NOTE — Progress Notes (Signed)
Deborah York - 66 y.o. female MRN [id number]  Date of birth: 05-30-1956  Subjective Chief Complaint  Patient presents with   Follow-up    HPI Deborah York is a 66 year old female here today for annual exam.  Reports she is doing well.  She would like to have updated labs today.  She denies any new complaints or concerns at this time.  She would like a referral to GYN for continued female exams and cervical cancer screening.  She is up-to-date on mammogram, colonoscopy, DEXA, shingles vaccines, flu and pneumonia vaccines.  She has stay fairly active.  She feels like her diet is healthy.  Review of Systems  Constitutional:  Negative for chills, fever, malaise/fatigue and weight loss.  HENT:  Negative for congestion, ear pain and sore throat.   Eyes:  Negative for blurred vision, double vision and pain.  Respiratory:  Negative for cough and shortness of breath.   Cardiovascular:  Negative for chest pain and palpitations.  Gastrointestinal:  Negative for abdominal pain, blood in stool, constipation, heartburn and nausea.  Genitourinary:  Negative for dysuria and urgency.  Musculoskeletal:  Negative for joint pain and myalgias.  Neurological:  Negative for dizziness and headaches.  Endo/Heme/Allergies:  Does not bruise/bleed easily.  Psychiatric/Behavioral:  Negative for depression. The patient is not nervous/anxious and does not have insomnia.    Allergies  Allergen Reactions   Sulfa Antibiotics Rash    Past Medical History:  Diagnosis Date   Complication of anesthesia    Hypertension    Migraines    PONV (postoperative nausea and vomiting)     Past Surgical History:  Procedure Laterality Date   IRRIGATION AND DEBRIDEMENT SHOULDER Right 03/23/2021   Procedure: IRRIGATION AND DEBRIDEMENT SHOULDER;  Surgeon: [name], MD;  Location: [place];  Service: Orthopedics;  Laterality: Right;   LYSIS OF ADHESION Right 03/23/2021   Procedure: LYSIS OF ADHESION  WITH MANIPULATION;  Surgeon: [name], MD;  Location: [place];  Service: Orthopedics;  Laterality: Right;   SHOULDER ACROMIOPLASTY Right 03/23/2021   Procedure: SHOULDER ACROMIOPLASTY;  Surgeon: [name], MD;  Location: [place];  Service: Orthopedics;  Laterality: Right;   SHOULDER ARTHROSCOPY WITH DISTAL CLAVICLE RESECTION Right 03/23/2021   Procedure: SHOULDER ARTHROSCOPY WITH DISTAL CLAVICLE RESECTION;  Surgeon: [name], MD;  Location: [place];  Service: Orthopedics;  Laterality: Right;   TOE SURGERY      Social History   Socioeconomic History   Marital status: Married    Spouse name: Not on file   Number of children: Not on file   Years of education: Not on file   Highest education level: Not on file  Occupational History   Not on file  Tobacco Use   Smoking status: Former   Smokeless tobacco: Former  Scientific laboratory technician Use: Never used  Substance and Sexual Activity   Alcohol use: Not Currently   Drug use: Never   Sexual activity: Not on file  Other Topics Concern   Not on file  Social History Narrative   Caffeine- one cup daily, diet coke daily.  Education: Family MD (healthy weight wellness).      Social Determinants of Health   Financial Resource Strain: Not on file  Food Insecurity: Not on file  Transportation Needs: Not on file  Physical Activity: Not on file  Stress: Not on file  Social Connections: Not on file  Family History  Problem Relation Age of Onset   Cancer Mother    Migraines Mother    Hypertension Mother    Heart failure Father    Migraines Maternal Grandmother    Migraines Other     Health Maintenance  Topic Date Due   HIV Screening  Never done   Hepatitis C Screening  Never done   PAP SMEAR-Modifier  Never done   TETANUS/TDAP  09/19/2021   COVID-19 Vaccine (5 - Booster) 10/27/2021   Pneumonia Vaccine 34+ Years old (1 - PCV) 11/24/2022 (Originally 06/11/1962)    MAMMOGRAM  08/26/2023   COLONOSCOPY (Pts 45-62yrs Insurance coverage will need to be confirmed)  02/18/2031   INFLUENZA VACCINE  Completed   DEXA SCAN  Completed   Zoster Vaccines- Shingrix  Completed   HPV VACCINES  Aged Out     ----------------------------------------------------------------------------------------------------------------------------------------------------------------------------------------------------------------- Physical Exam BP 123/76 (BP Location: Right Arm, Patient Position: Sitting, Cuff Size: Small)    Pulse 92    Ht 5\' 7"  (1.702 m)    Wt 138 lb (62.6 kg)    SpO2 94%    BMI 21.61 kg/m   Physical Exam Constitutional:      General: She is not in acute distress. HENT:     Head: Normocephalic and atraumatic.     Right Ear: Tympanic membrane and ear canal normal.     Left Ear: Tympanic membrane and ear canal normal.     Nose: Nose normal.  Eyes:     General: No scleral icterus.    Conjunctiva/sclera: Conjunctivae normal.  Neck:     Thyroid: No thyromegaly.  Cardiovascular:     Rate and Rhythm: Normal rate and regular rhythm.     Heart sounds: Normal heart sounds.  Pulmonary:     Effort: Pulmonary effort is normal.     Breath sounds: Normal breath sounds.  Abdominal:     General: Bowel sounds are normal. There is no distension.     Palpations: Abdomen is soft.     Tenderness: There is no abdominal tenderness. There is no guarding.  Musculoskeletal:        General: Normal range of motion.     Cervical back: Normal range of motion and neck supple.  Lymphadenopathy:     Cervical: No cervical adenopathy.  Skin:    General: Skin is warm and dry.     Findings: No rash.  Neurological:     General: No focal deficit present.     Mental Status: She is alert and oriented to person, place, and time.     Cranial Nerves: No cranial nerve deficit.     Coordination: Coordination normal.  Psychiatric:        Mood and Affect: Mood normal.        Behavior:  Behavior normal.    ------------------------------------------------------------------------------------------------------------------------------------------------------------------------------------------------------------------- Assessment and Plan  Well adult exam Well adult Chronic conditions well controlled Orders Placed This Encounter  Procedures   COMPLETE METABOLIC PANEL WITH GFR   CBC with Differential   Lipid Panel w/reflex Direct LDL   Vitamin D (25 hydroxy)   TSH   T3, free   T4, free   T3, reverse   Fe+TIBC+Fer   Ambulatory referral to Obstetrics / Gynecology    Referral Priority:   Routine    Referral Type:   Consultation    Referral Reason:   Specialty Services Required    Requested Specialty:   Obstetrics and Gynecology    Number of Visits Requested:   1  Screenings: Per lab orders.  Referral placed to GYN as well. Immunizations: Up-to-date Anticipatory guidance/risk factor reduction: Recommendations per AVS.   No orders of the defined types were placed in this encounter.   No follow-ups on file.    This visit occurred during the SARS-CoV-2 public health emergency.  Safety protocols were in place, including screening questions prior to the visit, additional usage of staff PPE, and extensive cleaning of exam room while observing appropriate contact time as indicated for disinfecting solutions.

## 2021-11-28 LAB — COMPLETE METABOLIC PANEL WITH GFR
AG Ratio: 1.8 (calc) (ref 1.0–2.5)
ALT: 13 U/L (ref 6–29)
AST: 17 U/L (ref 10–35)
Albumin: 4.5 g/dL (ref 3.6–5.1)
Alkaline phosphatase (APISO): 67 U/L (ref 37–153)
BUN: 17 mg/dL (ref 7–25)
CO2: 27 mmol/L (ref 20–32)
Calcium: 9.7 mg/dL (ref 8.6–10.4)
Chloride: 104 mmol/L (ref 98–110)
Creat: 0.71 mg/dL (ref 0.50–1.05)
Globulin: 2.5 g/dL (calc) (ref 1.9–3.7)
Glucose, Bld: 80 mg/dL (ref 65–99)
Potassium: 4.3 mmol/L (ref 3.5–5.3)
Sodium: 140 mmol/L (ref 135–146)
Total Bilirubin: 0.7 mg/dL (ref 0.2–1.2)
Total Protein: 7 g/dL (ref 6.1–8.1)
eGFR: 94 mL/min/{1.73_m2} (ref >=60)

## 2021-11-28 LAB — CBC WITH DIFFERENTIAL/PLATELET
Absolute Monocytes: 698 cells/uL (ref 200–950)
Basophils Absolute: 58 cells/uL (ref 0–200)
Basophils Relative: 0.8 %
Eosinophils Absolute: 43 cells/uL (ref 15–500)
Eosinophils Relative: 0.6 %
HCT: 43.8 % (ref 35.0–45.0)
Hemoglobin: 14.4 g/dL (ref 11.7–15.5)
Lymphs Abs: 1865 cells/uL (ref 850–3900)
MCH: 30.2 pg (ref 27.0–33.0)
MCHC: 32.9 g/dL (ref 32.0–36.0)
MCV: 91.8 fL (ref 80.0–100.0)
MPV: 10.3 fL (ref 7.5–12.5)
Monocytes Relative: 9.7 %
Neutro Abs: 4536 cells/uL (ref 1500–7800)
Neutrophils Relative %: 63 %
Platelets: 428 10*3/uL — ABNORMAL HIGH (ref 140–400)
RBC: 4.77 10*6/uL (ref 3.80–5.10)
RDW: 12.8 % (ref 11.0–15.0)
Total Lymphocyte: 25.9 %
WBC: 7.2 10*3/uL (ref 3.8–10.8)

## 2021-11-28 LAB — LIPID PANEL W/REFLEX DIRECT LDL
Cholesterol: 154 mg/dL (ref <200)
HDL: 69 mg/dL (ref >=50)
LDL Cholesterol (Calc): 69 mg/dL (calc)
Non-HDL Cholesterol (Calc): 85 mg/dL (calc) (ref <130)
Total CHOL/HDL Ratio: 2.2 (calc) (ref <5.0)
Triglycerides: 81 mg/dL (ref <150)

## 2021-11-28 LAB — IRON,TIBC AND FERRITIN PANEL
%SAT: 42 % (calc) (ref 16–45)
Ferritin: 15 ng/mL — ABNORMAL LOW (ref 16–288)
Iron: 186 ug/dL — ABNORMAL HIGH (ref 45–160)
TIBC: 447 mcg/dL (calc) (ref 250–450)

## 2021-11-28 LAB — T3, FREE: T3, Free: 2.8 pg/mL (ref 2.3–4.2)

## 2021-11-28 LAB — TSH: TSH: 2.23 mIU/L (ref 0.40–4.50)

## 2021-11-28 LAB — T4, FREE: Free T4: 1.1 ng/dL (ref 0.8–1.8)

## 2021-11-28 LAB — VITAMIN D 25 HYDROXY (VIT D DEFICIENCY, FRACTURES): Vit D, 25-Hydroxy: 45 ng/mL (ref 30–100)

## 2021-11-28 LAB — T3, REVERSE: T3, Reverse: 13 ng/dL (ref 8–25)

## 2021-11-29 ENCOUNTER — Encounter: Payer: Self-pay | Admitting: Family Medicine

## 2021-11-29 DIAGNOSIS — R790 Abnormal level of blood mineral: Secondary | ICD-10-CM

## 2021-12-01 ENCOUNTER — Telehealth: Payer: Self-pay | Admitting: Hematology and Oncology

## 2021-12-01 NOTE — Telephone Encounter (Signed)
Scheduled appt per 1/12 referral. Pt is aware of date and time. Pt is aware to arrive 15 mins prior to apt.

## 2021-12-10 ENCOUNTER — Other Ambulatory Visit: Payer: Self-pay | Admitting: Family Medicine

## 2021-12-10 ENCOUNTER — Other Ambulatory Visit: Payer: Self-pay

## 2021-12-10 DIAGNOSIS — F900 Attention-deficit hyperactivity disorder, predominantly inattentive type: Secondary | ICD-10-CM

## 2021-12-12 ENCOUNTER — Other Ambulatory Visit (HOSPITAL_BASED_OUTPATIENT_CLINIC_OR_DEPARTMENT_OTHER): Payer: Self-pay

## 2021-12-12 MED ORDER — METHYLPHENIDATE HCL 10 MG PO TABS
10.0000 mg | ORAL_TABLET | Freq: Three times a day (TID) | ORAL | 0 refills | Status: DC
  Filled 2021-12-12: qty 135, 45d supply, fill #0

## 2021-12-13 ENCOUNTER — Encounter: Payer: Self-pay | Admitting: Family Medicine

## 2021-12-14 ENCOUNTER — Other Ambulatory Visit (HOSPITAL_BASED_OUTPATIENT_CLINIC_OR_DEPARTMENT_OTHER): Payer: Self-pay

## 2021-12-14 ENCOUNTER — Telehealth: Payer: Self-pay | Admitting: Hematology and Oncology

## 2021-12-14 MED ORDER — SPIRONOLACTONE 100 MG PO TABS
ORAL_TABLET | Freq: Every day | ORAL | 3 refills | Status: DC
  Filled 2021-12-14: qty 90, 90d supply, fill #0
  Filled 2022-04-16: qty 90, 90d supply, fill #1
  Filled 2022-08-01: qty 90, 90d supply, fill #2
  Filled 2022-11-11: qty 90, 90d supply, fill #3

## 2021-12-14 NOTE — Telephone Encounter (Signed)
Scheduled appointment per 01/26 los. Patient aware of new appointment times. Patient can only do Friday afternoons.

## 2021-12-14 NOTE — Telephone Encounter (Signed)
#  90 w/ 3 refills sent.

## 2021-12-15 ENCOUNTER — Inpatient Hospital Stay: Payer: 59 | Admitting: Hematology and Oncology

## 2021-12-22 ENCOUNTER — Encounter: Payer: Self-pay | Admitting: Hematology and Oncology

## 2021-12-22 ENCOUNTER — Inpatient Hospital Stay: Payer: 59 | Attending: Hematology and Oncology | Admitting: Hematology and Oncology

## 2021-12-22 ENCOUNTER — Other Ambulatory Visit: Payer: Self-pay

## 2021-12-22 DIAGNOSIS — D75838 Other thrombocytosis: Secondary | ICD-10-CM | POA: Diagnosis not present

## 2021-12-22 DIAGNOSIS — E611 Iron deficiency: Secondary | ICD-10-CM

## 2021-12-22 NOTE — Assessment & Plan Note (Signed)
She has iron deficiency without anemia I recommend oral iron supplement and recheck in 3 months

## 2021-12-22 NOTE — Progress Notes (Signed)
[place] NOTE  Patient Care Team: Luetta Nutting, DO as PCP - General (Family Medicine)  ASSESSMENT & PLAN:  Iron deficiency She has iron deficiency without anemia I recommend oral iron supplement and recheck in 3 months  Reactive thrombocytosis She has reactive thrombocytosis Hopefully, with oral iron supplement, this will resolve Orders Placed This Encounter  Procedures   CBC with Differential ([place] Only)    Standing Status:   Future    Standing Expiration Date:   12/22/2022   Iron and Iron Binding Capacity ([facility] only)    Standing Status:   Future    Standing Expiration Date:   12/22/2022   Ferritin    Standing Status:   Future    Standing Expiration Date:   12/22/2022   Sedimentation rate    Standing Status:   Future    Standing Expiration Date:   12/22/2022   Reticulocytes    Standing Status:   Future    Standing Expiration Date:   12/22/2022    All questions were answered. The patient knows to call the clinic with any problems, questions or concerns.  The total time spent in the appointment was 55 minutes encounter with patients including review of chart and various tests results, discussions about plan of care and coordination of care plan  Heath Lark, MD 2/4/202310:32 AM   CHIEF COMPLAINTS/PURPOSE OF CONSULTATION:  Anemia  HISTORY OF PRESENTING ILLNESS:  Deborah York 66 y.o. female is here because of abnormal CBC with iron deficiency  She was found to have abnormal CBC from her blood draw dated November 24, 2021 Her CBC showed white count of 7.2, hemoglobin 14.4 and platelet count of 428,000 Iron panel revealed iron deficiency with ferritin of 15 She denies recent chest pain on exertion, shortness of breath on minimal exertion, pre-syncopal episodes, or palpitations.  Her only symptom is mild fatigue She had not noticed any recent bleeding such as epistaxis, hematuria or hematochezia The patient denies over the counter NSAID  ingestion. She is not on antiplatelets agents. Her last colonoscopy was performed at [facility] on 02/17/2021 which was reported as normal She sees GI service on a regular basis due to irritable bowel syndrome She had no prior history or diagnosis of cancer. Her age appropriate screening programs are up-to-date. She denies any pica and eats a variety of diet. She never donated blood or received blood transfusion She had history of menorrhagia and borderline anemia before she become postmenopausal in the past She has chronic constipation.  She was never told to take oral iron supplement  MEDICAL HISTORY:  Past Medical History:  Diagnosis Date   Complication of anesthesia    Hypertension    Migraines    PONV (postoperative nausea and vomiting)     SURGICAL HISTORY: Past Surgical History:  Procedure Laterality Date   IRRIGATION AND DEBRIDEMENT SHOULDER Right 03/23/2021   Procedure: IRRIGATION AND DEBRIDEMENT SHOULDER;  Surgeon: [name], MD;  Location: [place];  Service: Orthopedics;  Laterality: Right;   LYSIS OF ADHESION Right 03/23/2021   Procedure: LYSIS OF ADHESION WITH MANIPULATION;  Surgeon: [name], MD;  Location: [place];  Service: Orthopedics;  Laterality: Right;   SHOULDER ACROMIOPLASTY Right 03/23/2021   Procedure: SHOULDER ACROMIOPLASTY;  Surgeon: [name], MD;  Location: [place];  Service: Orthopedics;  Laterality: Right;   SHOULDER ARTHROSCOPY WITH DISTAL CLAVICLE RESECTION Right 03/23/2021   Procedure: SHOULDER ARTHROSCOPY WITH DISTAL CLAVICLE  RESECTION;  Surgeon: [name], MD;  Location: [place];  Service: Orthopedics;  Laterality: Right;   TOE SURGERY      SOCIAL HISTORY: Social History   Socioeconomic History   Marital status: Married    Spouse name: Not on file   Number of children: Not on file   Years of education: Not on file   Highest education level: Not on file  Occupational  History   Not on file  Tobacco Use   Smoking status: Former   Smokeless tobacco: Former  Scientific laboratory technician Use: Never used  Substance and Sexual Activity   Alcohol use: Not Currently   Drug use: Never   Sexual activity: Not on file  Other Topics Concern   Not on file  Social History Narrative   Caffeine- one cup daily, diet coke daily.  Education: Family MD (healthy weight wellness).      Social Determinants of Health   Financial Resource Strain: Not on file  Food Insecurity: Not on file  Transportation Needs: Not on file  Physical Activity: Not on file  Stress: Not on file  Social Connections: Not on file  Intimate Partner Violence: Not on file    FAMILY HISTORY: Family History  Problem Relation Age of Onset   Cancer Mother    Migraines Mother    Hypertension Mother    Heart failure Father    Migraines Maternal Grandmother    Migraines Other     ALLERGIES:  is allergic to sulfa antibiotics.  MEDICATIONS:  Current Outpatient Medications  Medication Sig Dispense Refill   Calcium Carbonate-Vitamin D 600-400 MG-UNIT tablet Take 1 tablet by mouth 2 (two) times daily. 60 tablet 11   desvenlafaxine (PRISTIQ) 50 MG 24 hr tablet TAKE 1 TABLET BY MOUTH ONCE DAILY 90 tablet 2   estradiol (ESTRACE) 0.1 MG/GM vaginal cream Apply intravaginally qhs x 1 week then 2-3 nights weekly     gabapentin (NEURONTIN) 600 MG tablet TAKE 1 TABLET BY MOUTH NIGHTLY AS NEEDED 90 tablet 1   Galcanezumab-gnlm (EMGALITY) 120 MG/ML SOAJ Inject into the skin.     linaclotide (LINZESS) 290 MCG CAPS capsule TAKE ONE CAPSULE (290 MCG DOSE) BY MOUTH DAILY. 90 capsule 3   methylphenidate (RITALIN) 10 MG tablet Take 1 tablet (10 mg total) by mouth 3 (three) times daily with meals. 135 tablet 0   norethindrone-ethinyl estradiol (FEMHRT 1/5) 1-5 MG-MCG TABS tablet Take 1 tablet by mouth daily. 84 tablet 3   ondansetron (ZOFRAN ODT) 4 MG disintegrating tablet Take 1 tablet (4 mg total) by mouth every 8  (eight) hours as needed for nausea or vomiting. 20 tablet 6   Pilocarpine HCl (VUITY) 1.25 % SOLN Instill 1 drop into both eyes once a day 5 mL 11   Prasterone (INTRAROSA) 6.5 MG INST Place 6.5 mg vaginally daily. 28 each 3   Rimegepant Sulfate (NURTEC) 75 MG TBDP Take 75 mg by mouth daily as needed. For migraines. Take as close to onset of migraine as possible. One daily maximum. 16 tablet 6   rosuvastatin (CRESTOR) 10 MG tablet Take 1 tablet (10 mg total) by mouth daily. 90 tablet 3   spironolactone (ALDACTONE) 100 MG tablet TAKE 1 TABLET BY MOUTH ONCE DAILY 90 tablet 3   SUMAtriptan (IMITREX) 100 MG tablet TAKE ONE TABLET BY MOUTH TWICE DAILY AS NEEDED FOR MIGRAINE, MAX 2 TABLETS PER DAY 24 tablet 1   telmisartan (MICARDIS) 20 MG tablet Take 1 tablet by  mouth daily 90 tablet 3   tretinoin (RETIN-A) 0.1 % cream APPLY 1 DIME-SIZED AMOUNT TO THE ENTIRE FACE NIGHTLY AS TOLERATED 45 g 3   No current facility-administered medications for this visit.    REVIEW OF SYSTEMS:   Constitutional: Denies fevers, chills or abnormal night sweats Eyes: Denies blurriness of vision, double vision or watery eyes Ears, nose, mouth, throat, and face: Denies mucositis or sore throat Respiratory: Denies cough, dyspnea or wheezes Cardiovascular: Denies palpitation, chest discomfort or lower extremity swelling Gastrointestinal:  Denies nausea, heartburn or change in bowel habits Skin: Denies abnormal skin rashes Lymphatics: Denies new lymphadenopathy or easy bruising Neurological:Denies numbness, tingling or new weaknesses Behavioral/Psych: Mood is stable, no new changes  All other systems were reviewed with the patient and are negative.  PHYSICAL EXAMINATION: ECOG PERFORMANCE STATUS: 0 - Asymptomatic  Vitals:   12/22/21 1347  BP: 131/73  Pulse: 89  Resp: 18  Temp: (!) 97.5 F (36.4 C)  SpO2: 100%   Filed Weights   12/22/21 1347  Weight: 138 lb (62.6 kg)    GENERAL:alert, no distress and  comfortable SKIN: skin color, texture, turgor are normal, no rashes or significant lesions EYES: normal, conjunctiva are pink and non-injected, sclera clear OROPHARYNX:no exudate, no erythema and lips, buccal mucosa, and tongue normal  NECK: supple, thyroid normal size, non-tender, without nodularity LYMPH:  no palpable lymphadenopathy in the cervical, axillary or inguinal LUNGS: clear to auscultation and percussion with normal breathing effort HEART: regular rate & rhythm and no murmurs and no lower extremity edema ABDOMEN:abdomen soft, non-tender and normal bowel sounds Musculoskeletal:no cyanosis of digits and no clubbing  PSYCH: alert & oriented x 3 with fluent speech NEURO: no focal motor/sensory deficits

## 2021-12-22 NOTE — Assessment & Plan Note (Signed)
She has reactive thrombocytosis Hopefully, with oral iron supplement, this will resolve

## 2021-12-23 ENCOUNTER — Encounter: Payer: Self-pay | Admitting: Hematology and Oncology

## 2022-01-01 ENCOUNTER — Other Ambulatory Visit (HOSPITAL_BASED_OUTPATIENT_CLINIC_OR_DEPARTMENT_OTHER): Payer: Self-pay

## 2022-01-02 ENCOUNTER — Ambulatory Visit: Payer: 59 | Admitting: Neurology

## 2022-01-02 ENCOUNTER — Encounter: Payer: Self-pay | Admitting: Neurology

## 2022-01-02 DIAGNOSIS — G43709 Chronic migraine without aura, not intractable, without status migrainosus: Secondary | ICD-10-CM | POA: Diagnosis not present

## 2022-01-02 NOTE — Progress Notes (Signed)
Botox- 200 units x 1 vial Lot: [id number] Expiration: 07/2024 NDC: 3672-5500-16  Bacteriostatic 0.9% Sodium Chloride- 32mL total Lot: [id number] Expiration: 12/20/2022 NDC: 9558-3167-42  Dx: D52.589 B/B

## 2022-01-02 NOTE — Progress Notes (Signed)
Consent Form Botulism Toxin Injection For Chronic Migraine  01/02/2022: first botox. Baseline: daily headaches and 15-20 migraine days a month.   Reviewed orally with patient, additionally signature is on file:  Botulism toxin has been approved by the Federal drug administration for treatment of chronic migraine. Botulism toxin does not cure chronic migraine and it may not be effective in some patients.  The administration of botulism toxin is accomplished by injecting a small amount of toxin into the muscles of the neck and head. Dosage must be titrated for each individual. Any benefits resulting from botulism toxin tend to wear off after 3 months with a repeat injection required if benefit is to be maintained. Injections are usually done every 3-4 months with maximum effect peak achieved by about 2 or 3 weeks. Botulism toxin is expensive and you should be sure of what costs you will incur resulting from the injection.  The side effects of botulism toxin use for chronic migraine may include:   -Transient, and usually mild, facial weakness with facial injections  -Transient, and usually mild, head or neck weakness with head/neck injections  -Reduction or loss of forehead facial animation due to forehead muscle weakness  -Eyelid drooping  -Dry eye  -Pain at the site of injection or bruising at the site of injection  -Double vision  -Potential unknown long term risks  Contraindications: You should not have Botox if you are pregnant, nursing, allergic to albumin, have an infection, skin condition, or muscle weakness at the site of the injection, or have myasthenia gravis, Lambert-Eaton syndrome, or ALS.  It is also possible that as with any injection, there may be an allergic reaction or no effect from the medication. Reduced effectiveness after repeated injections is sometimes seen and rarely infection at the injection site may occur. All care will be taken to prevent these side effects. If  therapy is given over a long time, atrophy and wasting in the muscle injected may occur. Occasionally the patient's become refractory to treatment because they develop antibodies to the toxin. In this event, therapy needs to be modified.  I have read the above information and consent to the administration of botulism toxin.    BOTOX PROCEDURE NOTE FOR MIGRAINE HEADACHE    Contraindications and precautions discussed with patient(above). Aseptic procedure was observed and patient tolerated procedure. Procedure performed by Dr. Georgia Dom  The condition has existed for more than 6 months, and pt does not have a diagnosis of ALS, Myasthenia Gravis or Lambert-Eaton Syndrome.  Risks and benefits of injections discussed and pt agrees to proceed with the procedure.  Written consent obtained  These injections are medically necessary. Pt  receives good benefits from these injections. These injections do not cause sedations or hallucinations which the oral therapies may cause.  Description of procedure:  The patient was placed in a sitting position. The standard protocol was used for Botox as follows, with 5 units of Botox injected at each site:   -Procerus muscle, midline injection  -Corrugator muscle, bilateral injection  -Frontalis muscle, bilateral injection, with 2 sites each side, medial injection was performed in the upper one third of the frontalis muscle, in the region vertical from the medial inferior edge of the superior orbital rim. The lateral injection was again in the upper one third of the forehead vertically above the lateral limbus of the cornea, 1.5 cm lateral to the medial injection site.  -Temporalis muscle injection, 4 sites, bilaterally. The first injection was 3 cm above the  tragus of the ear, second injection site was 1.5 cm to 3 cm up from the first injection site in line with the tragus of the ear. The third injection site was 1.5-3 cm forward between the first 2 injection  sites. The fourth injection site was 1.5 cm posterior to the second injection site.   -Occipitalis muscle injection, 3 sites, bilaterally. The first injection was done one half way between the occipital protuberance and the tip of the mastoid process behind the ear. The second injection site was done lateral and superior to the first, 1 fingerbreadth from the first injection. The third injection site was 1 fingerbreadth superiorly and medially from the first injection site.  -Cervical paraspinal muscle injection, 2 sites, bilateral knee first injection site was 1 cm from the midline of the cervical spine, 3 cm inferior to the lower border of the occipital protuberance. The second injection site was 1.5 cm superiorly and laterally to the first injection site.  -Trapezius muscle injection was performed at 3 sites, bilaterally. The first injection site was in the upper trapezius muscle halfway between the inflection point of the neck, and the acromion. The second injection site was one half way between the acromion and the first injection site. The third injection was done between the first injection site and the inflection point of the neck.   Will return for repeat injection in 3 months.   155 units of Botox was used, 45 Botox not injected was wasted. The patient tolerated the procedure well, there were no complications of the above procedure.

## 2022-01-09 ENCOUNTER — Other Ambulatory Visit (HOSPITAL_BASED_OUTPATIENT_CLINIC_OR_DEPARTMENT_OTHER): Payer: Self-pay

## 2022-01-09 ENCOUNTER — Other Ambulatory Visit (INDEPENDENT_AMBULATORY_CARE_PROVIDER_SITE_OTHER): Payer: Self-pay | Admitting: Family Medicine

## 2022-01-09 MED ORDER — DOXYCYCLINE HYCLATE 100 MG PO TABS
100.0000 mg | ORAL_TABLET | Freq: Two times a day (BID) | ORAL | 3 refills | Status: AC
  Filled 2022-01-09: qty 20, 10d supply, fill #0

## 2022-01-12 ENCOUNTER — Other Ambulatory Visit (HOSPITAL_COMMUNITY)
Admission: RE | Admit: 2022-01-12 | Discharge: 2022-01-12 | Disposition: A | Discharge status: Home / Self Care | Payer: 59 | Source: Ambulatory Visit | Attending: Obstetrics and Gynecology | Admitting: Obstetrics and Gynecology

## 2022-01-12 ENCOUNTER — Other Ambulatory Visit (HOSPITAL_BASED_OUTPATIENT_CLINIC_OR_DEPARTMENT_OTHER): Payer: Self-pay

## 2022-01-12 ENCOUNTER — Encounter: Payer: Self-pay | Admitting: Obstetrics and Gynecology

## 2022-01-12 ENCOUNTER — Other Ambulatory Visit: Payer: Self-pay

## 2022-01-12 ENCOUNTER — Ambulatory Visit (INDEPENDENT_AMBULATORY_CARE_PROVIDER_SITE_OTHER): Payer: 59 | Admitting: Obstetrics and Gynecology

## 2022-01-12 VITALS — BP 115/60 | HR 75 | Ht 67.0 in | Wt 136.0 lb

## 2022-01-12 DIAGNOSIS — Z01419 Encounter for gynecological examination (general) (routine) without abnormal findings: Secondary | ICD-10-CM | POA: Diagnosis not present

## 2022-01-12 MED ORDER — CLINDAMYCIN PHOSPHATE 1 % EX SOLN
Freq: Two times a day (BID) | CUTANEOUS | 2 refills | Status: DC
  Filled 2022-01-12: qty 60, 30d supply, fill #0

## 2022-01-12 NOTE — Progress Notes (Signed)
GYNECOLOGY ANNUAL PREVENTATIVE CARE ENCOUNTER NOTE  History:     Deborah York is a 66 y.o. G69P1001 female here for a routine annual gynecologic exam.  Current complaints: vaginal dryness and atrophy. Uses oral estradiol and prasterone.  This is prescribed by her PCP.  Denies abnormal vaginal bleeding, discharge, pelvic pain, problems with intercourse or other gynecologic concerns.  Previous GYN left. She is new to our group.   Not sexually active, partner had a stroke several years ago.    Gynecologic History No LMP recorded. Patient is postmenopausal. Contraception: post menopausal status Last Pap:2020. Result was normal with negative HPV Last Mammogram: 08/2021.  Result was normal   Obstetric History OB History  Gravida Para Term Preterm AB Living  1 1 1     1   SAB IAB Ectopic Multiple Live Births               # Outcome Date GA Lbr Len/2nd Weight Sex Delivery Anes PTL Lv  1 Term             Past Medical History:  Diagnosis Date   Complication of anesthesia    Hypertension    Migraines    PONV (postoperative nausea and vomiting)     Past Surgical History:  Procedure Laterality Date   IRRIGATION AND DEBRIDEMENT SHOULDER Right 03/23/2021   Procedure: IRRIGATION AND DEBRIDEMENT SHOULDER;  Surgeon: [name], MD;  Location: [place];  Service: Orthopedics;  Laterality: Right;   LYSIS OF ADHESION Right 03/23/2021   Procedure: LYSIS OF ADHESION WITH MANIPULATION;  Surgeon: [name], MD;  Location: [place];  Service: Orthopedics;  Laterality: Right;   SHOULDER ACROMIOPLASTY Right 03/23/2021   Procedure: SHOULDER ACROMIOPLASTY;  Surgeon: [name], MD;  Location: [place];  Service: Orthopedics;  Laterality: Right;   SHOULDER ARTHROSCOPY WITH DISTAL CLAVICLE RESECTION Right 03/23/2021   Procedure: SHOULDER ARTHROSCOPY WITH DISTAL CLAVICLE RESECTION;  Surgeon: [name], MD;  Location: [place];   Service: Orthopedics;  Laterality: Right;   TOE SURGERY      Current Outpatient Medications on File Prior to Visit  Medication Sig Dispense Refill   Calcium Carbonate-Vitamin D 600-400 MG-UNIT tablet Take 1 tablet by mouth 2 (two) times daily. 60 tablet 11   desvenlafaxine (PRISTIQ) 50 MG 24 hr tablet TAKE 1 TABLET BY MOUTH ONCE DAILY 90 tablet 2   doxycycline (VIBRA-TABS) 100 MG tablet Take 1 tablet (100 mg total) by mouth 2 (two) times daily for 10 days. 20 tablet 3   estradiol (ESTRACE) 0.1 MG/GM vaginal cream Apply intravaginally qhs x 1 week then 2-3 nights weekly     gabapentin (NEURONTIN) 600 MG tablet TAKE 1 TABLET BY MOUTH NIGHTLY AS NEEDED 90 tablet 1   Galcanezumab-gnlm (EMGALITY) 120 MG/ML SOAJ Inject into the skin.     linaclotide (LINZESS) 290 MCG CAPS capsule TAKE ONE CAPSULE (290 MCG DOSE) BY MOUTH DAILY. 90 capsule 3   methylphenidate (RITALIN) 10 MG tablet Take 1 tablet (10 mg total) by mouth 3 (three) times daily with meals. 135 tablet 0   norethindrone-ethinyl estradiol (FEMHRT 1/5) 1-5 MG-MCG TABS tablet Take 1 tablet by mouth daily. 84 tablet 3   ondansetron (ZOFRAN ODT) 4 MG disintegrating tablet Take 1 tablet (4 mg total) by mouth every 8 (eight) hours as needed for nausea or vomiting. 20 tablet 6   Pilocarpine HCl (VUITY) 1.25 % SOLN Instill 1 drop into both  eyes once a day 5 mL 11   Prasterone (INTRAROSA) 6.5 MG INST Place 6.5 mg vaginally daily. 28 each 3   Rimegepant Sulfate (NURTEC) 75 MG TBDP Take 75 mg by mouth daily as needed. For migraines. Take as close to onset of migraine as possible. One daily maximum. 16 tablet 6   rosuvastatin (CRESTOR) 10 MG tablet Take 1 tablet (10 mg total) by mouth daily. 90 tablet 3   spironolactone (ALDACTONE) 100 MG tablet TAKE 1 TABLET BY MOUTH ONCE DAILY 90 tablet 3   SUMAtriptan (IMITREX) 100 MG tablet TAKE ONE TABLET BY MOUTH TWICE DAILY AS NEEDED FOR MIGRAINE, MAX 2 TABLETS PER DAY 24 tablet 1   telmisartan (MICARDIS) 20 MG  tablet Take 1 tablet by mouth daily 90 tablet 3   tretinoin (RETIN-A) 0.1 % cream APPLY 1 DIME-SIZED AMOUNT TO THE ENTIRE FACE NIGHTLY AS TOLERATED 45 g 3   No current facility-administered medications on file prior to visit.    Allergies  Allergen Reactions   Sulfa Antibiotics Rash    Social History:  reports that she has quit smoking. She has quit using smokeless tobacco. She reports that she does not currently use alcohol. She reports that she does not use drugs.  Family History  Problem Relation Age of Onset   Cancer Mother    Migraines Mother    Hypertension Mother    Heart failure Father    Migraines Maternal Grandmother    Migraines Other     The following portions of the patient's history were reviewed and updated as appropriate: allergies, current medications, past family history, past medical history, past social history, past surgical history and problem list.  Review of Systems Pertinent items noted in HPI and remainder of comprehensive ROS otherwise negative.  Physical Exam:  BP 115/60    Pulse 75    Ht 5\' 7"  (1.702 m)    Wt 136 lb (61.7 kg)    BMI 21.30 kg/m  CONSTITUTIONAL: Well-developed, well-nourished female in no acute distress.  HENT:  Normocephalic, atraumatic, External right and left ear normal.  EYES: Conjunctivae and EOM are normal. Pupils are equal, round, and reactive to light. No scleral icterus.  NECK: Normal range of motion, supple, no masses.  Normal thyroid.  SKIN: Skin is warm and dry. No rash noted. Not diaphoretic. No erythema. No pallor. MUSCULOSKELETAL: Normal range of motion. No tenderness.  No cyanosis, clubbing, or edema. NEUROLOGIC: Alert and oriented to person, place, and time. Normal reflexes, muscle tone coordination.  PSYCHIATRIC: Normal mood and affect. Normal behavior. Normal judgment and thought content. CARDIOVASCULAR: Normal heart rate noted, regular rhythm RESPIRATORY: Clear to auscultation bilaterally. Effort and breath sounds  normal, no problems with respiration noted. BREASTS: Symmetric in size. No masses, tenderness, skin changes, nipple drainage, or lymphadenopathy bilaterally. Performed in the presence of a chaperone. ABDOMEN: Soft, no distention noted.  No tenderness, rebound or guarding.  PELVIC: Normal appearing external genitalia and urethral meatus; normal appearing vaginal mucosa and cervix.  No abnormal vaginal discharge noted.  Pap smear obtained.  Normal uterine size, no other palpable masses, no uterine or adnexal tenderness. 2 small ?folliculitus, boil. No odor or drainage.  Performed in the presence of a chaperone.   Assessment and Plan:   1. Well woman exam  - Cytology - PAP( )  - RX: Clindamycin solutions for folliculitis, boil. Patient does not shave in this area.    Will follow up results of pap smear and manage accordingly. Mammogram done-  resulted and reviewed by myself.   Routine preventative health maintenance measures emphasized. Please refer to After Visit Summary for other counseling recommendations.   Travonne Schowalter, Artist Pais, [place] for [employer], [place]

## 2022-01-17 LAB — CYTOLOGY - PAP
Comment: NEGATIVE
Diagnosis: NEGATIVE
Diagnosis: REACTIVE
High risk HPV: NEGATIVE

## 2022-01-19 DIAGNOSIS — L570 Actinic keratosis: Secondary | ICD-10-CM | POA: Diagnosis not present

## 2022-01-19 DIAGNOSIS — L814 Other melanin hyperpigmentation: Secondary | ICD-10-CM | POA: Diagnosis not present

## 2022-01-19 DIAGNOSIS — L2089 Other atopic dermatitis: Secondary | ICD-10-CM | POA: Diagnosis not present

## 2022-01-19 DIAGNOSIS — D225 Melanocytic nevi of trunk: Secondary | ICD-10-CM | POA: Diagnosis not present

## 2022-01-19 DIAGNOSIS — L821 Other seborrheic keratosis: Secondary | ICD-10-CM | POA: Diagnosis not present

## 2022-01-19 DIAGNOSIS — D2261 Melanocytic nevi of right upper limb, including shoulder: Secondary | ICD-10-CM | POA: Diagnosis not present

## 2022-01-19 DIAGNOSIS — D1801 Hemangioma of skin and subcutaneous tissue: Secondary | ICD-10-CM | POA: Diagnosis not present

## 2022-01-19 DIAGNOSIS — Z85820 Personal history of malignant melanoma of skin: Secondary | ICD-10-CM | POA: Diagnosis not present

## 2022-01-19 DIAGNOSIS — D485 Neoplasm of uncertain behavior of skin: Secondary | ICD-10-CM | POA: Diagnosis not present

## 2022-01-24 ENCOUNTER — Other Ambulatory Visit: Payer: Self-pay | Admitting: Family Medicine

## 2022-01-24 ENCOUNTER — Other Ambulatory Visit (HOSPITAL_BASED_OUTPATIENT_CLINIC_OR_DEPARTMENT_OTHER): Payer: Self-pay

## 2022-01-24 DIAGNOSIS — F900 Attention-deficit hyperactivity disorder, predominantly inattentive type: Secondary | ICD-10-CM

## 2022-01-24 MED ORDER — METHYLPHENIDATE HCL 10 MG PO TABS
10.0000 mg | ORAL_TABLET | Freq: Three times a day (TID) | ORAL | 0 refills | Status: DC
  Filled 2022-01-24: qty 135, 45d supply, fill #0

## 2022-02-02 ENCOUNTER — Other Ambulatory Visit (HOSPITAL_BASED_OUTPATIENT_CLINIC_OR_DEPARTMENT_OTHER): Payer: Self-pay

## 2022-02-23 ENCOUNTER — Other Ambulatory Visit (HOSPITAL_BASED_OUTPATIENT_CLINIC_OR_DEPARTMENT_OTHER): Payer: Self-pay

## 2022-02-23 MED ORDER — SUMATRIPTAN SUCCINATE 100 MG PO TABS
ORAL_TABLET | ORAL | 1 refills | Status: DC
  Filled 2022-02-23 – 2022-03-06 (×2): qty 24, 90d supply, fill #0
  Filled 2022-08-29 – 2022-08-30 (×2): qty 24, 90d supply, fill #1

## 2022-02-26 ENCOUNTER — Other Ambulatory Visit (HOSPITAL_BASED_OUTPATIENT_CLINIC_OR_DEPARTMENT_OTHER): Payer: Self-pay

## 2022-03-02 ENCOUNTER — Other Ambulatory Visit (HOSPITAL_BASED_OUTPATIENT_CLINIC_OR_DEPARTMENT_OTHER): Payer: Self-pay

## 2022-03-05 ENCOUNTER — Other Ambulatory Visit (HOSPITAL_BASED_OUTPATIENT_CLINIC_OR_DEPARTMENT_OTHER): Payer: Self-pay

## 2022-03-06 ENCOUNTER — Other Ambulatory Visit (HOSPITAL_BASED_OUTPATIENT_CLINIC_OR_DEPARTMENT_OTHER): Payer: Self-pay

## 2022-03-07 ENCOUNTER — Other Ambulatory Visit: Payer: Self-pay | Admitting: Family Medicine

## 2022-03-07 ENCOUNTER — Telehealth: Payer: Self-pay | Admitting: Neurology

## 2022-03-07 DIAGNOSIS — F900 Attention-deficit hyperactivity disorder, predominantly inattentive type: Secondary | ICD-10-CM

## 2022-03-07 NOTE — Telephone Encounter (Signed)
Patient has a scheduled Botox appt on 05/09, faxed clinicals to [phone]. Pending #[id number]. ?

## 2022-03-09 ENCOUNTER — Other Ambulatory Visit (HOSPITAL_BASED_OUTPATIENT_CLINIC_OR_DEPARTMENT_OTHER): Payer: Self-pay

## 2022-03-09 MED ORDER — METHYLPHENIDATE HCL 10 MG PO TABS
10.0000 mg | ORAL_TABLET | Freq: Three times a day (TID) | ORAL | 0 refills | Status: DC
  Filled 2022-03-09: qty 135, 45d supply, fill #0

## 2022-03-12 ENCOUNTER — Other Ambulatory Visit (HOSPITAL_BASED_OUTPATIENT_CLINIC_OR_DEPARTMENT_OTHER): Payer: Self-pay

## 2022-03-17 ENCOUNTER — Encounter: Payer: Self-pay | Admitting: Family Medicine

## 2022-03-19 ENCOUNTER — Telehealth: Payer: Self-pay

## 2022-03-19 ENCOUNTER — Other Ambulatory Visit (HOSPITAL_BASED_OUTPATIENT_CLINIC_OR_DEPARTMENT_OTHER): Payer: Self-pay

## 2022-03-19 MED ORDER — ROSUVASTATIN CALCIUM 10 MG PO TABS
ORAL_TABLET | ORAL | 3 refills | Status: DC
  Filled 2022-03-19: qty 36, 90d supply, fill #0
  Filled 2022-11-11: qty 36, 90d supply, fill #1

## 2022-03-19 NOTE — Telephone Encounter (Signed)
Completed.

## 2022-03-19 NOTE — Telephone Encounter (Signed)
Received PA request for nurtec. Completed via CMM. Key: [id number]. ? ?I called patient to verify that Nurtec works well and reduces the duration of her migraines per PA request. Patient confirmed this. ? ?PA sent to [employer]. It was approved. The request has been approved. The authorization is effective for a maximum of 12 fills from 03/19/2022 to 03/19/2023, as long as the member is enrolled in their current health plan. The request was approved with a quantity restriction. This has been approved for a max daily dosage of 0.6. A written notification letter will follow with additional details. ?

## 2022-03-20 ENCOUNTER — Other Ambulatory Visit (HOSPITAL_BASED_OUTPATIENT_CLINIC_OR_DEPARTMENT_OTHER): Payer: Self-pay

## 2022-03-23 ENCOUNTER — Inpatient Hospital Stay: Payer: 59 | Attending: Hematology and Oncology

## 2022-03-23 ENCOUNTER — Other Ambulatory Visit: Payer: Self-pay

## 2022-03-23 DIAGNOSIS — E611 Iron deficiency: Secondary | ICD-10-CM | POA: Insufficient documentation

## 2022-03-23 DIAGNOSIS — D75838 Other thrombocytosis: Secondary | ICD-10-CM

## 2022-03-23 LAB — CBC WITH DIFFERENTIAL (CANCER CENTER ONLY)
Abs Immature Granulocytes: 0.02 10*3/uL (ref 0.00–0.07)
Basophils Absolute: 0 10*3/uL (ref 0.0–0.1)
Basophils Relative: 1 %
Eosinophils Absolute: 0 10*3/uL (ref 0.0–0.5)
Eosinophils Relative: 1 %
HCT: 43.1 % (ref 36.0–46.0)
Hemoglobin: 14.2 g/dL (ref 12.0–15.0)
Immature Granulocytes: 0 %
Lymphocytes Relative: 25 %
Lymphs Abs: 1.6 10*3/uL (ref 0.7–4.0)
MCH: 30.1 pg (ref 26.0–34.0)
MCHC: 32.9 g/dL (ref 30.0–36.0)
MCV: 91.3 fL (ref 80.0–100.0)
Monocytes Absolute: 0.5 10*3/uL (ref 0.1–1.0)
Monocytes Relative: 8 %
Neutro Abs: 4.3 10*3/uL (ref 1.7–7.7)
Neutrophils Relative %: 65 %
Platelet Count: 430 10*3/uL — ABNORMAL HIGH (ref 150–400)
RBC: 4.72 MIL/uL (ref 3.87–5.11)
RDW: 13.2 % (ref 11.5–15.5)
WBC Count: 6.5 10*3/uL (ref 4.0–10.5)
nRBC: 0 % (ref 0.0–0.2)

## 2022-03-23 LAB — IRON AND IRON BINDING CAPACITY (CC-WL,HP ONLY)
Iron: 171 ug/dL — ABNORMAL HIGH (ref 28–170)
Saturation Ratios: 41 % — ABNORMAL HIGH (ref 10.4–31.8)
TIBC: 414 ug/dL (ref 250–450)
UIBC: 243 ug/dL (ref 148–442)

## 2022-03-23 LAB — FERRITIN: Ferritin: 19 ng/mL (ref 11–307)

## 2022-03-23 LAB — RETICULOCYTES
Immature Retic Fract: 5.8 % (ref 2.3–15.9)
RBC.: 4.73 MIL/uL (ref 3.87–5.11)
Retic Count, Absolute: 53 10*3/uL (ref 19.0–186.0)
Retic Ct Pct: 1.1 % (ref 0.4–3.1)

## 2022-03-23 LAB — SEDIMENTATION RATE: Sed Rate: 8 mm/hr (ref 0–22)

## 2022-03-26 NOTE — Telephone Encounter (Signed)
I called [employer] spoke with Verdis Frederickson, she states PA was approved PA  # [phone] (03/19/2022-03/20/2023). ?

## 2022-03-27 ENCOUNTER — Ambulatory Visit: Payer: 59 | Admitting: Neurology

## 2022-03-27 DIAGNOSIS — G43709 Chronic migraine without aura, not intractable, without status migrainosus: Secondary | ICD-10-CM

## 2022-03-27 NOTE — Progress Notes (Signed)
Botox- 200 units x 1 vial ?Lot: [id number] ?Expiration: 10/2024 ?NDC: 0023-3921-02 ? ?Bacteriostatic 0.9% Sodium Chloride- 4mL total ?Lot: [id number] ?Expiration: 06/20/2023 ?NDC: 0409-1966-02 ? ?Dx:G43.709 ?B/B ? ?

## 2022-03-27 NOTE — Progress Notes (Signed)
Consent Form ?Botulism Toxin Injection For Chronic Migraine ? ?03/27/2022: doing great, 4 migraine days a month and they are mild and easily treatable. The wether made it a bit worse these last few months but >> 60% improvement. ? ?01/02/2022: first botox. Baseline: daily headaches and 15-20 migraine days a month.  ? ?Reviewed orally with patient, additionally signature is on file: ? ?Botulism toxin has been approved by the Federal drug administration for treatment of chronic migraine. Botulism toxin does not cure chronic migraine and it may not be effective in some patients. ? ?The administration of botulism toxin is accomplished by injecting a small amount of toxin into the muscles of the neck and head. Dosage must be titrated for each individual. Any benefits resulting from botulism toxin tend to wear off after 3 months with a repeat injection required if benefit is to be maintained. Injections are usually done every 3-4 months with maximum effect peak achieved by about 2 or 3 weeks. Botulism toxin is expensive and you should be sure of what costs you will incur resulting from the injection. ? ?The side effects of botulism toxin use for chronic migraine may include: ? ? -Transient, and usually mild, facial weakness with facial injections ? -Transient, and usually mild, head or neck weakness with head/neck injections ? -Reduction or loss of forehead facial animation due to forehead muscle weakness ? -Eyelid drooping ? -Dry eye ? -Pain at the site of injection or bruising at the site of injection ? -Double vision ? -Potential unknown long term risks ? ?Contraindications: You should not have Botox if you are pregnant, nursing, allergic to albumin, have an infection, skin condition, or muscle weakness at the site of the injection, or have myasthenia gravis, Lambert-Eaton syndrome, or ALS. ? ?It is also possible that as with any injection, there may be an allergic reaction or no effect from the medication. Reduced  effectiveness after repeated injections is sometimes seen and rarely infection at the injection site may occur. All care will be taken to prevent these side effects. If therapy is given over a long time, atrophy and wasting in the muscle injected may occur. Occasionally the patient's become refractory to treatment because they develop antibodies to the toxin. In this event, therapy needs to be modified. ? ?I have read the above information and consent to the administration of botulism toxin. ? ? ? ?BOTOX PROCEDURE NOTE FOR MIGRAINE HEADACHE ? ? ? ?Contraindications and precautions discussed with patient(above). Aseptic procedure was observed and patient tolerated procedure. Procedure performed by Dr. Georgia Dom ? ?The condition has existed for more than 6 months, and pt does not have a diagnosis of ALS, Myasthenia Gravis or Lambert-Eaton Syndrome.  Risks and benefits of injections discussed and pt agrees to proceed with the procedure.  Written consent obtained ? ?These injections are medically necessary. Pt  receives good benefits from these injections. These injections do not cause sedations or hallucinations which the oral therapies may cause. ? ?Description of procedure: ? ?The patient was placed in a sitting position. The standard protocol was used for Botox as follows, with 5 units of Botox injected at each site: ? ? ?-Procerus muscle, midline injection ? ?-Corrugator muscle, bilateral injection ? ?-Frontalis muscle, bilateral injection, with 2 sites each side, medial injection was performed in the upper one third of the frontalis muscle, in the region vertical from the medial inferior edge of the superior orbital rim. The lateral injection was again in the upper one third of the forehead  vertically above the lateral limbus of the cornea, 1.5 cm lateral to the medial injection site. ? ?-Temporalis muscle injection, 4 sites, bilaterally. The first injection was 3 cm above the tragus of the ear, second injection  site was 1.5 cm to 3 cm up from the first injection site in line with the tragus of the ear. The third injection site was 1.5-3 cm forward between the first 2 injection sites. The fourth injection site was 1.5 cm posterior to the second injection site.  ? ?-Occipitalis muscle injection, 3 sites, bilaterally. The first injection was done one half way between the occipital protuberance and the tip of the mastoid process behind the ear. The second injection site was done lateral and superior to the first, 1 fingerbreadth from the first injection. The third injection site was 1 fingerbreadth superiorly and medially from the first injection site. ? ?-Cervical paraspinal muscle injection, 2 sites, bilateral knee first injection site was 1 cm from the midline of the cervical spine, 3 cm inferior to the lower border of the occipital protuberance. The second injection site was 1.5 cm superiorly and laterally to the first injection site. ? ?-Trapezius muscle injection was performed at 3 sites, bilaterally. The first injection site was in the upper trapezius muscle halfway between the inflection point of the neck, and the acromion. The second injection site was one half way between the acromion and the first injection site. The third injection was done between the first injection site and the inflection point of the neck. ? ? ?Will return for repeat injection in 3 months. ? ? ?155 units of Botox was used, 45 Botox not injected was wasted. The patient tolerated the procedure well, there were no complications of the above procedure. ? ? ?

## 2022-03-30 ENCOUNTER — Ambulatory Visit: Payer: 59 | Admitting: Hematology and Oncology

## 2022-04-06 ENCOUNTER — Encounter: Payer: Self-pay | Admitting: Hematology and Oncology

## 2022-04-06 ENCOUNTER — Other Ambulatory Visit (HOSPITAL_BASED_OUTPATIENT_CLINIC_OR_DEPARTMENT_OTHER): Payer: Self-pay

## 2022-04-06 ENCOUNTER — Inpatient Hospital Stay: Payer: 59 | Admitting: Hematology and Oncology

## 2022-04-06 MED ORDER — PAXLOVID (300/100) 20 X 150 MG & 10 X 100MG PO TBPK
ORAL_TABLET | ORAL | 0 refills | Status: DC
  Filled 2022-04-06: qty 30, 30d supply, fill #0

## 2022-04-17 ENCOUNTER — Other Ambulatory Visit (HOSPITAL_BASED_OUTPATIENT_CLINIC_OR_DEPARTMENT_OTHER): Payer: Self-pay

## 2022-04-20 ENCOUNTER — Other Ambulatory Visit (HOSPITAL_BASED_OUTPATIENT_CLINIC_OR_DEPARTMENT_OTHER): Payer: Self-pay

## 2022-04-20 MED ORDER — IBUPROFEN 800 MG PO TABS
ORAL_TABLET | ORAL | 0 refills | Status: DC
  Filled 2022-04-20: qty 20, 5d supply, fill #0

## 2022-04-20 MED ORDER — AMOXICILLIN 500 MG PO CAPS
ORAL_CAPSULE | ORAL | 0 refills | Status: DC
  Filled 2022-04-20: qty 21, 7d supply, fill #0

## 2022-04-20 MED ORDER — DOXYCYCLINE HYCLATE 100 MG PO CAPS
ORAL_CAPSULE | ORAL | 0 refills | Status: DC
  Filled 2022-04-20: qty 16, 8d supply, fill #0

## 2022-04-20 MED ORDER — CHLORHEXIDINE GLUCONATE 0.12 % MT SOLN
OROMUCOSAL | 3 refills | Status: DC
  Filled 2022-04-20: qty 473, 16d supply, fill #0
  Filled 2022-05-03: qty 473, 16d supply, fill #1
  Filled 2022-05-25: qty 473, 16d supply, fill #2
  Filled 2022-06-09: qty 473, 16d supply, fill #3

## 2022-04-29 ENCOUNTER — Other Ambulatory Visit: Payer: Self-pay | Admitting: Family Medicine

## 2022-04-29 DIAGNOSIS — F900 Attention-deficit hyperactivity disorder, predominantly inattentive type: Secondary | ICD-10-CM

## 2022-04-30 ENCOUNTER — Other Ambulatory Visit (HOSPITAL_BASED_OUTPATIENT_CLINIC_OR_DEPARTMENT_OTHER): Payer: Self-pay

## 2022-04-30 MED ORDER — METHYLPHENIDATE HCL 10 MG PO TABS
10.0000 mg | ORAL_TABLET | Freq: Three times a day (TID) | ORAL | 0 refills | Status: DC
  Filled 2022-04-30: qty 135, 45d supply, fill #0

## 2022-05-03 ENCOUNTER — Other Ambulatory Visit (HOSPITAL_BASED_OUTPATIENT_CLINIC_OR_DEPARTMENT_OTHER): Payer: Self-pay

## 2022-05-04 ENCOUNTER — Encounter: Payer: Self-pay | Admitting: Family Medicine

## 2022-05-04 ENCOUNTER — Other Ambulatory Visit (HOSPITAL_BASED_OUTPATIENT_CLINIC_OR_DEPARTMENT_OTHER): Payer: Self-pay

## 2022-05-04 MED ORDER — GABAPENTIN 600 MG PO TABS
ORAL_TABLET | ORAL | 1 refills | Status: DC
  Filled 2022-05-04: qty 90, 90d supply, fill #0

## 2022-05-22 ENCOUNTER — Emergency Department
Admission: EM | Admit: 2022-05-22 | Discharge: 2022-05-22 | Disposition: A | Discharge status: Home / Self Care | Payer: 59 | Source: Home / Self Care | Attending: Family Medicine | Admitting: Family Medicine

## 2022-05-22 ENCOUNTER — Encounter: Payer: Self-pay | Admitting: Emergency Medicine

## 2022-05-22 ENCOUNTER — Other Ambulatory Visit: Payer: Self-pay

## 2022-05-22 DIAGNOSIS — K0889 Other specified disorders of teeth and supporting structures: Secondary | ICD-10-CM | POA: Diagnosis not present

## 2022-05-22 DIAGNOSIS — K047 Periapical abscess without sinus: Secondary | ICD-10-CM

## 2022-05-22 MED ORDER — CLINDAMYCIN HCL 300 MG PO CAPS: 300.0000 mg | ORAL_CAPSULE | Freq: Three times a day (TID) | ORAL | 0 refills | Status: DC

## 2022-05-22 NOTE — Discharge Instructions (Signed)
Take the antibiotic as directed Take with food Call for problems

## 2022-05-22 NOTE — ED Provider Notes (Signed)
[facility]    CSN: [id number] Arrival date & time: 05/22/22  0802      History   Chief Complaint Chief Complaint  Patient presents with   Dental Pain    HPI Deborah [facility] is a 66 y.o. female.   HPI  Patient is here for dental pain.  She thinks she is developing a dental infection.  She has an area on her right lower jaw however there are working on bone grafting to allow implant surgery.  Patient states that this area has gotten red and puffy, with increased pain in the last couple of days.  No fever or chills  Past Medical History:  Diagnosis Date   Complication of anesthesia    Hypertension    Migraines    PONV (postoperative nausea and vomiting)     Patient Active Problem List   Diagnosis Date Noted   Iron deficiency 12/22/2021   Reactive thrombocytosis 12/22/2021   Well adult exam 11/26/2021   Chronic constipation 05/21/2021   Dysthymic disorder 05/21/2021   Aortic atherosclerosis ([place]) 03/10/2021   Right hand pain 03/25/2020   Fracture of humeral head, right, closed 03/04/2020   ADHD (attention deficit hyperactivity disorder) 01/29/2020   Hypertension 01/29/2020   Atrophic vaginitis 06/21/2017   Migraine headache 06/13/2016   Age-related facial wrinkles 12/04/2012   Other specified hypertrophic and atrophic condition of skin 12/04/2012   Ptosis, 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   Rosacea 12/04/2012   Visual field defect 12/04/2012    Past Surgical History:  Procedure Laterality Date   IRRIGATION AND DEBRIDEMENT SHOULDER Right 03/23/2021   Procedure: IRRIGATION AND DEBRIDEMENT SHOULDER;  Surgeon: [name], MD;  Location: [facility];  Service: Orthopedics;  Laterality: Right;   LYSIS OF ADHESION Right 03/23/2021   Procedure: LYSIS OF ADHESION WITH MANIPULATION;  Surgeon: [name], MD;  Location: [facility];  Service: Orthopedics;  Laterality: Right;   SHOULDER ACROMIOPLASTY Right 03/23/2021   Procedure: SHOULDER  ACROMIOPLASTY;  Surgeon: [name], MD;  Location: [facility];  Service: Orthopedics;  Laterality: Right;   SHOULDER ARTHROSCOPY WITH DISTAL CLAVICLE RESECTION Right 03/23/2021   Procedure: SHOULDER ARTHROSCOPY WITH DISTAL CLAVICLE RESECTION;  Surgeon: [name], MD;  Location: [facility];  Service: Orthopedics;  Laterality: Right;   TOE SURGERY      OB History     Gravida  1   Para  1   Term  1   Preterm      AB      Living  1      SAB      IAB      Ectopic      Multiple      Live Births               Home Medications    Prior to Admission medications   Medication Sig Start Date End Date Taking? Authorizing Provider  clindamycin (CLEOCIN) 300 MG capsule Take 1 capsule (300 mg total) by mouth 3 (three) times daily. 05/22/22  Yes Raylene Everts, MD  Calcium Carbonate-Vitamin D 600-400 MG-UNIT tablet Take 1 tablet by mouth 2 (two) times daily. 03/04/20   Silverio Decamp, MD  chlorhexidine (PERIDEX) 0.12 % solution Swish and spit 38ms by mouth 2 times daily as directed on bottle. **Do not swallow** 04/20/22     desvenlafaxine (PRISTIQ) 50 MG 24 hr tablet TAKE 1 TABLET BY MOUTH ONCE DAILY 07/17/21 07/17/22  MZigmund Daniel  Cody, DO  estradiol (ESTRACE) 0.1 MG/GM vaginal cream Apply intravaginally qhs x 1 week then 2-3 nights weekly 06/21/17   [provider]  gabapentin (NEURONTIN) 600 MG tablet TAKE 1 TABLET BY MOUTH NIGHTLY AS NEEDED 05/04/22 05/04/23  Luetta Nutting, DO  Galcanezumab-gnlm [facility]) 120 MG/ML SOAJ Inject into the skin.    [provider]  ibuprofen (ADVIL) 800 MG tablet Take 1 tablet by mouth every 6 - 8 hours as needed for pain 04/20/22     linaclotide (LINZESS) 290 MCG CAPS capsule TAKE ONE CAPSULE (290 MCG DOSE) BY MOUTH DAILY. 11/03/21     methylphenidate (RITALIN) 10 MG tablet Take 1 tablet (10 mg total) by mouth 3 (three) times daily with meals. 04/30/22 06/17/22  Luetta Nutting, DO   norethindrone-ethinyl estradiol (FEMHRT 1/5) 1-5 MG-MCG TABS tablet Take 1 tablet by mouth daily. 07/31/21 07/31/22  Luetta Nutting, DO  ondansetron (ZOFRAN-ODT) 4 MG disintegrating tablet Take 1 tablet (4 mg total) by mouth every 8 (eight) hours as needed for nausea or vomiting. 08/29/21   Luetta Nutting, DO  Prasterone (INTRAROSA) 6.5 MG INST Place 6.5 mg vaginally daily. 07/17/21   Luetta Nutting, DO  Rimegepant Sulfate (NURTEC) 75 MG TBDP Take 75 mg by mouth daily as needed. For migraines. Take as close to onset of migraine as possible. One daily maximum. 09/12/21   Melvenia Beam, MD  rosuvastatin (CRESTOR) 10 MG tablet Take on Monday, Wednesday, Friday 03/19/22   Luetta Nutting, DO  spironolactone (ALDACTONE) 100 MG tablet TAKE 1 TABLET BY MOUTH ONCE DAILY 12/14/21 12/14/22  Luetta Nutting, DO  SUMAtriptan (IMITREX) 100 MG tablet TAKE ONE TABLET BY MOUTH TWICE DAILY AS NEEDED FOR MIGRAINE, MAX 2 TABLETS PER DAY 02/23/22     telmisartan (MICARDIS) 20 MG tablet Take 1 tablet by mouth daily 10/16/21       Family History Family History  Problem Relation Age of Onset   Cancer Mother    Migraines Mother    Hypertension Mother    Heart failure Father    Migraines Maternal Grandmother    Migraines Other     Social History Social History   Tobacco Use   Smoking status: Former   Smokeless tobacco: Former  Scientific laboratory technician Use: Never used  Substance Use Topics   Alcohol use: Not Currently   Drug use: Never     Allergies   Sulfa antibiotics   Review of Systems Review of Systems See HPI  Physical Exam Triage Vital Signs ED Triage Vitals  Enc Vitals Group     BP 05/22/22 0813 125/71     Pulse Rate 05/22/22 0813 83     Resp 05/22/22 0813 16     Temp 05/22/22 0813 98.2 F (36.8 C)     Temp Source 05/22/22 0813 Oral     SpO2 05/22/22 0813 97 %     Weight 05/22/22 0815 132 lb (59.9 kg)     Height 05/22/22 0815 '5\' 7"'$  (1.702 m)     Head Circumference --      Peak Flow --       Pain Score 05/22/22 0814 6     Pain Loc --      Pain Edu? --      Excl. in [place]? --    No data found.  Updated Vital Signs BP 125/71 (BP Location: Left Arm)   Pulse 83   Temp 98.2 F (36.8 C) (Oral)   Resp 16   Ht '5\' 7"'$  (  1.702 m)   Wt 59.9 kg   SpO2 97%   BMI 20.67 kg/m      Physical Exam Constitutional:      General: She is not in acute distress.    Appearance: She is well-developed.  HENT:     Head: Normocephalic and atraumatic.     Mouth/Throat:   Eyes:     Conjunctiva/sclera: Conjunctivae normal.     Pupils: Pupils are equal, round, and reactive to light.  Cardiovascular:     Rate and Rhythm: Normal rate.  Pulmonary:     Effort: Pulmonary effort is normal. No respiratory distress.  Abdominal:     General: There is no distension.     Palpations: Abdomen is soft.  Musculoskeletal:        General: Normal range of motion.     Cervical back: Normal range of motion.  Skin:    General: Skin is warm and dry.  Neurological:     General: No focal deficit present.     Mental Status: She is alert.  Psychiatric:        Mood and Affect: Mood normal.        Behavior: Behavior normal.      UC Treatments / Results  Labs (all labs ordered are listed, but only abnormal results are displayed) Labs Reviewed - No data to display  EKG   Radiology No results found.  Procedures Procedures (including critical care time)  Medications Ordered in UC Medications - No data to display  Initial Impression / Assessment and Plan / UC Course  I have reviewed the triage vital signs and the nursing notes.  Pertinent labs & imaging results that were available during my care of the patient were reviewed by me and considered in my medical decision making (see chart for details).    Final Clinical Impressions(s) / UC Diagnoses   Final diagnoses:  Pain, dental  Dental infection     Discharge Instructions      Take the antibiotic as directed Take with food Call for  problems   ED Prescriptions     Medication Sig Dispense Auth. Provider   clindamycin (CLEOCIN) 300 MG capsule Take 1 capsule (300 mg total) by mouth 3 (three) times daily. 21 capsule Raylene Everts, MD      PDMP not reviewed this encounter.   Raylene Everts, MD 05/22/22 [phone]

## 2022-05-22 NOTE — ED Triage Notes (Signed)
Bone Graft one month ago bottom right gum pain thinks she has infection. Periodontist is out of town.

## 2022-05-25 ENCOUNTER — Other Ambulatory Visit: Payer: Self-pay | Admitting: Family Medicine

## 2022-05-25 ENCOUNTER — Other Ambulatory Visit (HOSPITAL_BASED_OUTPATIENT_CLINIC_OR_DEPARTMENT_OTHER): Payer: Self-pay

## 2022-05-25 MED ORDER — INTRAROSA 6.5 MG VA INST
6.5000 mg | VAGINAL_INSERT | Freq: Every day | VAGINAL | 3 refills | Status: DC
  Filled 2022-05-25: qty 28, 28d supply, fill #0
  Filled 2022-08-29: qty 28, 28d supply, fill #1
  Filled 2022-11-11: qty 28, 28d supply, fill #2
  Filled 2023-01-05: qty 28, 28d supply, fill #3

## 2022-05-28 ENCOUNTER — Other Ambulatory Visit (HOSPITAL_BASED_OUTPATIENT_CLINIC_OR_DEPARTMENT_OTHER): Payer: Self-pay

## 2022-05-29 ENCOUNTER — Other Ambulatory Visit (HOSPITAL_BASED_OUTPATIENT_CLINIC_OR_DEPARTMENT_OTHER): Payer: Self-pay

## 2022-05-29 MED ORDER — CLINDAMYCIN HCL 150 MG PO CAPS
ORAL_CAPSULE | ORAL | 0 refills | Status: DC
  Filled 2022-05-29: qty 24, 8d supply, fill #0

## 2022-06-11 ENCOUNTER — Other Ambulatory Visit (HOSPITAL_BASED_OUTPATIENT_CLINIC_OR_DEPARTMENT_OTHER): Payer: Self-pay

## 2022-06-13 ENCOUNTER — Other Ambulatory Visit (HOSPITAL_BASED_OUTPATIENT_CLINIC_OR_DEPARTMENT_OTHER): Payer: Self-pay

## 2022-06-14 ENCOUNTER — Other Ambulatory Visit (HOSPITAL_BASED_OUTPATIENT_CLINIC_OR_DEPARTMENT_OTHER): Payer: Self-pay

## 2022-06-14 ENCOUNTER — Other Ambulatory Visit: Payer: Self-pay | Admitting: Family Medicine

## 2022-06-14 DIAGNOSIS — F900 Attention-deficit hyperactivity disorder, predominantly inattentive type: Secondary | ICD-10-CM

## 2022-06-14 MED ORDER — METHYLPHENIDATE HCL 10 MG PO TABS
10.0000 mg | ORAL_TABLET | Freq: Three times a day (TID) | ORAL | 0 refills | Status: DC
  Filled 2022-06-14: qty 135, 45d supply, fill #0

## 2022-06-14 MED ORDER — DESVENLAFAXINE SUCCINATE ER 50 MG PO TB24
ORAL_TABLET | Freq: Every day | ORAL | 2 refills | Status: DC
Start: 2022-06-14 — End: 2023-05-11
  Filled 2022-06-14: qty 90, 90d supply, fill #0
  Filled 2022-10-14 – 2022-10-19 (×2): qty 90, 90d supply, fill #1
  Filled 2023-02-05: qty 90, 90d supply, fill #2

## 2022-06-15 ENCOUNTER — Other Ambulatory Visit (HOSPITAL_BASED_OUTPATIENT_CLINIC_OR_DEPARTMENT_OTHER): Payer: Self-pay

## 2022-06-19 ENCOUNTER — Ambulatory Visit: Payer: 59 | Admitting: Neurology

## 2022-06-19 DIAGNOSIS — G43709 Chronic migraine without aura, not intractable, without status migrainosus: Secondary | ICD-10-CM | POA: Diagnosis not present

## 2022-06-19 MED ORDER — ONABOTULINUMTOXINA 200 UNITS IJ SOLR
155.0000 [IU] | Freq: Once | INTRAMUSCULAR | Status: AC
  Administered 2022-06-19: 155 [IU] via INTRAMUSCULAR

## 2022-06-19 NOTE — Progress Notes (Signed)
Consent Form Botulism Toxin Injection For Chronic Migraine  06/19/2022: stable doing amazing! 03/27/2022: doing great, 4 migraine days a month and they are mild and easily treatable. The wether made it a bit worse these last few months but >> 90% improvement.  01/02/2022: first botox. Baseline: daily headaches and 15-20 migraine days a month.   Reviewed orally with patient, additionally signature is on file:  Botulism toxin has been approved by the Federal drug administration for treatment of chronic migraine. Botulism toxin does not cure chronic migraine and it may not be effective in some patients.  The administration of botulism toxin is accomplished by injecting a small amount of toxin into the muscles of the neck and head. Dosage must be titrated for each individual. Any benefits resulting from botulism toxin tend to wear off after 3 months with a repeat injection required if benefit is to be maintained. Injections are usually done every 3-4 months with maximum effect peak achieved by about 2 or 3 weeks. Botulism toxin is expensive and you should be sure of what costs you will incur resulting from the injection.  The side effects of botulism toxin use for chronic migraine may include:   -Transient, and usually mild, facial weakness with facial injections  -Transient, and usually mild, head or neck weakness with head/neck injections  -Reduction or loss of forehead facial animation due to forehead muscle weakness  -Eyelid drooping  -Dry eye  -Pain at the site of injection or bruising at the site of injection  -Double vision  -Potential unknown long term risks  Contraindications: You should not have Botox if you are pregnant, nursing, allergic to albumin, have an infection, skin condition, or muscle weakness at the site of the injection, or have myasthenia gravis, Lambert-Eaton syndrome, or ALS.  It is also possible that as with any injection, there may be an allergic reaction or no effect  from the medication. Reduced effectiveness after repeated injections is sometimes seen and rarely infection at the injection site may occur. All care will be taken to prevent these side effects. If therapy is given over a long time, atrophy and wasting in the muscle injected may occur. Occasionally the patient's become refractory to treatment because they develop antibodies to the toxin. In this event, therapy needs to be modified.  I have read the above information and consent to the administration of botulism toxin.    BOTOX PROCEDURE NOTE FOR MIGRAINE HEADACHE    Contraindications and precautions discussed with patient(above). Aseptic procedure was observed and patient tolerated procedure. Procedure performed by Dr. Georgia Dom  The condition has existed for more than 6 months, and pt does not have a diagnosis of ALS, Myasthenia Gravis or Lambert-Eaton Syndrome.  Risks and benefits of injections discussed and pt agrees to proceed with the procedure.  Written consent obtained  These injections are medically necessary. Pt  receives good benefits from these injections. These injections do not cause sedations or hallucinations which the oral therapies may cause.  Description of procedure:  The patient was placed in a sitting position. The standard protocol was used for Botox as follows, with 5 units of Botox injected at each site:   -Procerus muscle, midline injection  -Corrugator muscle, bilateral injection  -Frontalis muscle, bilateral injection, with 2 sites each side, medial injection was performed in the upper one third of the frontalis muscle, in the region vertical from the medial inferior edge of the superior orbital rim. The lateral injection was again in the upper one  third of the forehead vertically above the lateral limbus of the cornea, 1.5 cm lateral to the medial injection site.  -Temporalis muscle injection, 4 sites, bilaterally. The first injection was 3 cm above the tragus  of the ear, second injection site was 1.5 cm to 3 cm up from the first injection site in line with the tragus of the ear. The third injection site was 1.5-3 cm forward between the first 2 injection sites. The fourth injection site was 1.5 cm posterior to the second injection site.   -Occipitalis muscle injection, 3 sites, bilaterally. The first injection was done one half way between the occipital protuberance and the tip of the mastoid process behind the ear. The second injection site was done lateral and superior to the first, 1 fingerbreadth from the first injection. The third injection site was 1 fingerbreadth superiorly and medially from the first injection site.  -Cervical paraspinal muscle injection, 2 sites, bilateral knee first injection site was 1 cm from the midline of the cervical spine, 3 cm inferior to the lower border of the occipital protuberance. The second injection site was 1.5 cm superiorly and laterally to the first injection site.  -Trapezius muscle injection was performed at 3 sites, bilaterally. The first injection site was in the upper trapezius muscle halfway between the inflection point of the neck, and the acromion. The second injection site was one half way between the acromion and the first injection site. The third injection was done between the first injection site and the inflection point of the neck.   Will return for repeat injection in 3 months.   155 units of Botox was used, 45 Botox not injected was wasted. The patient tolerated the procedure well, there were no complications of the above procedure.

## 2022-06-19 NOTE — Progress Notes (Signed)
Botox- 200 units x 1 vial Lot: [id number] Expiration: 12/2024 NDC: 1712-7871-83  Bacteriostatic 0.9% Sodium Chloride- 42m total Lot: [id number] Expiration: 06/20/2023 NDC: [address]-64 Dx: [id number]/[id number]

## 2022-06-27 ENCOUNTER — Encounter (INDEPENDENT_AMBULATORY_CARE_PROVIDER_SITE_OTHER): Payer: Self-pay

## 2022-06-29 ENCOUNTER — Other Ambulatory Visit (HOSPITAL_BASED_OUTPATIENT_CLINIC_OR_DEPARTMENT_OTHER): Payer: Self-pay

## 2022-07-05 ENCOUNTER — Other Ambulatory Visit (HOSPITAL_BASED_OUTPATIENT_CLINIC_OR_DEPARTMENT_OTHER): Payer: Self-pay

## 2022-07-05 MED ORDER — CHLORHEXIDINE GLUCONATE 0.12 % MT SOLN
15.0000 mL | Freq: Two times a day (BID) | OROMUCOSAL | 2 refills | Status: DC
  Filled 2022-07-05: qty 473, 16d supply, fill #0
  Filled 2022-08-01: qty 473, 16d supply, fill #1

## 2022-08-01 ENCOUNTER — Other Ambulatory Visit: Payer: Self-pay | Admitting: Family Medicine

## 2022-08-01 ENCOUNTER — Other Ambulatory Visit (HOSPITAL_BASED_OUTPATIENT_CLINIC_OR_DEPARTMENT_OTHER): Payer: Self-pay

## 2022-08-01 DIAGNOSIS — F900 Attention-deficit hyperactivity disorder, predominantly inattentive type: Secondary | ICD-10-CM

## 2022-08-01 MED ORDER — METHYLPHENIDATE HCL 10 MG PO TABS
10.0000 mg | ORAL_TABLET | Freq: Three times a day (TID) | ORAL | 0 refills | Status: DC
  Filled 2022-08-01: qty 90, 30d supply, fill #0

## 2022-08-01 NOTE — Telephone Encounter (Signed)
Please contact patient to schedule follow-up appt. Last seen 11/2021. Past due for 17-monthf/u.   Thanks

## 2022-08-01 NOTE — Telephone Encounter (Signed)
LVM for the patient to call and schedule an appointment for a follow up for a med refill. Tvt

## 2022-08-02 ENCOUNTER — Other Ambulatory Visit (HOSPITAL_BASED_OUTPATIENT_CLINIC_OR_DEPARTMENT_OTHER): Payer: Self-pay

## 2022-08-08 ENCOUNTER — Encounter: Payer: Self-pay | Admitting: Family Medicine

## 2022-08-08 ENCOUNTER — Ambulatory Visit: Payer: 59 | Admitting: Family Medicine

## 2022-08-08 ENCOUNTER — Other Ambulatory Visit (HOSPITAL_BASED_OUTPATIENT_CLINIC_OR_DEPARTMENT_OTHER): Payer: Self-pay

## 2022-08-08 DIAGNOSIS — I10 Essential (primary) hypertension: Secondary | ICD-10-CM

## 2022-08-08 DIAGNOSIS — F900 Attention-deficit hyperactivity disorder, predominantly inattentive type: Secondary | ICD-10-CM | POA: Diagnosis not present

## 2022-08-08 MED ORDER — METHYLPHENIDATE HCL 10 MG PO TABS
10.0000 mg | ORAL_TABLET | Freq: Three times a day (TID) | ORAL | 0 refills | Status: DC
  Filled 2022-08-08: qty 270, 90d supply, fill #0
  Filled 2022-09-09: qty 120, 40d supply, fill #0
  Filled 2022-09-11: qty 150, 50d supply, fill #0

## 2022-08-08 NOTE — Assessment & Plan Note (Signed)
She continues to do well with methylphenidate at current strength and dosing.  We'll plan to continue and new rx sent in.  F/u in 6 months.

## 2022-08-08 NOTE — Progress Notes (Signed)
Deborah York - 66 y.o. female MRN [id number]  Date of birth: 1956-01-16  Subjective Chief Complaint  Patient presents with   Medication Refill    HPI Deborah York is a 66 y.o. female here today for follow up visit.  Reports that she is doing quite well at this time.  Methylphenidate at current strength and dosing is working well for her.  BP remains well controlled despite some stress with opening the new [facility] in [place].   Plans to schedule physical in a couple of months.   ROS:  A comprehensive ROS was completed and negative except as noted per HPI  Allergies  Allergen Reactions   Sulfa Antibiotics Rash    Past Medical History:  Diagnosis Date   Complication of anesthesia    Hypertension    Migraines    PONV (postoperative nausea and vomiting)     Past Surgical History:  Procedure Laterality Date   IRRIGATION AND DEBRIDEMENT SHOULDER Right 03/23/2021   Procedure: IRRIGATION AND DEBRIDEMENT SHOULDER;  Surgeon: [name], MD;  Location: [facility];  Service: Orthopedics;  Laterality: Right;   LYSIS OF ADHESION Right 03/23/2021   Procedure: LYSIS OF ADHESION WITH MANIPULATION;  Surgeon: [name], MD;  Location: [facility];  Service: Orthopedics;  Laterality: Right;   SHOULDER ACROMIOPLASTY Right 03/23/2021   Procedure: SHOULDER ACROMIOPLASTY;  Surgeon: [name], MD;  Location: [place] [facility];  Service: Orthopedics;  Laterality: Right;   SHOULDER ARTHROSCOPY WITH DISTAL CLAVICLE RESECTION Right 03/23/2021   Procedure: SHOULDER ARTHROSCOPY WITH DISTAL CLAVICLE RESECTION;  Surgeon: [name], MD;  Location: [facility];  Service: Orthopedics;  Laterality: Right;   TOE SURGERY      Social History   Socioeconomic History   Marital status: Married    Spouse name: Not on file   Number of children: Not on file   Years of education: Not on file   Highest education level: Not on  file  Occupational History   Not on file  Tobacco Use   Smoking status: Former   Smokeless tobacco: Former  Scientific laboratory technician Use: Never used  Substance and Sexual Activity   Alcohol use: Not Currently   Drug use: Never   Sexual activity: Not on file  Other Topics Concern   Not on file  Social History Narrative   Caffeine- one cup daily, diet coke daily.  Education: Family MD (healthy weight wellness).      Social Determinants of Health   Financial Resource Strain: Not on file  Food Insecurity: Not on file  Transportation Needs: Not on file  Physical Activity: Not on file  Stress: Not on file  Social Connections: Not on file    Family History  Problem Relation Age of Onset   Cancer Mother    Migraines Mother    Hypertension Mother    Heart failure Father    Migraines Maternal Grandmother    Migraines Other     Health Maintenance  Topic Date Due   Pneumonia Vaccine 39+ Years old (1 - PCV) 11/24/2022 (Originally 06/11/2021)   COVID-19 Vaccine (5 - Mixed Product risk series) 12/20/2022 (Originally 10/27/2021)   INFLUENZA VACCINE  02/17/2023 (Originally 06/19/2022)   TETANUS/TDAP  08/09/2023 (Originally 09/19/2021)   Hepatitis C Screening  08/09/2023 (Originally 06/11/1974)   MAMMOGRAM  08/26/2023   COLONOSCOPY (Pts 45-36yr Insurance coverage will need to be confirmed)  02/18/2031  DEXA SCAN  Completed   Zoster Vaccines- Shingrix  Completed   HPV VACCINES  Aged Out     ----------------------------------------------------------------------------------------------------------------------------------------------------------------------------------------------------------------- Physical Exam BP 126/69 (BP Location: Left Arm, Patient Position: Sitting, Cuff Size: Normal)   Pulse 72   Ht '5\' 7"'$  (1.702 m)   Wt 134 lb (60.8 kg)   SpO2 98%   BMI 20.99 kg/m   Physical Exam Constitutional:      Appearance: Normal appearance.  Eyes:     General: No scleral  icterus. Neurological:     General: No focal deficit present.     Mental Status: She is alert.  Psychiatric:        Mood and Affect: Mood normal.        Behavior: Behavior normal.     ------------------------------------------------------------------------------------------------------------------------------------------------------------------------------------------------------------------- Assessment and Plan  Hypertension BP is well controlled at this time.  Continue current medications for management of HTN.   ADHD (attention deficit hyperactivity disorder) She continues to do well with methylphenidate at current strength and dosing.  We'll plan to continue and new rx sent in.  F/u in 6 months.    Meds ordered this encounter  Medications   methylphenidate (RITALIN) 10 MG tablet    Sig: Take 1 tablet (10 mg total) by mouth 3 (three) times daily with meals.    Dispense:  270 tablet    Refill:  0    No follow-ups on file.    This visit occurred during the SARS-CoV-2 public health emergency.  Safety protocols were in place, including screening questions prior to the visit, additional usage of staff PPE, and extensive cleaning of exam room while observing appropriate contact time as indicated for disinfecting solutions.

## 2022-08-08 NOTE — Assessment & Plan Note (Signed)
BP is well controlled at this time.  Continue current medications for management of HTN.

## 2022-08-29 ENCOUNTER — Encounter: Payer: Self-pay | Admitting: Family Medicine

## 2022-08-30 ENCOUNTER — Other Ambulatory Visit (HOSPITAL_COMMUNITY): Payer: Self-pay

## 2022-08-30 ENCOUNTER — Other Ambulatory Visit (HOSPITAL_BASED_OUTPATIENT_CLINIC_OR_DEPARTMENT_OTHER): Payer: Self-pay

## 2022-08-30 MED ORDER — TRETINOIN 0.1 % EX CREA
TOPICAL_CREAM | CUTANEOUS | 3 refills | Status: AC
  Filled 2022-08-30: qty 45, 30d supply, fill #0
  Filled 2023-05-11: qty 45, 30d supply, fill #1

## 2022-08-31 ENCOUNTER — Other Ambulatory Visit (HOSPITAL_BASED_OUTPATIENT_CLINIC_OR_DEPARTMENT_OTHER): Payer: Self-pay

## 2022-09-06 ENCOUNTER — Ambulatory Visit: Payer: 59 | Admitting: Neurology

## 2022-09-06 DIAGNOSIS — G43709 Chronic migraine without aura, not intractable, without status migrainosus: Secondary | ICD-10-CM

## 2022-09-06 DIAGNOSIS — G43009 Migraine without aura, not intractable, without status migrainosus: Secondary | ICD-10-CM

## 2022-09-06 MED ORDER — ONABOTULINUMTOXINA 200 UNITS IJ SOLR
155.0000 [IU] | Freq: Once | INTRAMUSCULAR | Status: AC
  Administered 2022-09-06: 155 [IU] via INTRAMUSCULAR

## 2022-09-06 NOTE — Progress Notes (Signed)
Botox- 200 units x 1 vial Lot: [id number] Expiration: 12/2024 NDC: 9597-4718-55  Bacteriostatic 0.9% Sodium Chloride- 61m total Lot: [id number] Expiration:06/20/2023 NDC: [address]-74 Dx: GV35.521  B/B

## 2022-09-06 NOTE — Progress Notes (Signed)
Consent Form Botulism Toxin Injection For Chronic Migraine   09/09/2022: stable doing amazing >> 50% improvement on botox in headache freq and severity and duraction! Now only 4 migraines a month will give nurtc for as needed, Only 4 migraine days a month and 4 total headache days a month. Failed imitrex, rizatriptan.  06/19/2022: stable doing amazing! Now only 4 migraines a month with give nurtec for as needed  03/27/2022: doing great, 4 migraine days a month and they are mild and easily treatable. The wether made it a bit worse these last few months but >> 90% improvement.  01/02/2022: first botox. Baseline: daily headaches and 15-20 migraine days a month.   Reviewed orally with patient, additionally signature is on file:  Botulism toxin has been approved by the Federal drug administration for treatment of chronic migraine. Botulism toxin does not cure chronic migraine and it may not be effective in some patients.  The administration of botulism toxin is accomplished by injecting a small amount of toxin into the muscles of the neck and head. Dosage must be titrated for each individual. Any benefits resulting from botulism toxin tend to wear off after 3 months with a repeat injection required if benefit is to be maintained. Injections are usually done every 3-4 months with maximum effect peak achieved by about 2 or 3 weeks. Botulism toxin is expensive and you should be sure of what costs you will incur resulting from the injection.  The side effects of botulism toxin use for chronic migraine may include:   -Transient, and usually mild, facial weakness with facial injections  -Transient, and usually mild, head or neck weakness with head/neck injections  -Reduction or loss of forehead facial animation due to forehead muscle weakness  -Eyelid drooping  -Dry eye  -Pain at the site of injection or bruising at the site of injection  -Double vision  -Potential unknown long term  risks  Contraindications: You should not have Botox if you are pregnant, nursing, allergic to albumin, have an infection, skin condition, or muscle weakness at the site of the injection, or have myasthenia gravis, Lambert-Eaton syndrome, or ALS.  It is also possible that as with any injection, there may be an allergic reaction or no effect from the medication. Reduced effectiveness after repeated injections is sometimes seen and rarely infection at the injection site may occur. All care will be taken to prevent these side effects. If therapy is given over a long time, atrophy and wasting in the muscle injected may occur. Occasionally the patient's become refractory to treatment because they develop antibodies to the toxin. In this event, therapy needs to be modified.  I have read the above information and consent to the administration of botulism toxin.    BOTOX PROCEDURE NOTE FOR MIGRAINE HEADACHE    Contraindications and precautions discussed with patient(above). Aseptic procedure was observed and patient tolerated procedure. Procedure performed by Dr. Georgia Dom  The condition has existed for more than 6 months, and pt does not have a diagnosis of ALS, Myasthenia Gravis or Lambert-Eaton Syndrome.  Risks and benefits of injections discussed and pt agrees to proceed with the procedure.  Written consent obtained  These injections are medically necessary. Pt  receives good benefits from these injections. These injections do not cause sedations or hallucinations which the oral therapies may cause.  Description of procedure:  The patient was placed in a sitting position. The standard protocol was used for Botox as follows, with 5 units of Botox injected at  each site:   -Procerus muscle, midline injection  -Corrugator muscle, bilateral injection  -Frontalis muscle, bilateral injection, with 2 sites each side, medial injection was performed in the upper one third of the frontalis muscle, in  the region vertical from the medial inferior edge of the superior orbital rim. The lateral injection was again in the upper one third of the forehead vertically above the lateral limbus of the cornea, 1.5 cm lateral to the medial injection site.  -Temporalis muscle injection, 4 sites, bilaterally. The first injection was 3 cm above the tragus of the ear, second injection site was 1.5 cm to 3 cm up from the first injection site in line with the tragus of the ear. The third injection site was 1.5-3 cm forward between the first 2 injection sites. The fourth injection site was 1.5 cm posterior to the second injection site.   -Occipitalis muscle injection, 3 sites, bilaterally. The first injection was done one half way between the occipital protuberance and the tip of the mastoid process behind the ear. The second injection site was done lateral and superior to the first, 1 fingerbreadth from the first injection. The third injection site was 1 fingerbreadth superiorly and medially from the first injection site.  -Cervical paraspinal muscle injection, 2 sites, bilateral knee first injection site was 1 cm from the midline of the cervical spine, 3 cm inferior to the lower border of the occipital protuberance. The second injection site was 1.5 cm superiorly and laterally to the first injection site.  -Trapezius muscle injection was performed at 3 sites, bilaterally. The first injection site was in the upper trapezius muscle halfway between the inflection point of the neck, and the acromion. The second injection site was one half way between the acromion and the first injection site. The third injection was done between the first injection site and the inflection point of the neck.   Will return for repeat injection in 3 months.   155 units of Botox was used, 45 Botox not injected was wasted. The patient tolerated the procedure well, there were no complications of the above procedure.

## 2022-09-09 ENCOUNTER — Other Ambulatory Visit (HOSPITAL_BASED_OUTPATIENT_CLINIC_OR_DEPARTMENT_OTHER): Payer: Self-pay

## 2022-09-09 ENCOUNTER — Other Ambulatory Visit: Payer: Self-pay | Admitting: Family Medicine

## 2022-09-09 MED ORDER — NURTEC 75 MG PO TBDP
75.0000 mg | ORAL_TABLET | Freq: Every day | ORAL | 12 refills | Status: DC | PRN
  Filled 2022-09-09: qty 16, 30d supply, fill #0
  Filled 2023-01-05: qty 16, 30d supply, fill #1

## 2022-09-10 ENCOUNTER — Other Ambulatory Visit (HOSPITAL_BASED_OUTPATIENT_CLINIC_OR_DEPARTMENT_OTHER): Payer: Self-pay

## 2022-09-10 MED ORDER — NORETHINDRONE-ETH ESTRADIOL 1-5 MG-MCG PO TABS
1.0000 | ORAL_TABLET | Freq: Every day | ORAL | 0 refills | Status: DC
  Filled 2022-09-10: qty 84, 84d supply, fill #0

## 2022-09-10 MED ORDER — SUMATRIPTAN SUCCINATE 100 MG PO TABS
100.0000 mg | ORAL_TABLET | Freq: Two times a day (BID) | ORAL | 0 refills | Status: DC | PRN
  Filled 2022-09-10: qty 24, 90d supply, fill #0
  Filled 2022-11-11: qty 9, 30d supply, fill #0
  Filled 2022-12-23: qty 9, 30d supply, fill #1
  Filled 2023-02-18: qty 6, 20d supply, fill #2
  Filled 2023-03-11: qty 6, 15d supply, fill #2

## 2022-09-11 ENCOUNTER — Other Ambulatory Visit (HOSPITAL_BASED_OUTPATIENT_CLINIC_OR_DEPARTMENT_OTHER): Payer: Self-pay

## 2022-10-14 ENCOUNTER — Other Ambulatory Visit (HOSPITAL_COMMUNITY): Payer: Self-pay

## 2022-10-15 ENCOUNTER — Other Ambulatory Visit (HOSPITAL_COMMUNITY): Payer: Self-pay

## 2022-10-16 ENCOUNTER — Other Ambulatory Visit (HOSPITAL_COMMUNITY): Payer: Self-pay

## 2022-10-19 ENCOUNTER — Other Ambulatory Visit (HOSPITAL_COMMUNITY): Payer: Self-pay

## 2022-10-19 ENCOUNTER — Other Ambulatory Visit (HOSPITAL_BASED_OUTPATIENT_CLINIC_OR_DEPARTMENT_OTHER): Payer: Self-pay

## 2022-10-19 ENCOUNTER — Encounter (HOSPITAL_COMMUNITY): Payer: Self-pay

## 2022-10-22 ENCOUNTER — Other Ambulatory Visit (HOSPITAL_COMMUNITY): Payer: Self-pay

## 2022-11-02 ENCOUNTER — Other Ambulatory Visit: Payer: Self-pay | Admitting: Family Medicine

## 2022-11-02 DIAGNOSIS — Z0000 Encounter for general adult medical examination without abnormal findings: Secondary | ICD-10-CM

## 2022-11-02 DIAGNOSIS — Z13220 Encounter for screening for lipoid disorders: Secondary | ICD-10-CM

## 2022-11-02 DIAGNOSIS — I10 Essential (primary) hypertension: Secondary | ICD-10-CM

## 2022-11-02 DIAGNOSIS — E611 Iron deficiency: Secondary | ICD-10-CM

## 2022-11-02 DIAGNOSIS — D75838 Other thrombocytosis: Secondary | ICD-10-CM

## 2022-11-06 LAB — CBC WITH DIFFERENTIAL/PLATELET
Absolute Monocytes: 583 cells/uL (ref 200–950)
Basophils Absolute: 58 cells/uL (ref 0–200)
Basophils Relative: 0.8 %
Eosinophils Absolute: 43 cells/uL (ref 15–500)
Eosinophils Relative: 0.6 %
HCT: 44.6 % (ref 35.0–45.0)
Hemoglobin: 15.1 g/dL (ref 11.7–15.5)
Lymphs Abs: 1886 cells/uL (ref 850–3900)
MCH: 30.8 pg (ref 27.0–33.0)
MCHC: 33.9 g/dL (ref 32.0–36.0)
MCV: 91 fL (ref 80.0–100.0)
MPV: 10.6 fL (ref 7.5–12.5)
Monocytes Relative: 8.1 %
Neutro Abs: 4630 cells/uL (ref 1500–7800)
Neutrophils Relative %: 64.3 %
Platelets: 437 10*3/uL — ABNORMAL HIGH (ref 140–400)
RBC: 4.9 10*6/uL (ref 3.80–5.10)
RDW: 12.8 % (ref 11.0–15.0)
Total Lymphocyte: 26.2 %
WBC: 7.2 10*3/uL (ref 3.8–10.8)

## 2022-11-06 LAB — COMPLETE METABOLIC PANEL WITH GFR
AG Ratio: 1.5 (calc) (ref 1.0–2.5)
ALT: 15 U/L (ref 6–29)
AST: 18 U/L (ref 10–35)
Albumin: 4.6 g/dL (ref 3.6–5.1)
Alkaline phosphatase (APISO): 62 U/L (ref 37–153)
BUN: 20 mg/dL (ref 7–25)
CO2: 27 mmol/L (ref 20–32)
Calcium: 9.9 mg/dL (ref 8.6–10.4)
Chloride: 101 mmol/L (ref 98–110)
Creat: 0.71 mg/dL (ref 0.50–1.05)
Globulin: 3.1 g/dL (calc) (ref 1.9–3.7)
Glucose, Bld: 79 mg/dL (ref 65–99)
Potassium: 4.2 mmol/L (ref 3.5–5.3)
Sodium: 138 mmol/L (ref 135–146)
Total Bilirubin: 0.5 mg/dL (ref 0.2–1.2)
Total Protein: 7.7 g/dL (ref 6.1–8.1)
eGFR: 94 mL/min/{1.73_m2} (ref >=60)

## 2022-11-06 LAB — LIPID PANEL W/REFLEX DIRECT LDL
Cholesterol: 185 mg/dL (ref <200)
HDL: 70 mg/dL (ref >=50)
LDL Cholesterol (Calc): 95 mg/dL (calc)
Non-HDL Cholesterol (Calc): 115 mg/dL (calc) (ref <130)
Total CHOL/HDL Ratio: 2.6 (calc) (ref <5.0)
Triglycerides: 103 mg/dL (ref <150)

## 2022-11-06 LAB — TSH: TSH: 2.21 mIU/L (ref 0.40–4.50)

## 2022-11-06 LAB — IRON,TIBC AND FERRITIN PANEL
%SAT: 40 % (calc) (ref 16–45)
Ferritin: 20 ng/mL (ref 16–288)
Iron: 191 ug/dL — ABNORMAL HIGH (ref 45–160)
TIBC: 473 mcg/dL (calc) — ABNORMAL HIGH (ref 250–450)

## 2022-11-06 LAB — T3, FREE: T3, Free: 2.4 pg/mL (ref 2.3–4.2)

## 2022-11-06 LAB — T3, REVERSE: T3, Reverse: 16 ng/dL (ref 8–25)

## 2022-11-06 LAB — VITAMIN D 25 HYDROXY (VIT D DEFICIENCY, FRACTURES): Vit D, 25-Hydroxy: 44 ng/mL (ref 30–100)

## 2022-11-06 LAB — T4, FREE: Free T4: 1 ng/dL (ref 0.8–1.8)

## 2022-11-09 DIAGNOSIS — Z1231 Encounter for screening mammogram for malignant neoplasm of breast: Secondary | ICD-10-CM | POA: Diagnosis not present

## 2022-11-09 DIAGNOSIS — R92323 Mammographic fibroglandular density, bilateral breasts: Secondary | ICD-10-CM | POA: Diagnosis not present

## 2022-11-09 LAB — HM MAMMOGRAPHY

## 2022-11-11 ENCOUNTER — Other Ambulatory Visit: Payer: Self-pay | Admitting: Family Medicine

## 2022-11-13 ENCOUNTER — Other Ambulatory Visit: Payer: Self-pay

## 2022-11-13 ENCOUNTER — Other Ambulatory Visit (HOSPITAL_BASED_OUTPATIENT_CLINIC_OR_DEPARTMENT_OTHER): Payer: Self-pay

## 2022-11-13 MED ORDER — ONDANSETRON 4 MG PO TBDP
4.0000 mg | ORAL_TABLET | Freq: Three times a day (TID) | ORAL | 6 refills | Status: AC | PRN
  Filled 2022-11-13: qty 20, 7d supply, fill #0
  Filled 2023-02-05: qty 20, 7d supply, fill #1
  Filled 2023-06-13: qty 20, 7d supply, fill #2

## 2022-11-14 ENCOUNTER — Other Ambulatory Visit (HOSPITAL_BASED_OUTPATIENT_CLINIC_OR_DEPARTMENT_OTHER): Payer: Self-pay

## 2022-11-14 ENCOUNTER — Other Ambulatory Visit: Payer: Self-pay

## 2022-11-14 DIAGNOSIS — L820 Inflamed seborrheic keratosis: Secondary | ICD-10-CM | POA: Diagnosis not present

## 2022-11-14 DIAGNOSIS — D1801 Hemangioma of skin and subcutaneous tissue: Secondary | ICD-10-CM | POA: Diagnosis not present

## 2022-11-14 DIAGNOSIS — L821 Other seborrheic keratosis: Secondary | ICD-10-CM | POA: Diagnosis not present

## 2022-11-14 DIAGNOSIS — L404 Guttate psoriasis: Secondary | ICD-10-CM | POA: Diagnosis not present

## 2022-11-14 DIAGNOSIS — D2261 Melanocytic nevi of right upper limb, including shoulder: Secondary | ICD-10-CM | POA: Diagnosis not present

## 2022-11-14 DIAGNOSIS — L570 Actinic keratosis: Secondary | ICD-10-CM | POA: Diagnosis not present

## 2022-11-14 DIAGNOSIS — Z85820 Personal history of malignant melanoma of skin: Secondary | ICD-10-CM | POA: Diagnosis not present

## 2022-11-14 DIAGNOSIS — D225 Melanocytic nevi of trunk: Secondary | ICD-10-CM | POA: Diagnosis not present

## 2022-11-14 MED ORDER — TRIAMCINOLONE ACETONIDE 0.1 % EX CREA
TOPICAL_CREAM | CUTANEOUS | 0 refills | Status: DC
  Filled 2022-11-14: qty 454, 30d supply, fill #0

## 2022-11-21 ENCOUNTER — Other Ambulatory Visit (HOSPITAL_BASED_OUTPATIENT_CLINIC_OR_DEPARTMENT_OTHER): Payer: Self-pay

## 2022-11-28 ENCOUNTER — Telehealth: Payer: Self-pay | Admitting: Neurology

## 2022-11-28 ENCOUNTER — Other Ambulatory Visit (HOSPITAL_COMMUNITY): Payer: Self-pay

## 2022-11-28 DIAGNOSIS — G43709 Chronic migraine without aura, not intractable, without status migrainosus: Secondary | ICD-10-CM

## 2022-11-28 NOTE — Telephone Encounter (Signed)
BotoxOne-Benefit Verification [id number] Submitted!

## 2022-11-28 NOTE — Telephone Encounter (Signed)
Pharmacy Patient Advocate Encounter   Received notification from [facility] that prior authorization for Botox 200UNIT solution is required/requested.    PA submitted on 11/28/2022 to (ins) [employer] via CoverMyMeds Key [id number] Status is pending

## 2022-11-28 NOTE — Telephone Encounter (Signed)
Called pt. Left a vm message to please call office.

## 2022-11-28 NOTE — Telephone Encounter (Signed)
She needs a botox PA with her new insurance. Scheduled for Monday

## 2022-11-28 NOTE — Telephone Encounter (Addendum)
Chronic Migraine CPT 64615  Botox J0585 Units:200  G43.709 Chronic Migraine without aura, not intractable, without status migrainous  Need authorization with new insurance please.  Can someone from [facility] call patient ASAP and ask her for her insurance card information? Ideally we need a copy of the front and back but if she can even provide the name of her insurance, the ID number, group number, and then her pharmacy benefit information (Rx ID, BIN, PCN, Group numbers) and the prior-authorization line for providers to call (from back of card) that would be great.

## 2022-11-29 NOTE — Telephone Encounter (Addendum)
Haven't received Botox approval for patient .  Spoke to patient rescheduled botox appointment to 01/16/2023 . Offered Pt a sooner appointment with NP due to Dr Jaynee Eagles not able to get her in until March. Pt refused sooner appointment  with NP

## 2022-11-30 ENCOUNTER — Other Ambulatory Visit (HOSPITAL_COMMUNITY): Payer: Self-pay

## 2022-11-30 ENCOUNTER — Other Ambulatory Visit (HOSPITAL_BASED_OUTPATIENT_CLINIC_OR_DEPARTMENT_OTHER): Payer: Self-pay

## 2022-11-30 ENCOUNTER — Ambulatory Visit (INDEPENDENT_AMBULATORY_CARE_PROVIDER_SITE_OTHER): Payer: Commercial Managed Care - PPO | Admitting: Family Medicine

## 2022-11-30 DIAGNOSIS — Z23 Encounter for immunization: Secondary | ICD-10-CM | POA: Diagnosis not present

## 2022-11-30 DIAGNOSIS — Z0000 Encounter for general adult medical examination without abnormal findings: Secondary | ICD-10-CM

## 2022-11-30 MED ORDER — CLINDAMYCIN PHOSPHATE 1 % EX SOLN
Freq: Two times a day (BID) | CUTANEOUS | 6 refills | Status: DC
  Filled 2022-11-30 – 2023-02-18 (×2): qty 60, 30d supply, fill #0
  Filled 2023-06-13: qty 60, 30d supply, fill #1
  Filled 2023-10-06: qty 60, 30d supply, fill #2

## 2022-11-30 NOTE — Telephone Encounter (Signed)
Pharmacy Patient Advocate Encounter  Prior Authorization for Botox 100UNIT solution has been approved.    PA# PA Case ID: [id number] Effective dates: 11/29/2022 through 11/29/2023  This is S/P and can be filled at [facility]  Per test billing the copay is $4

## 2022-11-30 NOTE — Patient Instructions (Signed)
Preventive Care 65 Years and Older, Female Preventive care refers to lifestyle choices and visits with your health care provider that can promote health and wellness. Preventive care visits are also called wellness exams. What can I expect for my preventive care visit? Counseling Your health care provider may ask you questions about your: Medical history, including: Past medical problems. Family medical history. Pregnancy and menstrual history. History of falls. Current health, including: Memory and ability to understand (cognition). Emotional well-being. Home life and relationship well-being. Sexual activity and sexual health. Lifestyle, including: Alcohol, nicotine or tobacco, and drug use. Access to firearms. Diet, exercise, and sleep habits. Work and work environment. Sunscreen use. Safety issues such as seatbelt and bike helmet use. Physical exam Your health care provider will check your: Height and weight. These may be used to calculate your BMI (body mass index). BMI is a measurement that tells if you are at a healthy weight. Waist circumference. This measures the distance around your waistline. This measurement also tells if you are at a healthy weight and may help predict your risk of certain diseases, such as type 2 diabetes and high blood pressure. Heart rate and blood pressure. Body temperature. Skin for abnormal spots. What immunizations do I need?  Vaccines are usually given at various ages, according to a schedule. Your health care provider will recommend vaccines for you based on your age, medical history, and lifestyle or other factors, such as travel or where you work. What tests do I need? Screening Your health care provider may recommend screening tests for certain conditions. This may include: Lipid and cholesterol levels. Hepatitis C test. Hepatitis B test. HIV (human immunodeficiency virus) test. STI (sexually transmitted infection) testing, if you are at  risk. Lung cancer screening. Colorectal cancer screening. Diabetes screening. This is done by checking your blood sugar (glucose) after you have not eaten for a while (fasting). Mammogram. Talk with your health care provider about how often you should have regular mammograms. BRCA-related cancer screening. This may be done if you have a family history of breast, ovarian, tubal, or peritoneal cancers. Bone density scan. This is done to screen for osteoporosis. Talk with your health care provider about your test results, treatment options, and if necessary, the need for more tests. Follow these instructions at home: Eating and drinking  Eat a diet that includes fresh fruits and vegetables, whole grains, lean protein, and low-fat dairy products. Limit your intake of foods with high amounts of sugar, saturated fats, and salt. Take vitamin and mineral supplements as recommended by your health care provider. Do not drink alcohol if your health care provider tells you not to drink. If you drink alcohol: Limit how much you have to 0-1 drink a day. Know how much alcohol is in your drink. In the [place] one drink equals one 12 oz bottle of beer (355 mL), one 5 oz glass of wine (148 mL), or one 1 oz glass of hard liquor (44 mL). Lifestyle Brush your teeth every morning and night with fluoride toothpaste. Floss one time each day. Exercise for at least 30 minutes 5 or more days each week. Do not use any products that contain nicotine or tobacco. These products include cigarettes, chewing tobacco, and vaping devices, such as e-cigarettes. If you need help quitting, ask your health care provider. Do not use drugs. If you are sexually active, practice safe sex. Use a condom or other form of protection in order to prevent STIs. Take aspirin only as told by   your health care provider. Make sure that you understand how much to take and what form to take. Work with your health care provider to find out whether it  is safe and beneficial for you to take aspirin daily. Ask your health care provider if you need to take a cholesterol-lowering medicine (statin). Find healthy ways to manage stress, such as: Meditation, yoga, or listening to music. Journaling. Talking to a trusted person. Spending time with friends and family. Minimize exposure to UV radiation to reduce your risk of skin cancer. Safety Always wear your seat belt while driving or riding in a vehicle. Do not drive: If you have been drinking alcohol. Do not ride with someone who has been drinking. When you are tired or distracted. While texting. If you have been using any mind-altering substances or drugs. Wear a helmet and other protective equipment during sports activities. If you have firearms in your house, make sure you follow all gun safety procedures. What's next? Visit your health care provider once a year for an annual wellness visit. Ask your health care provider how often you should have your eyes and teeth checked. Stay up to date on all vaccines. This information is not intended to replace advice given to you by your health care provider. Make sure you discuss any questions you have with your health care provider. Document Revised: 05/03/2021 Document Reviewed: 05/03/2021 Elsevier Patient Education  2023 Elsevier Inc.  

## 2022-12-02 ENCOUNTER — Encounter: Payer: Self-pay | Admitting: Family Medicine

## 2022-12-02 NOTE — Assessment & Plan Note (Signed)
Well adult Recent labs reviewed with her. Orders Placed This Encounter  Procedures   Tdap vaccine greater than or equal to 67yo IM   Pneumococcal conjugate vaccine 20-valent (Prevnar 20)  Screenings: Up-to-date Immunizations: Per orders Anticipatory guidance/risk factor reduction: Recommendations per AVS.

## 2022-12-02 NOTE — Progress Notes (Signed)
Deborah York - 67 y.o. 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  She reports she is doing quite well at this time.  She denies any significant changes in her health since last visit.  She continues to stay pretty active and feels like diet is going pretty well.  She is doing well with current medications.  No issues with tolerance.  She does continue to see neurology for her migraines.  She has GYN for well woman exam.  Colon cancer screening and mammogram are up-to-date.  She would like to have Prevnar 20 as well as updated Tdap today.  Review of Systems  Constitutional:  Negative for chills, fever, malaise/fatigue and weight loss.  HENT:  Negative for congestion, ear pain and sore throat.   Eyes:  Negative for blurred vision, double vision and pain.  Respiratory:  Negative for cough and shortness of breath.   Cardiovascular:  Negative for chest pain and palpitations.  Gastrointestinal:  Negative for abdominal pain, blood in stool, constipation, heartburn and nausea.  Genitourinary:  Negative for dysuria and urgency.  Musculoskeletal:  Negative for joint pain and myalgias.  Neurological:  Negative for dizziness and headaches.  Endo/Heme/Allergies:  Does not bruise/bleed easily.  Psychiatric/Behavioral:  Negative for depression. The patient is not nervous/anxious and does not have insomnia.      Allergies  Allergen Reactions   Sulfa Antibiotics Rash    Past Medical History:  Diagnosis Date   Complication of anesthesia    Hypertension    Migraines    PONV (postoperative nausea and vomiting)     Past Surgical History:  Procedure Laterality Date   IRRIGATION AND DEBRIDEMENT SHOULDER Right 03/23/2021   Procedure: IRRIGATION AND DEBRIDEMENT SHOULDER;  Surgeon: [name], MD;  Location: [facility];  Service:  Orthopedics;  Laterality: Right;   LYSIS OF ADHESION Right 03/23/2021   Procedure: LYSIS OF ADHESION WITH MANIPULATION;  Surgeon: [name], MD;  Location: [facility];  Service: Orthopedics;  Laterality: Right;   SHOULDER ACROMIOPLASTY Right 03/23/2021   Procedure: SHOULDER ACROMIOPLASTY;  Surgeon: [name], MD;  Location: [facility];  Service: Orthopedics;  Laterality: Right;   SHOULDER ARTHROSCOPY WITH DISTAL CLAVICLE RESECTION Right 03/23/2021   Procedure: SHOULDER ARTHROSCOPY WITH DISTAL CLAVICLE RESECTION;  Surgeon: [name], MD;  Location: [facility];  Service: Orthopedics;  Laterality: Right;   TOE SURGERY      Social History   Socioeconomic History   Marital status: Married    Spouse name: Not on file   Number of children: Not on file   Years of education: Not on file   Highest education level: Not on file  Occupational History   Not on file  Tobacco Use   Smoking status: Former   Smokeless tobacco: Former  Scientific laboratory technician Use: Never used  Substance and Sexual Activity   Alcohol use: Not Currently   Drug use: Never   Sexual activity: Not on file  Other Topics Concern   Not on file  Social History Narrative   Caffeine- one cup daily, diet coke daily.  Education: Family MD (healthy weight wellness).      Social Determinants of Health   Financial Resource Strain: Not on file  Food Insecurity: Not on file  Transportation Needs:  Not on file  Physical Activity: Not on file  Stress: Not on file  Social Connections: Not on file    Family History  Problem Relation Age of Onset   Cancer Mother    Migraines Mother    Hypertension Mother    Heart failure Father    Migraines Maternal Grandmother    Migraines Other     Health Maintenance  Topic Date Due   COVID-19 Vaccine (6 - 2023-24 season) 10/29/2022   Hepatitis C Screening  08/09/2023 (Originally 06/11/1974)   MAMMOGRAM  08/26/2023   COLONOSCOPY (Pts  45-68yr Insurance coverage will need to be confirmed)  02/18/2031   DTaP/Tdap/Td (3 - Td or Tdap) 11/30/2032   Pneumonia Vaccine 67 Years old  Completed   INFLUENZA VACCINE  Completed   DEXA SCAN  Completed   Zoster Vaccines- Shingrix  Completed   HPV VACCINES  Aged Out     ----------------------------------------------------------------------------------------------------------------------------------------------------------------------------------------------------------------- Physical Exam There were no vitals taken for this visit.  Physical Exam Constitutional:      General: She is not in acute distress. HENT:     Head: Normocephalic and atraumatic.     Right Ear: Tympanic membrane and ear canal normal.     Left Ear: Tympanic membrane and ear canal normal.     Nose: Nose normal.  Eyes:     General: No scleral icterus.    Conjunctiva/sclera: Conjunctivae normal.  Neck:     Thyroid: No thyromegaly.  Cardiovascular:     Rate and Rhythm: Normal rate and regular rhythm.     Heart sounds: Normal heart sounds.  Pulmonary:     Effort: Pulmonary effort is normal.     Breath sounds: Normal breath sounds.  Abdominal:     General: Bowel sounds are normal. There is no distension.     Palpations: Abdomen is soft.     Tenderness: There is no abdominal tenderness. There is no guarding.  Musculoskeletal:        General: Normal range of motion.     Cervical back: Normal range of motion and neck supple.  Lymphadenopathy:     Cervical: No cervical adenopathy.  Skin:    General: Skin is warm and dry.     Findings: No rash.  Neurological:     General: No focal deficit present.     Mental Status: She is alert and oriented to person, place, and time.     Cranial Nerves: No cranial nerve deficit.     Coordination: Coordination normal.  Psychiatric:        Mood and Affect: Mood normal.        Behavior: Behavior normal.      ------------------------------------------------------------------------------------------------------------------------------------------------------------------------------------------------------------------- Assessment and Plan  Well adult exam Well adult Recent labs reviewed with her. Orders Placed This Encounter  Procedures   Tdap vaccine greater than or equal to 7yo IM   Pneumococcal conjugate vaccine 20-valent (Prevnar 20)  Screenings: Up-to-date Immunizations: Per orders Anticipatory guidance/risk factor reduction: Recommendations per AVS.   Meds ordered this encounter  Medications   clindamycin (CLEOCIN T) 1 % external solution    Sig: Apply topically 2 (two) times daily.    Dispense:  60 mL    Refill:  6    No follow-ups on file.    This visit occurred during the SARS-CoV-2 public health emergency.  Safety protocols were in place, including screening questions prior to the visit, additional usage of staff PPE, and extensive cleaning of exam room while observing appropriate contact time as  indicated for disinfecting solutions.

## 2022-12-03 ENCOUNTER — Ambulatory Visit: Payer: 59 | Admitting: Neurology

## 2022-12-03 ENCOUNTER — Telehealth: Payer: Self-pay | Admitting: Neurology

## 2022-12-03 ENCOUNTER — Other Ambulatory Visit (HOSPITAL_COMMUNITY): Payer: Self-pay

## 2022-12-03 NOTE — Telephone Encounter (Signed)
Ok see other phone note. There is a new Botox approval on file.

## 2022-12-03 NOTE — Telephone Encounter (Signed)
The current PA was submitted to [employer].

## 2022-12-03 NOTE — Telephone Encounter (Signed)
Wonderful, thanks!

## 2022-12-03 NOTE — Telephone Encounter (Signed)
Pt has called to provide her new Insurance info re: upcoming Botox appointment [employer] TC:[id number] Group# [id number] Issuer#([id number]) [id number] Provider Line [phone]

## 2022-12-07 ENCOUNTER — Other Ambulatory Visit (HOSPITAL_BASED_OUTPATIENT_CLINIC_OR_DEPARTMENT_OTHER): Payer: Self-pay

## 2022-12-09 ENCOUNTER — Other Ambulatory Visit: Payer: Self-pay | Admitting: Family Medicine

## 2022-12-09 ENCOUNTER — Other Ambulatory Visit (HOSPITAL_BASED_OUTPATIENT_CLINIC_OR_DEPARTMENT_OTHER): Payer: Self-pay

## 2022-12-09 DIAGNOSIS — F900 Attention-deficit hyperactivity disorder, predominantly inattentive type: Secondary | ICD-10-CM

## 2022-12-10 ENCOUNTER — Other Ambulatory Visit: Payer: Self-pay | Admitting: Family Medicine

## 2022-12-10 ENCOUNTER — Other Ambulatory Visit (HOSPITAL_BASED_OUTPATIENT_CLINIC_OR_DEPARTMENT_OTHER): Payer: Self-pay

## 2022-12-10 MED ORDER — TELMISARTAN 20 MG PO TABS
20.0000 mg | ORAL_TABLET | Freq: Every day | ORAL | 3 refills | Status: DC
  Filled 2022-12-10: qty 90, 90d supply, fill #0
  Filled 2023-03-11: qty 90, 90d supply, fill #1
  Filled 2023-06-08: qty 90, 90d supply, fill #2

## 2022-12-10 MED ORDER — NORETHINDRONE-ETH ESTRADIOL 1-5 MG-MCG PO TABS
1.0000 | ORAL_TABLET | Freq: Every day | ORAL | 0 refills | Status: DC
  Filled 2022-12-10: qty 84, 84d supply, fill #0

## 2022-12-10 MED ORDER — METHYLPHENIDATE HCL 10 MG PO TABS
10.0000 mg | ORAL_TABLET | Freq: Three times a day (TID) | ORAL | 0 refills | Status: DC
  Filled 2022-12-10: qty 270, 90d supply, fill #0

## 2022-12-13 ENCOUNTER — Encounter: Payer: Self-pay | Admitting: Family Medicine

## 2022-12-28 ENCOUNTER — Other Ambulatory Visit (HOSPITAL_BASED_OUTPATIENT_CLINIC_OR_DEPARTMENT_OTHER): Payer: Self-pay

## 2022-12-28 MED ORDER — HYDROCODONE-ACETAMINOPHEN 5-325 MG PO TABS
1.0000 | ORAL_TABLET | ORAL | 0 refills | Status: DC
  Filled 2022-12-28: qty 10, 2d supply, fill #0

## 2022-12-28 MED ORDER — ALPRAZOLAM 0.5 MG PO TABS
0.5000 mg | ORAL_TABLET | ORAL | 0 refills | Status: DC
  Filled 2022-12-28: qty 3, 3d supply, fill #0

## 2023-01-07 ENCOUNTER — Other Ambulatory Visit (HOSPITAL_BASED_OUTPATIENT_CLINIC_OR_DEPARTMENT_OTHER): Payer: Self-pay

## 2023-01-08 ENCOUNTER — Other Ambulatory Visit (HOSPITAL_BASED_OUTPATIENT_CLINIC_OR_DEPARTMENT_OTHER): Payer: Self-pay

## 2023-01-10 ENCOUNTER — Other Ambulatory Visit: Payer: Self-pay

## 2023-01-10 ENCOUNTER — Other Ambulatory Visit (HOSPITAL_COMMUNITY): Payer: Self-pay

## 2023-01-10 MED ORDER — ONABOTULINUMTOXINA 200 UNITS IJ SOLR
INTRAMUSCULAR | 3 refills | Status: DC
  Filled 2023-01-10 – 2023-02-07 (×4): qty 1, 84d supply, fill #0
  Filled 2023-03-27 – 2023-04-09 (×2): qty 1, 84d supply, fill #1
  Filled 2023-07-09: qty 1, 84d supply, fill #2
  Filled 2023-10-16: qty 1, 84d supply, fill #3

## 2023-01-10 NOTE — Addendum Note (Signed)
Addended by: Gildardo Griffes on: 01/10/2023 03:35 PM   Modules accepted: Orders

## 2023-01-10 NOTE — Telephone Encounter (Signed)
Botox 200 unit Rx sent to [facility].

## 2023-01-10 NOTE — Telephone Encounter (Signed)
Can you send rx to [facility] asap please?

## 2023-01-11 ENCOUNTER — Other Ambulatory Visit (HOSPITAL_COMMUNITY): Payer: Self-pay

## 2023-01-14 ENCOUNTER — Other Ambulatory Visit (HOSPITAL_COMMUNITY): Payer: Self-pay

## 2023-01-16 ENCOUNTER — Other Ambulatory Visit: Payer: Self-pay

## 2023-01-16 ENCOUNTER — Other Ambulatory Visit (HOSPITAL_COMMUNITY): Payer: Self-pay

## 2023-01-16 ENCOUNTER — Encounter: Payer: Self-pay | Admitting: Neurology

## 2023-01-16 ENCOUNTER — Ambulatory Visit: Payer: Commercial Managed Care - PPO | Admitting: Neurology

## 2023-01-16 DIAGNOSIS — G43709 Chronic migraine without aura, not intractable, without status migrainosus: Secondary | ICD-10-CM | POA: Diagnosis not present

## 2023-01-16 DIAGNOSIS — G43009 Migraine without aura, not intractable, without status migrainosus: Secondary | ICD-10-CM

## 2023-01-16 MED ORDER — UBRELVY 100 MG PO TABS: 100.0000 mg | ORAL_TABLET | ORAL | 0 refills | Status: DC | PRN

## 2023-01-16 MED ORDER — ONABOTULINUMTOXINA 200 UNITS IJ SOLR
155.0000 [IU] | Freq: Once | INTRAMUSCULAR | Status: AC
  Administered 2023-01-16: 155 [IU] via INTRAMUSCULAR

## 2023-01-16 MED ORDER — NURTEC 75 MG PO TBDP: 75.0000 mg | ORAL_TABLET | Freq: Every day | ORAL | 12 refills | Status: DC | PRN

## 2023-01-16 NOTE — Progress Notes (Signed)
Botox- 200 units x 1 vial Lot: [id number] Expiration: 04/2025 NDC: [id number]  Bacteriostatic 0.9% Sodium Chloride- 54m total Lot: [id number]: 09/2024 NDC: [id number] Dx: [id number]

## 2023-01-16 NOTE — Progress Notes (Signed)
Consent Form Botulism Toxin Injection For Chronic Migraine   01/16/2023: doing well. >> 50% improvement on botox in headache freq and severity and duraction! Now only 4-5 migraines a month will give nurtc for as needed, Only 4-5 migraine days a month and <6 total headache days a month. Failed imitrex, rizatriptan.  Meds ordered this encounter  Medications   botulinum toxin Type A (BOTOX) injection 155 Units    Botox- 200 units x 1 vial Lot: [id number] Expiration: 04/2025 NDC: [id number]  Bacteriostatic 0.9% Sodium Chloride- 59m total Lot: [id number]: 09/2024 NDC: [id number] Dx: [id number]   DISCONTD: Rimegepant Sulfate (NURTEC) 75 MG TBDP    Sig: Take 1 tablet (75 mg total) by mouth daily as needed. For migraines. Take as close to onset of migraine as possible. One daily maximum.    Dispense:  16 tablet    Refill:  12    4-5 migraines a month and < 6 total headache days a month. will give nurtc for as needed, Failed imitrex, rizatriptan.   Rimegepant Sulfate (NURTEC) 75 MG TBDP    Sig: Take 1 tablet (75 mg total) by mouth daily as needed. For migraines. Take as close to onset of migraine as possible. One daily maximum. 4-5 migraines a month and < 6 total headache days a month. will give nurtc for as needed, Failed imitrex, rizatriptan AND ubrelvy    Dispense:  16 tablet    Refill:  12    4-5 migraines a month and < 6 total headache days a month. will give nurtc for as needed, Failed imitrex, rizatriptan AND ubrelvy   Ubrogepant (UBRELVY) 100 MG TABS    Sig: Take 1 tablet (100 mg total) by mouth every 2 (two) hours as needed. Maximum '200mg'$  a day.    Dispense:  10 tablet    Refill:  0    09/09/2022: stable doing amazing >> 50% improvement on botox in headache freq and severity and duraction! Now only 4 migraines a month will give nurtc for as needed, Only 4 migraine days a month and 4 total headache days a month. Failed imitrex, rizatriptan.  06/19/2022: stable doing  amazing! Now only 4 migraines a month with give nurtec for as needed  03/27/2022: doing great, 4 migraine days a month and they are mild and easily treatable. The wether made it a bit worse these last few months but >> 90% improvement.  01/02/2022: first botox. Baseline: daily headaches and 15-20 migraine days a month.   Reviewed orally with patient, additionally signature is on file:  Botulism toxin has been approved by the Federal drug administration for treatment of chronic migraine. Botulism toxin does not cure chronic migraine and it may not be effective in some patients.  The administration of botulism toxin is accomplished by injecting a small amount of toxin into the muscles of the neck and head. Dosage must be titrated for each individual. Any benefits resulting from botulism toxin tend to wear off after 3 months with a repeat injection required if benefit is to be maintained. Injections are usually done every 3-4 months with maximum effect peak achieved by about 2 or 3 weeks. Botulism toxin is expensive and you should be sure of what costs you will incur resulting from the injection.  The side effects of botulism toxin use for chronic migraine may include:   -Transient, and usually mild, facial weakness with facial injections  -Transient, and usually mild, head or neck weakness with head/neck injections  -Reduction or  loss of forehead facial animation due to forehead muscle weakness  -Eyelid drooping  -Dry eye  -Pain at the site of injection or bruising at the site of injection  -Double vision  -Potential unknown long term risks  Contraindications: You should not have Botox if you are pregnant, nursing, allergic to albumin, have an infection, skin condition, or muscle weakness at the site of the injection, or have myasthenia gravis, Lambert-Eaton syndrome, or ALS.  It is also possible that as with any injection, there may be an allergic reaction or no effect from the medication.  Reduced effectiveness after repeated injections is sometimes seen and rarely infection at the injection site may occur. All care will be taken to prevent these side effects. If therapy is given over a long time, atrophy and wasting in the muscle injected may occur. Occasionally the patient's become refractory to treatment because they develop antibodies to the toxin. In this event, therapy needs to be modified.  I have read the above information and consent to the administration of botulism toxin.    BOTOX PROCEDURE NOTE FOR MIGRAINE HEADACHE    Contraindications and precautions discussed with patient(above). Aseptic procedure was observed and patient tolerated procedure. Procedure performed by Dr. Georgia Dom  The condition has existed for more than 6 months, and pt does not have a diagnosis of ALS, Myasthenia Gravis or Lambert-Eaton Syndrome.  Risks and benefits of injections discussed and pt agrees to proceed with the procedure.  Written consent obtained  These injections are medically necessary. Pt  receives good benefits from these injections. These injections do not cause sedations or hallucinations which the oral therapies may cause.  Description of procedure:  The patient was placed in a sitting position. The standard protocol was used for Botox as follows, with 5 units of Botox injected at each site:   -Procerus muscle, midline injection  -Corrugator muscle, bilateral injection  -Frontalis muscle, bilateral injection, with 2 sites each side, medial injection was performed in the upper one third of the frontalis muscle, in the region vertical from the medial inferior edge of the superior orbital rim. The lateral injection was again in the upper one third of the forehead vertically above the lateral limbus of the cornea, 1.5 cm lateral to the medial injection site.  -Temporalis muscle injection, 4 sites, bilaterally. The first injection was 3 cm above the tragus of the ear, second  injection site was 1.5 cm to 3 cm up from the first injection site in line with the tragus of the ear. The third injection site was 1.5-3 cm forward between the first 2 injection sites. The fourth injection site was 1.5 cm posterior to the second injection site.   -Occipitalis muscle injection, 3 sites, bilaterally. The first injection was done one half way between the occipital protuberance and the tip of the mastoid process behind the ear. The second injection site was done lateral and superior to the first, 1 fingerbreadth from the first injection. The third injection site was 1 fingerbreadth superiorly and medially from the first injection site.  -Cervical paraspinal muscle injection, 2 sites, bilateral knee first injection site was 1 cm from the midline of the cervical spine, 3 cm inferior to the lower border of the occipital protuberance. The second injection site was 1.5 cm superiorly and laterally to the first injection site.  -Trapezius muscle injection was performed at 3 sites, bilaterally. The first injection site was in the upper trapezius muscle halfway between the inflection point of the neck,  and the acromion. The second injection site was one half way between the acromion and the first injection site. The third injection was done between the first injection site and the inflection point of the neck.   Will return for repeat injection in 3 months.   155 units of Botox was used, 45 Botox not injected was wasted. The patient tolerated the procedure well, there were no complications of the above procedure.

## 2023-01-21 ENCOUNTER — Other Ambulatory Visit: Payer: Self-pay

## 2023-01-22 ENCOUNTER — Other Ambulatory Visit (HOSPITAL_COMMUNITY): Payer: Self-pay

## 2023-01-24 ENCOUNTER — Other Ambulatory Visit (HOSPITAL_COMMUNITY): Payer: Self-pay

## 2023-01-25 ENCOUNTER — Other Ambulatory Visit (HOSPITAL_BASED_OUTPATIENT_CLINIC_OR_DEPARTMENT_OTHER): Payer: Self-pay

## 2023-02-05 ENCOUNTER — Other Ambulatory Visit (HOSPITAL_BASED_OUTPATIENT_CLINIC_OR_DEPARTMENT_OTHER): Payer: Self-pay

## 2023-02-05 ENCOUNTER — Other Ambulatory Visit: Payer: Self-pay | Admitting: Family Medicine

## 2023-02-05 MED ORDER — SPIRONOLACTONE 100 MG PO TABS
100.0000 mg | ORAL_TABLET | Freq: Every day | ORAL | 3 refills | Status: DC
  Filled 2023-02-05: qty 90, 90d supply, fill #0
  Filled 2023-08-07: qty 90, 90d supply, fill #1
  Filled 2023-12-06: qty 90, 90d supply, fill #2

## 2023-02-06 ENCOUNTER — Other Ambulatory Visit: Payer: Self-pay

## 2023-02-07 ENCOUNTER — Other Ambulatory Visit (HOSPITAL_COMMUNITY): Payer: Self-pay

## 2023-02-07 ENCOUNTER — Other Ambulatory Visit: Payer: Self-pay

## 2023-02-18 ENCOUNTER — Other Ambulatory Visit (HOSPITAL_BASED_OUTPATIENT_CLINIC_OR_DEPARTMENT_OTHER): Payer: Self-pay

## 2023-02-19 ENCOUNTER — Other Ambulatory Visit: Payer: Self-pay | Admitting: Family Medicine

## 2023-02-19 ENCOUNTER — Other Ambulatory Visit (HOSPITAL_BASED_OUTPATIENT_CLINIC_OR_DEPARTMENT_OTHER): Payer: Self-pay

## 2023-02-21 ENCOUNTER — Other Ambulatory Visit (HOSPITAL_BASED_OUTPATIENT_CLINIC_OR_DEPARTMENT_OTHER): Payer: Self-pay

## 2023-02-21 NOTE — Telephone Encounter (Signed)
Patient informed and will contact her neurologist. She states her current neurologist has never filled this medication  but she  will request from them .

## 2023-03-08 ENCOUNTER — Other Ambulatory Visit (HOSPITAL_BASED_OUTPATIENT_CLINIC_OR_DEPARTMENT_OTHER): Payer: Self-pay

## 2023-03-11 ENCOUNTER — Other Ambulatory Visit: Payer: Self-pay | Admitting: Neurology

## 2023-03-11 ENCOUNTER — Other Ambulatory Visit: Payer: Self-pay | Admitting: Family Medicine

## 2023-03-11 DIAGNOSIS — F900 Attention-deficit hyperactivity disorder, predominantly inattentive type: Secondary | ICD-10-CM

## 2023-03-12 ENCOUNTER — Other Ambulatory Visit: Payer: Self-pay

## 2023-03-12 ENCOUNTER — Other Ambulatory Visit (HOSPITAL_BASED_OUTPATIENT_CLINIC_OR_DEPARTMENT_OTHER): Payer: Self-pay

## 2023-03-12 MED ORDER — METHYLPHENIDATE HCL 10 MG PO TABS
10.0000 mg | ORAL_TABLET | Freq: Three times a day (TID) | ORAL | 0 refills | Status: DC
  Filled 2023-03-12: qty 71, 23d supply, fill #0
  Filled 2023-03-12: qty 200, 67d supply, fill #0
  Filled 2023-03-12: qty 199, 67d supply, fill #0

## 2023-03-12 MED ORDER — NORETHINDRONE-ETH ESTRADIOL 1-5 MG-MCG PO TABS
1.0000 | ORAL_TABLET | Freq: Every day | ORAL | 0 refills | Status: DC
  Filled 2023-03-12: qty 84, 84d supply, fill #0

## 2023-03-12 MED ORDER — UBRELVY 100 MG PO TABS: 100.0000 mg | ORAL_TABLET | ORAL | 0 refills | Status: DC | PRN

## 2023-03-12 NOTE — Telephone Encounter (Signed)
Wanting to make sure that the nurtec is dc'd prior to filling since I had a hard stop routing to provider to make sure Bernita Raisin is the med they do want the patient on.

## 2023-03-20 ENCOUNTER — Other Ambulatory Visit (HOSPITAL_COMMUNITY): Payer: Self-pay

## 2023-03-25 ENCOUNTER — Other Ambulatory Visit (HOSPITAL_BASED_OUTPATIENT_CLINIC_OR_DEPARTMENT_OTHER): Payer: Self-pay

## 2023-03-25 MED ORDER — BACITRA-NEOMYCIN-POLYMYXIN-HC 1 % OP OINT
TOPICAL_OINTMENT | OPHTHALMIC | 0 refills | Status: DC
  Filled 2023-03-25: qty 3.5, 5d supply, fill #0

## 2023-03-26 ENCOUNTER — Other Ambulatory Visit (HOSPITAL_BASED_OUTPATIENT_CLINIC_OR_DEPARTMENT_OTHER): Payer: Self-pay

## 2023-03-27 ENCOUNTER — Other Ambulatory Visit (HOSPITAL_COMMUNITY): Payer: Self-pay

## 2023-03-29 ENCOUNTER — Other Ambulatory Visit (HOSPITAL_COMMUNITY): Payer: Self-pay

## 2023-04-01 ENCOUNTER — Telehealth: Payer: Self-pay | Admitting: Neurology

## 2023-04-01 NOTE — Telephone Encounter (Signed)
Moved Botox appt from 5/27 to 6/7. Per staff message below from [place], [place] will not have Botox here in time for 5/27 appt.  From: Bertram Savin, RN  Sent: 03/27/2023   4:32 PM EDT  To: Yancey Flemings, NT   Stanford Breed,   Tamra w/ [facility] called and said that when they backfilled the Botox for the 2/28 appt (when the copay cards weren't working) it caused the next fill to not be available until 5/27 but her appt is on 5/24. Barrie Dunker is saying the appointment basically needs to be pushed out some so they can have it filled and delivered here. I see her next appt was scheduled for a Friday. Do you recall why and if the patient can move to 5/27 or after?   [employer]

## 2023-04-05 ENCOUNTER — Other Ambulatory Visit (HOSPITAL_BASED_OUTPATIENT_CLINIC_OR_DEPARTMENT_OTHER): Payer: Self-pay

## 2023-04-08 ENCOUNTER — Other Ambulatory Visit (HOSPITAL_COMMUNITY): Payer: Self-pay

## 2023-04-09 ENCOUNTER — Other Ambulatory Visit (HOSPITAL_COMMUNITY): Payer: Self-pay

## 2023-04-09 ENCOUNTER — Telehealth: Payer: Self-pay | Admitting: Pharmacy Technician

## 2023-04-09 NOTE — Telephone Encounter (Signed)
Patient Advocate Encounter   Received notification that prior authorization for Nurtec 75MG  dispersible tablets is required.   PA submitted on 04/09/2023 [facility] Insurance [employer] ePA Form Status is pending

## 2023-04-12 ENCOUNTER — Ambulatory Visit: Payer: Commercial Managed Care - PPO | Admitting: Neurology

## 2023-04-12 NOTE — Telephone Encounter (Signed)
Patient Advocate Encounter  Prior Authorization for Nurtec 75MG  dispersible tablets has been approved.    PA# [id number] Insurance [employer] ePA Form Effective dates: 04/10/2023 through 04/08/2024

## 2023-04-16 ENCOUNTER — Other Ambulatory Visit (HOSPITAL_COMMUNITY): Payer: Self-pay

## 2023-04-26 ENCOUNTER — Ambulatory Visit: Payer: Commercial Managed Care - PPO | Admitting: Neurology

## 2023-04-26 DIAGNOSIS — G43709 Chronic migraine without aura, not intractable, without status migrainosus: Secondary | ICD-10-CM

## 2023-04-26 DIAGNOSIS — G43009 Migraine without aura, not intractable, without status migrainosus: Secondary | ICD-10-CM

## 2023-04-26 MED ORDER — ONABOTULINUMTOXINA 200 UNITS IJ SOLR
155.0000 [IU] | Freq: Once | INTRAMUSCULAR | Status: AC
  Administered 2023-04-26: 155 [IU] via INTRAMUSCULAR

## 2023-04-26 NOTE — Progress Notes (Signed)
Consent Form Botulism Toxin Injection For Chronic Migraine  04/26/2023: doing great, stable  01/16/2023: doing well. >> 50% improvement on botox in headache freq and severity and duraction! Now only 4-5 migraines a month will give nurtc for as needed, Only 4-5 migraine days a month and <6 total headache days a month. Failed imitrex, rizatriptan.  Meds ordered this encounter  Medications   botulinum toxin Type A (BOTOX) injection 155 Units    09/09/2022: stable doing amazing >> 50% improvement on botox in headache freq and severity and duraction! Now only 4 migraines a month will give nurtc for as needed, Only 4 migraine days a month and 4 total headache days a month. Failed imitrex, rizatriptan.  06/19/2022: stable doing amazing! Now only 4 migraines a month with give nurtec for as needed  03/27/2022: doing great, 4 migraine days a month and they are mild and easily treatable. The wether made it a bit worse these last few months but >> 90% improvement.  01/02/2022: first botox. Baseline: daily headaches and 15-20 migraine days a month.   Reviewed orally with patient, additionally signature is on file:  Botulism toxin has been approved by the Federal drug administration for treatment of chronic migraine. Botulism toxin does not cure chronic migraine and it may not be effective in some patients.  The administration of botulism toxin is accomplished by injecting a small amount of toxin into the muscles of the neck and head. Dosage must be titrated for each individual. Any benefits resulting from botulism toxin tend to wear off after 3 months with a repeat injection required if benefit is to be maintained. Injections are usually done every 3-4 months with maximum effect peak achieved by about 2 or 3 weeks. Botulism toxin is expensive and you should be sure of what costs you will incur resulting from the injection.  The side effects of botulism toxin use for chronic migraine may include:   -Transient,  and usually mild, facial weakness with facial injections  -Transient, and usually mild, head or neck weakness with head/neck injections  -Reduction or loss of forehead facial animation due to forehead muscle weakness  -Eyelid drooping  -Dry eye  -Pain at the site of injection or bruising at the site of injection  -Double vision  -Potential unknown long term risks  Contraindications: You should not have Botox if you are pregnant, nursing, allergic to albumin, have an infection, skin condition, or muscle weakness at the site of the injection, or have myasthenia gravis, Lambert-Eaton syndrome, or ALS.  It is also possible that as with any injection, there may be an allergic reaction or no effect from the medication. Reduced effectiveness after repeated injections is sometimes seen and rarely infection at the injection site may occur. All care will be taken to prevent these side effects. If therapy is given over a long time, atrophy and wasting in the muscle injected may occur. Occasionally the patient's become refractory to treatment because they develop antibodies to the toxin. In this event, therapy needs to be modified.  I have read the above information and consent to the administration of botulism toxin.    BOTOX PROCEDURE NOTE FOR MIGRAINE HEADACHE    Contraindications and precautions discussed with patient(above). Aseptic procedure was observed and patient tolerated procedure. Procedure performed by Dr. Artemio Aly  The condition has existed for more than 6 months, and pt does not have a diagnosis of ALS, Myasthenia Gravis or Lambert-Eaton Syndrome.  Risks and benefits of injections discussed and pt  agrees to proceed with the procedure.  Written consent obtained  These injections are medically necessary. Pt  receives good benefits from these injections. These injections do not cause sedations or hallucinations which the oral therapies may cause.  Description of procedure:  The patient  was placed in a sitting position. The standard protocol was used for Botox as follows, with 5 units of Botox injected at each site:   -Procerus muscle, midline injection  -Corrugator muscle, bilateral injection  -Frontalis muscle, bilateral injection, with 2 sites each side, medial injection was performed in the upper one third of the frontalis muscle, in the region vertical from the medial inferior edge of the superior orbital rim. The lateral injection was again in the upper one third of the forehead vertically above the lateral limbus of the cornea, 1.5 cm lateral to the medial injection site.  -Temporalis muscle injection, 4 sites, bilaterally. The first injection was 3 cm above the tragus of the ear, second injection site was 1.5 cm to 3 cm up from the first injection site in line with the tragus of the ear. The third injection site was 1.5-3 cm forward between the first 2 injection sites. The fourth injection site was 1.5 cm posterior to the second injection site.   -Occipitalis muscle injection, 3 sites, bilaterally. The first injection was done one half way between the occipital protuberance and the tip of the mastoid process behind the ear. The second injection site was done lateral and superior to the first, 1 fingerbreadth from the first injection. The third injection site was 1 fingerbreadth superiorly and medially from the first injection site.  -Cervical paraspinal muscle injection, 2 sites, bilateral knee first injection site was 1 cm from the midline of the cervical spine, 3 cm inferior to the lower border of the occipital protuberance. The second injection site was 1.5 cm superiorly and laterally to the first injection site.  -Trapezius muscle injection was performed at 3 sites, bilaterally. The first injection site was in the upper trapezius muscle halfway between the inflection point of the neck, and the acromion. The second injection site was one half way between the acromion and  the first injection site. The third injection was done between the first injection site and the inflection point of the neck.   Will return for repeat injection in 3 months.   155 units of Botox was used, 45 Botox not injected was wasted. The patient tolerated the procedure well, there were no complications of the above procedure.

## 2023-04-26 NOTE — Progress Notes (Unsigned)
Botox- 200 units x 1 vial Lot: [id number] Expiration: 04/2025 NDC: 0865-7846-96  Bacteriostatic 0.9% Sodium Chloride- 4mL total [id number] Expiration: 02/2024 NDC: 4401-0272-53  Dx: [id number]  Witnessed by Toma Copier, RN

## 2023-04-27 ENCOUNTER — Other Ambulatory Visit: Payer: Self-pay | Admitting: Neurology

## 2023-04-30 MED ORDER — UBRELVY 100 MG PO TABS: 100.0000 mg | ORAL_TABLET | ORAL | 0 refills | Status: DC | PRN

## 2023-05-03 ENCOUNTER — Encounter: Payer: Self-pay | Admitting: Neurology

## 2023-05-03 ENCOUNTER — Telehealth: Payer: Self-pay | Admitting: Neurology

## 2023-05-03 DIAGNOSIS — G43009 Migraine without aura, not intractable, without status migrainosus: Secondary | ICD-10-CM

## 2023-05-03 NOTE — Telephone Encounter (Signed)
Pt called stating that her migraine medications have not been mailed to her and she is not able to get it through her local pharmacy either. Pt would like to know how she can get her medication soon. Please advise.

## 2023-05-06 ENCOUNTER — Other Ambulatory Visit (HOSPITAL_BASED_OUTPATIENT_CLINIC_OR_DEPARTMENT_OTHER): Payer: Self-pay

## 2023-05-06 MED ORDER — NURTEC 75 MG PO TBDP
75.0000 mg | ORAL_TABLET | Freq: Every day | ORAL | 12 refills | Status: DC | PRN
  Filled 2023-05-06: qty 16, 30d supply, fill #0
  Filled 2023-06-08: qty 16, 30d supply, fill #1
  Filled 2023-09-10: qty 16, 30d supply, fill #2
  Filled 2023-12-06: qty 16, 30d supply, fill #3
  Filled 2024-02-26: qty 16, 30d supply, fill #4

## 2023-05-06 NOTE — Telephone Encounter (Signed)
Pt is requesting a call back from [place], [place]

## 2023-05-06 NOTE — Addendum Note (Signed)
Addended by: Guy Begin on: 05/06/2023 12:15 PM   Modules accepted: Orders

## 2023-05-06 NOTE — Telephone Encounter (Signed)
Spoke to pt and nurtec works better for her.  Nurtec rx sent to [facility].  She appreciated call back.

## 2023-05-06 NOTE — Telephone Encounter (Signed)
I called [facility] prescription NURTEC was transferred out to [facility].  Spoe to Deborah York, only filled 01-16-2023 then cancelled.  Will send another prescription to her pharmacy.  Respond by phone or mychart which pharmacy.  She works for [employer].

## 2023-05-10 DIAGNOSIS — H52223 Regular astigmatism, bilateral: Secondary | ICD-10-CM | POA: Diagnosis not present

## 2023-05-10 DIAGNOSIS — H524 Presbyopia: Secondary | ICD-10-CM | POA: Diagnosis not present

## 2023-05-11 ENCOUNTER — Other Ambulatory Visit: Payer: Self-pay | Admitting: Family Medicine

## 2023-05-11 DIAGNOSIS — F900 Attention-deficit hyperactivity disorder, predominantly inattentive type: Secondary | ICD-10-CM

## 2023-05-13 ENCOUNTER — Other Ambulatory Visit (HOSPITAL_BASED_OUTPATIENT_CLINIC_OR_DEPARTMENT_OTHER): Payer: Self-pay

## 2023-05-13 MED ORDER — DESVENLAFAXINE SUCCINATE ER 50 MG PO TB24
ORAL_TABLET | Freq: Every day | ORAL | 0 refills | Status: DC
  Filled 2023-05-13: qty 30, 30d supply, fill #0

## 2023-05-13 NOTE — Telephone Encounter (Signed)
Pls contact the patient to schedule appt for Pristiq refill. Due in July; every 6 months. Sending 30 day refill. Thanks

## 2023-05-14 NOTE — Telephone Encounter (Signed)
Called patient, LVM to call office to schedule appointment for Pristiq, thanks.

## 2023-05-27 ENCOUNTER — Telehealth: Payer: Self-pay | Admitting: Family Medicine

## 2023-05-27 NOTE — Telephone Encounter (Signed)
Patient has appointment on Friday with the Nurse but it says labs in the notes please advise

## 2023-05-31 ENCOUNTER — Ambulatory Visit: Payer: Commercial Managed Care - PPO

## 2023-06-04 ENCOUNTER — Other Ambulatory Visit (HOSPITAL_BASED_OUTPATIENT_CLINIC_OR_DEPARTMENT_OTHER): Payer: Self-pay

## 2023-06-08 ENCOUNTER — Other Ambulatory Visit: Payer: Self-pay | Admitting: Neurology

## 2023-06-09 MED ORDER — UBRELVY 100 MG PO TABS: 100.0000 mg | ORAL_TABLET | ORAL | 0 refills | Status: DC | PRN

## 2023-06-13 ENCOUNTER — Other Ambulatory Visit: Payer: Self-pay | Admitting: Family Medicine

## 2023-06-13 DIAGNOSIS — F900 Attention-deficit hyperactivity disorder, predominantly inattentive type: Secondary | ICD-10-CM

## 2023-06-14 ENCOUNTER — Other Ambulatory Visit: Payer: Self-pay

## 2023-06-14 NOTE — Telephone Encounter (Signed)
Requesting rx rf for Ritalin 10mg  Last written 03/12/2023 Last OV 11/30/2022 No upcoming appt schld.

## 2023-06-17 ENCOUNTER — Other Ambulatory Visit: Payer: Self-pay

## 2023-06-17 ENCOUNTER — Other Ambulatory Visit (HOSPITAL_BASED_OUTPATIENT_CLINIC_OR_DEPARTMENT_OTHER): Payer: Self-pay

## 2023-06-17 MED ORDER — METHYLPHENIDATE HCL 10 MG PO TABS
10.0000 mg | ORAL_TABLET | Freq: Three times a day (TID) | ORAL | 0 refills | Status: DC
Start: 2023-06-17 — End: 2023-09-17
  Filled 2023-06-17: qty 270, 90d supply, fill #0

## 2023-07-09 ENCOUNTER — Other Ambulatory Visit (HOSPITAL_COMMUNITY): Payer: Self-pay

## 2023-07-14 ENCOUNTER — Other Ambulatory Visit: Payer: Self-pay | Admitting: Family Medicine

## 2023-07-14 DIAGNOSIS — F900 Attention-deficit hyperactivity disorder, predominantly inattentive type: Secondary | ICD-10-CM

## 2023-07-16 ENCOUNTER — Other Ambulatory Visit (HOSPITAL_BASED_OUTPATIENT_CLINIC_OR_DEPARTMENT_OTHER): Payer: Self-pay

## 2023-07-18 ENCOUNTER — Other Ambulatory Visit: Payer: Self-pay | Admitting: Family Medicine

## 2023-07-18 DIAGNOSIS — F900 Attention-deficit hyperactivity disorder, predominantly inattentive type: Secondary | ICD-10-CM

## 2023-07-19 ENCOUNTER — Telehealth: Payer: Self-pay

## 2023-07-19 ENCOUNTER — Other Ambulatory Visit (HOSPITAL_COMMUNITY): Payer: Self-pay

## 2023-07-19 NOTE — Telephone Encounter (Signed)
Pharmacy Patient Advocate Encounter   Received notification from Patient Pharmacy that prior authorization for Botox 200UNIT solution is required/requested.   Insurance verification completed.   The patient is insured through [facility] .   Per test claim: PA required; PA submitted to [facility] via CoverMyMeds Key/confirmation #/[id number] [facility] Status is pending   Benefit Verification [id number] Submitted!

## 2023-07-23 ENCOUNTER — Other Ambulatory Visit (HOSPITAL_COMMUNITY): Payer: Self-pay

## 2023-07-23 ENCOUNTER — Other Ambulatory Visit (HOSPITAL_BASED_OUTPATIENT_CLINIC_OR_DEPARTMENT_OTHER): Payer: Self-pay

## 2023-07-23 MED ORDER — DESVENLAFAXINE SUCCINATE ER 50 MG PO TB24
ORAL_TABLET | Freq: Every day | ORAL | 0 refills | Status: DC
Start: 2023-07-23 — End: 2023-08-29
  Filled 2023-07-23: qty 30, 30d supply, fill #0

## 2023-07-24 ENCOUNTER — Other Ambulatory Visit: Payer: Self-pay

## 2023-07-24 ENCOUNTER — Other Ambulatory Visit (HOSPITAL_COMMUNITY): Payer: Self-pay

## 2023-07-24 NOTE — Telephone Encounter (Signed)
   PA Approved thru 07/17/2023 COPAY is $0 with Botox One Copay card or $750 without the card [facility] is aware the PA has been approved and are filling the RX.

## 2023-07-26 ENCOUNTER — Encounter: Payer: Self-pay | Admitting: Sports Medicine

## 2023-07-26 ENCOUNTER — Ambulatory Visit: Payer: Commercial Managed Care - PPO | Admitting: Sports Medicine

## 2023-07-26 ENCOUNTER — Ambulatory Visit (INDEPENDENT_AMBULATORY_CARE_PROVIDER_SITE_OTHER): Payer: Commercial Managed Care - PPO

## 2023-07-26 DIAGNOSIS — M79672 Pain in left foot: Secondary | ICD-10-CM | POA: Diagnosis not present

## 2023-07-26 DIAGNOSIS — M84375A Stress fracture, left foot, initial encounter for fracture: Secondary | ICD-10-CM | POA: Diagnosis not present

## 2023-07-26 NOTE — Assessment & Plan Note (Addendum)
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 She has had several weeks of increasing pain left foot localized medial arch at the navicular. She has no tenderness at the plantar fascial origin, she has severe tenderness at the navicular prominence, pain with resisted inversion but no tenderness over the tibialis posterior itself. Leg lengths are equal. We discussed the potential of a navicular stress injury. We will get her some custom molded orthotics with the [facility] sports medicine location to support her pes cavus, a cam boot that she can wear the orthotics and for now, x-rays, and an early MRI. Weightbearing as tolerated for now. Return to see me in 4 to 6 weeks.  Update: Severe edema noted in the navicular consistent with a stress fracture, there is also partial tearing of the tibialis posterior, minimizing weightbearing in the cam boot with orthotics will be critical for now.

## 2023-07-26 NOTE — Progress Notes (Addendum)
    Procedures performed today:    None.  Independent interpretation of notes and tests performed by another provider:   None.  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 She has had several weeks of increasing pain left foot localized medial arch at the navicular. She has no tenderness at the plantar fascial origin, she has severe tenderness at the navicular prominence, pain with resisted inversion but no tenderness over the tibialis posterior itself. Leg lengths are equal. We discussed the potential of a navicular stress injury. We will get her some custom molded orthotics with the [facility] sports medicine location to support her pes cavus, a cam boot that she can wear the orthotics and for now, x-rays, and an early MRI. Weightbearing as tolerated for now. Return to see me in 4 to 6 weeks.  Update: Severe edema noted in the navicular consistent with a stress fracture, there is also partial tearing of the tibialis posterior, minimizing weightbearing in the cam boot with orthotics will be critical for now.    ____________________________________________ Ihor Austin. Benjamin Stain, M.D., ABFM., CAQSM., AME. Primary Care and Sports Medicine [facility]  Adjunct Professor of Family Medicine  [place] [employer] [facility] [employer]  Restaurant manager, fast food

## 2023-07-28 ENCOUNTER — Ambulatory Visit (INDEPENDENT_AMBULATORY_CARE_PROVIDER_SITE_OTHER): Payer: Commercial Managed Care - PPO

## 2023-07-28 DIAGNOSIS — S86212A Strain of muscle(s) and tendon(s) of anterior muscle group at lower leg level, left leg, initial encounter: Secondary | ICD-10-CM | POA: Diagnosis not present

## 2023-07-28 DIAGNOSIS — M84375A Stress fracture, left foot, initial encounter for fracture: Secondary | ICD-10-CM

## 2023-07-28 DIAGNOSIS — R600 Localized edema: Secondary | ICD-10-CM | POA: Diagnosis not present

## 2023-08-02 ENCOUNTER — Encounter: Payer: Self-pay | Admitting: Family Medicine

## 2023-08-02 ENCOUNTER — Ambulatory Visit: Payer: Commercial Managed Care - PPO | Admitting: Family Medicine

## 2023-08-02 ENCOUNTER — Ambulatory Visit: Payer: Commercial Managed Care - PPO | Admitting: Neurology

## 2023-08-02 VITALS — BP 124/78 | Ht 67.0 in | Wt 137.0 lb

## 2023-08-02 DIAGNOSIS — G43709 Chronic migraine without aura, not intractable, without status migrainosus: Secondary | ICD-10-CM | POA: Diagnosis not present

## 2023-08-02 DIAGNOSIS — M216X9 Other acquired deformities of unspecified foot: Secondary | ICD-10-CM

## 2023-08-02 MED ORDER — ONABOTULINUMTOXINA 200 UNITS IJ SOLR
155.0000 [IU] | Freq: Once | INTRAMUSCULAR | Status: AC
Start: 2023-08-02 — End: 2023-08-02
  Administered 2023-08-02: 155 [IU] via INTRAMUSCULAR

## 2023-08-02 NOTE — Progress Notes (Signed)
Deborah York - 67 y.o. female MRN [id number]  Date of birth: 07-06-1956  PCP: Everrett Coombe, DO  Subjective:  No chief complaint on file. Deborah York is a 67 year old female here for orthotics.  She is currently wearing a cam walker boot on her left side for a navicular stress fracture.  She does a lot of hiking with her family and has developed some foot pain over time as well as the stress fracture.  She is able to bear weight for several steps on the left foot.  Past Medical History:  Diagnosis Date   Complication of anesthesia    Hypertension    Migraines    PONV (postoperative nausea and vomiting)     Current Outpatient Medications on File Prior to Visit  Medication Sig Dispense Refill   botulinum toxin Type A (BOTOX) 200 units injection Provider to inject 155 units into the muscles of the head and neck every 12 weeks. Discard remainder. 1 each 3   CALCIUM PO Take by mouth.     clindamycin (CLEOCIN T) 1 % external solution Apply topically 2 (two) times daily. 60 mL 6   desvenlafaxine (PRISTIQ) 50 MG 24 hr tablet TAKE 1 TABLET BY MOUTH ONCE DAILY 30 tablet 0   gabapentin (NEURONTIN) 600 MG tablet TAKE 1 TABLET BY MOUTH NIGHTLY AS NEEDED 90 tablet 1   Galcanezumab-gnlm (EMGALITY) 120 MG/ML SOAJ Inject into the skin.     methylphenidate (RITALIN) 10 MG tablet Take 1 tablet (10 mg total) by mouth 3 (three) times daily with meals. 270 tablet 0   norethindrone-ethinyl estradiol (FEMHRT 1/5) 1-5 MG-MCG TABS tablet Take 1 tablet by mouth daily. 84 tablet 0   ondansetron (ZOFRAN-ODT) 4 MG disintegrating tablet Take 1 tablet (4 mg total) by mouth every 8 (eight) hours as needed for nausea or vomiting. 20 tablet 6   Prasterone (INTRAROSA) 6.5 MG INST Place 6.5 mg vaginally daily. 28 each 3   Rimegepant Sulfate (NURTEC) 75 MG TBDP Take 1 tablet (75 mg total) by mouth daily as needed. For migraines. Take as close to onset of migraine as possible. One daily maximum. 4-5 migraines a month and < 6  total headache days a month. will give nurtc for as needed, Failed imitrex, rizatriptan AND ubrelvy 16 tablet 12   rosuvastatin (CRESTOR) 10 MG tablet Take on Monday, Wednesday, Friday 90 tablet 3   spironolactone (ALDACTONE) 100 MG tablet Take 1 tablet (100 mg total) by mouth daily. 90 tablet 3   SUMAtriptan (IMITREX) 100 MG tablet Take 1 tablet (100 mg total) by mouth 2 (two) times daily as needed for migraine. Max 2 tablets per day. 24 tablet 0   telmisartan (MICARDIS) 20 MG tablet Take 1 tablet by mouth daily 90 tablet 3   tretinoin (RETIN-A) 0.1 % cream APPLY 1 DIME-SIZED AMOUNT TO THE ENTIRE FACE NIGHTLY AS TOLERATED 45 g 3   Ubrogepant (UBRELVY) 100 MG TABS Take 1 tablet (100 mg total) by mouth every 2 (two) hours as needed. Maximum 200mg  a day. 10 tablet 0   VITAMIN D PO Take by mouth.     No current facility-administered medications on file prior to visit.    Past Surgical History:  Procedure Laterality Date   IRRIGATION AND DEBRIDEMENT SHOULDER Right 03/23/2021   Procedure: IRRIGATION AND DEBRIDEMENT SHOULDER;  Surgeon: Bjorn Pippin, MD;  Location: [facility];  Service: Orthopedics;  Laterality: Right;   LYSIS OF ADHESION Right 03/23/2021   Procedure: LYSIS OF ADHESION WITH MANIPULATION;  Surgeon: Bjorn Pippin, MD;  Location: [facility];  Service: Orthopedics;  Laterality: Right;   SHOULDER ACROMIOPLASTY Right 03/23/2021   Procedure: SHOULDER ACROMIOPLASTY;  Surgeon: Bjorn Pippin, MD;  Location: [facility];  Service: Orthopedics;  Laterality: Right;   SHOULDER ARTHROSCOPY WITH DISTAL CLAVICLE RESECTION Right 03/23/2021   Procedure: SHOULDER ARTHROSCOPY WITH DISTAL CLAVICLE RESECTION;  Surgeon: Bjorn Pippin, MD;  Location: [facility];  Service: Orthopedics;  Laterality: Right;   TOE SURGERY      Allergies  Allergen Reactions   Sulfa Antibiotics Rash        Objective:  Physical Exam: VS: BP:124/78  HR: bpm  TEMP: ( )   RESP:   HT:5\' 7"  (170.2 cm)   WT:137 lb (62.1 kg)  BMI:21.45  Gen: NAD, speaks clearly, comfortable in exam room Respiratory: normal work of breathing on room air MSK: Inspection of the ankles and feet while standing reveals left greater than right supination of the hindfoot, with high arches.    Assessment & Plan:   Acquired supination of foot Patient was fitted for a standard, cushioned, semi-rigid orthotic.  The orthotic was heated and the patient stood on the orthotic blank positioned on the orthotic stand. The patient was positioned in subtalar neutral position and 10 degrees of ankle dorsiflexion in a weight bearing stance. size: 9 posting: lateral heel wedge additional orthotic padding: none Orthotics fit well and patients gait: Improved with correction of supination to neutral position.  She reports they are comfortable and looks forward to using them.    Deborah Mote MD [facility] Sports Medicine Fellow  Addendum:  Patient seen in the office by fellow.  His history, exam, plan of care were precepted with me.  Deborah Blizzard MD Deborah York

## 2023-08-02 NOTE — Assessment & Plan Note (Signed)
Patient was fitted for a standard, cushioned, semi-rigid orthotic.  The orthotic was heated and the patient stood on the orthotic blank positioned on the orthotic stand. The patient was positioned in subtalar neutral position and 10 degrees of ankle dorsiflexion in a weight bearing stance. size: 9 posting: lateral heal wedge additional orthotic padding: none Orthotics fit well and patients gait: Improved with correction of supination to neutral position.  She reports they are comfortable and looks forward to using them.

## 2023-08-02 NOTE — Progress Notes (Signed)
Botox- 200 units x 1 vial Lot: [id number] Expiration: 10/2025 NDC: 7829-5621-30  Bacteriostatic 0.9% Sodium Chloride-4 mL  Lot: [id number] Expiration: 02/18/2024 NDC: 6962-9528-41  Dx: L24.401 S/P  Witnessed by AA MD

## 2023-08-02 NOTE — Progress Notes (Signed)
Consent Form Botulism Toxin Injection For Chronic Migraine  08/02/2023: stable, doing extremely well if not better than before  04/26/2023: doing great, stable  01/16/2023: doing well. >> 50% improvement on botox in headache freq and severity and duraction! Now only 4-5 migraines a month will give nurtc for as needed, Only 4-5 migraine days a month and <6 total headache days a month. Failed imitrex, rizatriptan.  No orders of the defined types were placed in this encounter.   09/09/2022: stable doing amazing >> 50% improvement on botox in headache freq and severity and duraction! Now only 4 migraines a month will give nurtc for as needed, Only 4 migraine days a month and 4 total headache days a month. Failed imitrex, rizatriptan.  06/19/2022: stable doing amazing! Now only 4 migraines a month with give nurtec for as needed  03/27/2022: doing great, 4 migraine days a month and they are mild and easily treatable. The wether made it a bit worse these last few months but >> 90% improvement.  01/02/2022: first botox. Baseline: daily headaches and 15-20 migraine days a month.   Reviewed orally with patient, additionally signature is on file:  Botulism toxin has been approved by the Federal drug administration for treatment of chronic migraine. Botulism toxin does not cure chronic migraine and it may not be effective in some patients.  The administration of botulism toxin is accomplished by injecting a small amount of toxin into the muscles of the neck and head. Dosage must be titrated for each individual. Any benefits resulting from botulism toxin tend to wear off after 3 months with a repeat injection required if benefit is to be maintained. Injections are usually done every 3-4 months with maximum effect peak achieved by about 2 or 3 weeks. Botulism toxin is expensive and you should be sure of what costs you will incur resulting from the injection.  The side effects of botulism toxin use for chronic  migraine may include:   -Transient, and usually mild, facial weakness with facial injections  -Transient, and usually mild, head or neck weakness with head/neck injections  -Reduction or loss of forehead facial animation due to forehead muscle weakness  -Eyelid drooping  -Dry eye  -Pain at the site of injection or bruising at the site of injection  -Double vision  -Potential unknown long term risks  Contraindications: You should not have Botox if you are pregnant, nursing, allergic to albumin, have an infection, skin condition, or muscle weakness at the site of the injection, or have myasthenia gravis, Lambert-Eaton syndrome, or ALS.  It is also possible that as with any injection, there may be an allergic reaction or no effect from the medication. Reduced effectiveness after repeated injections is sometimes seen and rarely infection at the injection site may occur. All care will be taken to prevent these side effects. If therapy is given over a long time, atrophy and wasting in the muscle injected may occur. Occasionally the patient's become refractory to treatment because they develop antibodies to the toxin. In this event, therapy needs to be modified.  I have read the above information and consent to the administration of botulism toxin.    BOTOX PROCEDURE NOTE FOR MIGRAINE HEADACHE    Contraindications and precautions discussed with patient(above). Aseptic procedure was observed and patient tolerated procedure. Procedure performed by Dr. Artemio Aly  The condition has existed for more than 6 months, and pt does not have a diagnosis of ALS, Myasthenia Gravis or Lambert-Eaton Syndrome.  Risks and benefits  of injections discussed and pt agrees to proceed with the procedure.  Written consent obtained  These injections are medically necessary. Pt  receives good benefits from these injections. These injections do not cause sedations or hallucinations which the oral therapies may  cause.  Description of procedure:  The patient was placed in a sitting position. The standard protocol was used for Botox as follows, with 5 units of Botox injected at each site:   -Procerus muscle, midline injection  -Corrugator muscle, bilateral injection  -Frontalis muscle, bilateral injection, with 2 sites each side, medial injection was performed in the upper one third of the frontalis muscle, in the region vertical from the medial inferior edge of the superior orbital rim. The lateral injection was again in the upper one third of the forehead vertically above the lateral limbus of the cornea, 1.5 cm lateral to the medial injection site.  -Temporalis muscle injection, 4 sites, bilaterally. The first injection was 3 cm above the tragus of the ear, second injection site was 1.5 cm to 3 cm up from the first injection site in line with the tragus of the ear. The third injection site was 1.5-3 cm forward between the first 2 injection sites. The fourth injection site was 1.5 cm posterior to the second injection site.   -Occipitalis muscle injection, 3 sites, bilaterally. The first injection was done one half way between the occipital protuberance and the tip of the mastoid process behind the ear. The second injection site was done lateral and superior to the first, 1 fingerbreadth from the first injection. The third injection site was 1 fingerbreadth superiorly and medially from the first injection site.  -Cervical paraspinal muscle injection, 2 sites, bilateral knee first injection site was 1 cm from the midline of the cervical spine, 3 cm inferior to the lower border of the occipital protuberance. The second injection site was 1.5 cm superiorly and laterally to the first injection site.  -Trapezius muscle injection was performed at 3 sites, bilaterally. The first injection site was in the upper trapezius muscle halfway between the inflection point of the neck, and the acromion. The second  injection site was one half way between the acromion and the first injection site. The third injection was done between the first injection site and the inflection point of the neck.   Will return for repeat injection in 3 months.   155 units of Botox was used, 45 Botox not injected was wasted. The patient tolerated the procedure well, there were no complications of the above procedure.

## 2023-08-20 ENCOUNTER — Other Ambulatory Visit (HOSPITAL_BASED_OUTPATIENT_CLINIC_OR_DEPARTMENT_OTHER): Payer: Self-pay

## 2023-08-20 MED ORDER — PAXLOVID (300/100) 20 X 150 MG & 10 X 100MG PO TBPK
3.0000 | ORAL_TABLET | Freq: Two times a day (BID) | ORAL | 0 refills | Status: DC
Start: 2023-08-20 — End: 2023-09-06
  Filled 2023-08-20: qty 30, 5d supply, fill #0

## 2023-08-29 ENCOUNTER — Other Ambulatory Visit: Payer: Self-pay | Admitting: Family Medicine

## 2023-08-29 ENCOUNTER — Other Ambulatory Visit (HOSPITAL_COMMUNITY): Payer: Self-pay

## 2023-08-29 ENCOUNTER — Other Ambulatory Visit (HOSPITAL_BASED_OUTPATIENT_CLINIC_OR_DEPARTMENT_OTHER): Payer: Self-pay

## 2023-08-29 DIAGNOSIS — F900 Attention-deficit hyperactivity disorder, predominantly inattentive type: Secondary | ICD-10-CM

## 2023-08-30 ENCOUNTER — Other Ambulatory Visit (HOSPITAL_BASED_OUTPATIENT_CLINIC_OR_DEPARTMENT_OTHER): Payer: Self-pay

## 2023-08-30 ENCOUNTER — Other Ambulatory Visit: Payer: Self-pay

## 2023-08-30 MED ORDER — DESVENLAFAXINE SUCCINATE ER 50 MG PO TB24
50.0000 mg | ORAL_TABLET | Freq: Every day | ORAL | 0 refills | Status: DC
  Filled 2023-08-30: qty 30, 30d supply, fill #0

## 2023-09-06 ENCOUNTER — Ambulatory Visit: Payer: Commercial Managed Care - PPO | Admitting: Sports Medicine

## 2023-09-06 DIAGNOSIS — M84375A Stress fracture, left foot, initial encounter for fracture: Secondary | ICD-10-CM

## 2023-09-06 NOTE — Progress Notes (Signed)
    Procedures performed today:    None.  Independent interpretation of notes and tests performed by another provider:   None.  Brief History, Exam, Impression, and Recommendations:    Stress fracture of navicular bone of left foot Exquisitely pleasant 67 year old female, she presented with several weeks of increasing pain left foot medial arch at the navicular back in September, we suspected tibialis posterior tendinopathy versus a navicular stress fracture, obtained an MRI that confirmed the above. She got custom molded orthotics, he did a cam boot for 4 to 6 weeks, tibialis posterior conditioning, she returns today completely symptom-free with resolution of pain, swelling, she has good motion, good strength, she can discontinue the boot, continue to wear orthotics daily, avoid barefoot walking, continue home physical therapy and return to see me as needed.    ____________________________________________ Ihor Austin. Benjamin Stain, M.D., ABFM., CAQSM., AME. Primary Care and Sports Medicine [facility]  Adjunct Professor of Family Medicine  [place] [employer] [facility] [employer] [facility] [employer] [employer]  Restaurant manager, fast food

## 2023-09-06 NOTE — Assessment & Plan Note (Signed)
Exquisitely pleasant 67 year old female, she presented with several weeks of increasing pain left foot medial arch at the navicular back in September, we suspected tibialis posterior tendinopathy versus a navicular stress fracture, obtained an MRI that confirmed the above. She got custom molded orthotics, he did a cam boot for 4 to 6 weeks, tibialis posterior conditioning, she returns today completely symptom-free with resolution of pain, swelling, she has good motion, good strength, she can discontinue the boot, continue to wear orthotics daily, avoid barefoot walking, continue home physical therapy and return to see me as needed.

## 2023-09-10 ENCOUNTER — Other Ambulatory Visit: Payer: Self-pay | Admitting: Family Medicine

## 2023-09-11 ENCOUNTER — Other Ambulatory Visit (HOSPITAL_BASED_OUTPATIENT_CLINIC_OR_DEPARTMENT_OTHER): Payer: Self-pay

## 2023-09-11 ENCOUNTER — Encounter (HOSPITAL_BASED_OUTPATIENT_CLINIC_OR_DEPARTMENT_OTHER): Payer: Self-pay

## 2023-09-17 ENCOUNTER — Other Ambulatory Visit: Payer: Self-pay | Admitting: Family Medicine

## 2023-09-17 DIAGNOSIS — F900 Attention-deficit hyperactivity disorder, predominantly inattentive type: Secondary | ICD-10-CM

## 2023-09-20 ENCOUNTER — Encounter: Payer: Self-pay | Admitting: Family Medicine

## 2023-09-20 ENCOUNTER — Other Ambulatory Visit (HOSPITAL_BASED_OUTPATIENT_CLINIC_OR_DEPARTMENT_OTHER): Payer: Self-pay

## 2023-09-20 ENCOUNTER — Other Ambulatory Visit: Payer: Self-pay

## 2023-09-20 ENCOUNTER — Ambulatory Visit: Payer: Commercial Managed Care - PPO | Admitting: Family Medicine

## 2023-09-20 VITALS — BP 113/66 | HR 71 | Ht 67.0 in | Wt 141.0 lb

## 2023-09-20 DIAGNOSIS — R350 Frequency of micturition: Secondary | ICD-10-CM

## 2023-09-20 DIAGNOSIS — N3946 Mixed incontinence: Secondary | ICD-10-CM | POA: Insufficient documentation

## 2023-09-20 LAB — POCT URINALYSIS DIP (CLINITEK)
Bilirubin, UA: NEGATIVE
Glucose, UA: NEGATIVE mg/dL
Ketones, POC UA: NEGATIVE mg/dL
Nitrite, UA: NEGATIVE
POC PROTEIN,UA: NEGATIVE
Spec Grav, UA: 1.015 (ref 1.010–1.025)
Urobilinogen, UA: 0.2 U/dL
pH, UA: 8 (ref 5.0–8.0)

## 2023-09-20 MED ORDER — INTRAROSA 6.5 MG VA INST
6.5000 mg | VAGINAL_INSERT | Freq: Every day | VAGINAL | 3 refills | Status: AC
  Filled 2023-09-20: qty 28, 28d supply, fill #0
  Filled 2024-01-12: qty 28, 28d supply, fill #1

## 2023-09-20 MED ORDER — NORETHINDRONE-ETH ESTRADIOL 1-5 MG-MCG PO TABS
1.0000 | ORAL_TABLET | Freq: Every day | ORAL | 1 refills | Status: DC
  Filled 2023-09-20: qty 84, 84d supply, fill #0
  Filled 2024-01-12: qty 84, 84d supply, fill #1

## 2023-09-20 MED ORDER — MIRABEGRON ER 25 MG PO TB24
25.0000 mg | ORAL_TABLET | Freq: Every day | ORAL | 1 refills | Status: DC
  Filled 2023-09-20: qty 90, 90d supply, fill #0
  Filled 2023-12-06: qty 90, 90d supply, fill #1

## 2023-09-20 NOTE — Assessment & Plan Note (Signed)
She does have some symptoms of OAB and urge incontinence.  Adding myrbetriq.  Small blood and leuks on UA, sent for culture.  Referral to Dr. Imogene Burn per patient request.

## 2023-09-20 NOTE — Progress Notes (Signed)
Deborah York - 67 y.o. female MRN [id number]  Date of birth: [birth year]  Subjective Chief Complaint  Patient presents with   Urinary Frequency    HPI Deborah York is a 67 y.o. female here today with complaint of urinary urgency and frequency.  Symptoms started a few weeks ago.  Had UTI that was treated with antibiotics.  She reports that majority of symptoms have improved but continues to have urgency.  She would like to see gynecology as she thinks she is having some stress incontinence as well.  She denies pain with urination, back pain, fever or chills.   ROS:  A comprehensive ROS was completed and negative except as noted per HPI  Allergies  Allergen Reactions   Sulfa Antibiotics Rash    Past Medical History:  Diagnosis Date   Complication of anesthesia    Hypertension    Migraines    PONV (postoperative nausea and vomiting)     Past Surgical History:  Procedure Laterality Date   IRRIGATION AND DEBRIDEMENT SHOULDER Right 03/23/2021   Procedure: IRRIGATION AND DEBRIDEMENT SHOULDER;  Surgeon: Bjorn Pippin, MD;  Location: [facility];  Service: Orthopedics;  Laterality: Right;   LYSIS OF ADHESION Right 03/23/2021   Procedure: LYSIS OF ADHESION WITH MANIPULATION;  Surgeon: Bjorn Pippin, MD;  Location: [facility];  Service: Orthopedics;  Laterality: Right;   SHOULDER ACROMIOPLASTY Right 03/23/2021   Procedure: SHOULDER ACROMIOPLASTY;  Surgeon: Bjorn Pippin, MD;  Location: [facility];  Service: Orthopedics;  Laterality: Right;   SHOULDER ARTHROSCOPY WITH DISTAL CLAVICLE RESECTION Right 03/23/2021   Procedure: SHOULDER ARTHROSCOPY WITH DISTAL CLAVICLE RESECTION;  Surgeon: Bjorn Pippin, MD;  Location: [facility];  Service: Orthopedics;  Laterality: Right;   TOE SURGERY      Social History   Socioeconomic History   Marital status: Married    Spouse name: Not on file   Number of children: Not on file   Years of  education: Not on file   Highest education level: Not on file  Occupational History   Not on file  Tobacco Use   Smoking status: Former   Smokeless tobacco: Former  Advertising account planner   Vaping status: Never Used  Substance and Sexual Activity   Alcohol use: Not Currently   Drug use: Never   Sexual activity: Not on file  Other Topics Concern   Not on file  Social History Narrative   Caffeine- one cup daily, diet coke daily.  Education: Family MD (healthy weight wellness).      Social Determinants of Health   Financial Resource Strain: Not on file  Food Insecurity: Not on file  Transportation Needs: Not on file  Physical Activity: Not on file  Stress: Not on file  Social Connections: Unknown (04/01/2022)   [facility]   Social Network    Social Network: Not on file    Family History  Problem Relation Age of Onset   Cancer Mother    Migraines Mother    Hypertension Mother    Heart failure Father    Migraines Maternal Grandmother    Migraines Other     Health Maintenance  Topic Date Due   Hepatitis C Screening  Never done   COVID-19 Vaccine (6 - 2023-24 season) 07/21/2023   INFLUENZA VACCINE  02/17/2024 (Originally 06/20/2023)   MAMMOGRAM  11/10/2023   Colonoscopy  02/18/2031   DTaP/Tdap/Td (3 - Td or Tdap) 11/30/2032  Pneumonia Vaccine 84+ Years old  Completed   DEXA SCAN  Completed   Zoster Vaccines- Shingrix  Completed   HPV VACCINES  Aged Out     ----------------------------------------------------------------------------------------------------------------------------------------------------------------------------------------------------------------- Physical Exam BP 113/66 (BP Location: Left Arm, Patient Position: Sitting, Cuff Size: Small)   Pulse 71   Ht 5\' 7"  (1.702 m)   Wt 141 lb (64 kg)   SpO2 100%   BMI 22.08 kg/m   Physical Exam Constitutional:      Appearance: Normal appearance.  Neurological:     Mental Status: She  is alert.  Psychiatric:        Mood and Affect: Mood normal.        Behavior: Behavior normal.     ------------------------------------------------------------------------------------------------------------------------------------------------------------------------------------------------------------------- Assessment and Plan  Mixed stress and urge urinary incontinence She does have some symptoms of OAB and urge incontinence.  Adding myrbetriq.  Small blood and leuks on UA, sent for culture.  Referral to Dr. Imogene Burn per patient request.    Meds ordered this encounter  Medications   mirabegron ER (MYRBETRIQ) 25 MG TB24 tablet    Sig: Take 1 tablet (25 mg total) by mouth daily.    Dispense:  90 tablet    Refill:  1   norethindrone-ethinyl estradiol (FEMHRT 1/5) 1-5 MG-MCG TABS tablet    Sig: Take 1 tablet by mouth daily.    Dispense:  84 tablet    Refill:  1   Prasterone (INTRAROSA) 6.5 MG INST    Sig: Place 6.5 mg vaginally daily.    Dispense:  28 each    Refill:  3    No follow-ups on file.    This visit occurred during the SARS-CoV-2 public health emergency.  Safety protocols were in place, including screening questions prior to the visit, additional usage of staff PPE, and extensive cleaning of exam room while observing appropriate contact time as indicated for disinfecting solutions.

## 2023-09-23 ENCOUNTER — Other Ambulatory Visit (HOSPITAL_BASED_OUTPATIENT_CLINIC_OR_DEPARTMENT_OTHER): Payer: Self-pay

## 2023-09-25 ENCOUNTER — Other Ambulatory Visit (HOSPITAL_BASED_OUTPATIENT_CLINIC_OR_DEPARTMENT_OTHER): Payer: Self-pay

## 2023-09-25 ENCOUNTER — Other Ambulatory Visit (HOSPITAL_COMMUNITY): Payer: Self-pay

## 2023-09-25 ENCOUNTER — Telehealth: Payer: Self-pay | Admitting: Family Medicine

## 2023-09-25 MED ORDER — DESVENLAFAXINE SUCCINATE ER 50 MG PO TB24
50.0000 mg | ORAL_TABLET | Freq: Every day | ORAL | 0 refills | Status: DC
  Filled 2023-09-25: qty 30, 30d supply, fill #0

## 2023-09-25 MED ORDER — METHYLPHENIDATE HCL 10 MG PO TABS
10.0000 mg | ORAL_TABLET | Freq: Three times a day (TID) | ORAL | 0 refills | Status: DC
  Filled 2023-09-25: qty 270, 90d supply, fill #0

## 2023-09-25 NOTE — Telephone Encounter (Signed)
Prescription Request  09/25/2023  LOV: 09/20/2023  What is the name of the medication or equipment? desvenlafaxine (PRISTIQ) 50 MG 24 hr tablet and methylphenidate (RITALIN) 10 MG tablet   Have you contacted your pharmacy to request a refill? Yes   Which pharmacy would you like this sent to?   [facility] [address], [address] [place] [address] Phone: [phone] Fax: [phone]   Patient notified that their request is being sent to the clinical staff for review and that they should receive a response within 2 business days.   Please advise at [facility] [phone]

## 2023-09-26 ENCOUNTER — Other Ambulatory Visit: Payer: Self-pay | Admitting: Family Medicine

## 2023-09-26 ENCOUNTER — Other Ambulatory Visit (HOSPITAL_BASED_OUTPATIENT_CLINIC_OR_DEPARTMENT_OTHER): Payer: Self-pay

## 2023-09-26 ENCOUNTER — Other Ambulatory Visit: Payer: Self-pay

## 2023-09-26 LAB — URINE CULTURE

## 2023-09-26 MED ORDER — NITROFURANTOIN MONOHYD MACRO 100 MG PO CAPS
100.0000 mg | ORAL_CAPSULE | Freq: Two times a day (BID) | ORAL | 0 refills | Status: DC
  Filled 2023-09-26: qty 14, 7d supply, fill #0

## 2023-10-06 ENCOUNTER — Other Ambulatory Visit: Payer: Self-pay | Admitting: Family Medicine

## 2023-10-07 ENCOUNTER — Other Ambulatory Visit: Payer: Self-pay

## 2023-10-07 ENCOUNTER — Other Ambulatory Visit (HOSPITAL_BASED_OUTPATIENT_CLINIC_OR_DEPARTMENT_OTHER): Payer: Self-pay

## 2023-10-07 MED ORDER — GABAPENTIN 600 MG PO TABS
600.0000 mg | ORAL_TABLET | Freq: Every evening | ORAL | 1 refills | Status: AC | PRN
  Filled 2023-10-07: qty 90, 90d supply, fill #0

## 2023-10-16 ENCOUNTER — Other Ambulatory Visit (HOSPITAL_COMMUNITY): Payer: Self-pay

## 2023-10-16 ENCOUNTER — Other Ambulatory Visit (HOSPITAL_COMMUNITY): Payer: Self-pay | Admitting: Pharmacy Technician

## 2023-10-16 NOTE — Progress Notes (Signed)
Specialty Pharmacy Refill Coordination Note  Deborah York is a 67 y.o. female contacted today regarding refills of specialty medication(s) Onabotulinumtoxina   Patient requested Courier to Provider Office   Delivery date: 10/28/23   Verified address: [facility] [address] [place], [place]   Medication will be filled on 10/25/23.

## 2023-10-28 ENCOUNTER — Other Ambulatory Visit: Payer: Self-pay | Admitting: Family Medicine

## 2023-10-28 DIAGNOSIS — F900 Attention-deficit hyperactivity disorder, predominantly inattentive type: Secondary | ICD-10-CM

## 2023-10-29 ENCOUNTER — Other Ambulatory Visit (HOSPITAL_BASED_OUTPATIENT_CLINIC_OR_DEPARTMENT_OTHER): Payer: Self-pay

## 2023-10-29 MED ORDER — DESVENLAFAXINE SUCCINATE ER 50 MG PO TB24
50.0000 mg | ORAL_TABLET | Freq: Every day | ORAL | 3 refills | Status: DC
  Filled 2023-10-29: qty 90, 90d supply, fill #0
  Filled 2024-02-21: qty 90, 90d supply, fill #1
  Filled 2024-06-14: qty 90, 90d supply, fill #2
  Filled 2024-09-23: qty 90, 90d supply, fill #3

## 2023-11-01 ENCOUNTER — Encounter: Payer: Self-pay | Admitting: Neurology

## 2023-11-01 ENCOUNTER — Ambulatory Visit: Payer: Commercial Managed Care - PPO | Admitting: Neurology

## 2023-11-01 ENCOUNTER — Other Ambulatory Visit (HOSPITAL_BASED_OUTPATIENT_CLINIC_OR_DEPARTMENT_OTHER): Payer: Self-pay

## 2023-11-01 DIAGNOSIS — G43709 Chronic migraine without aura, not intractable, without status migrainosus: Secondary | ICD-10-CM | POA: Diagnosis not present

## 2023-11-01 DIAGNOSIS — Z85820 Personal history of malignant melanoma of skin: Secondary | ICD-10-CM | POA: Diagnosis not present

## 2023-11-01 DIAGNOSIS — L300 Nummular dermatitis: Secondary | ICD-10-CM | POA: Diagnosis not present

## 2023-11-01 DIAGNOSIS — D485 Neoplasm of uncertain behavior of skin: Secondary | ICD-10-CM | POA: Diagnosis not present

## 2023-11-01 DIAGNOSIS — M62838 Other muscle spasm: Secondary | ICD-10-CM

## 2023-11-01 DIAGNOSIS — D1801 Hemangioma of skin and subcutaneous tissue: Secondary | ICD-10-CM | POA: Diagnosis not present

## 2023-11-01 DIAGNOSIS — L814 Other melanin hyperpigmentation: Secondary | ICD-10-CM | POA: Diagnosis not present

## 2023-11-01 DIAGNOSIS — L570 Actinic keratosis: Secondary | ICD-10-CM | POA: Diagnosis not present

## 2023-11-01 DIAGNOSIS — D225 Melanocytic nevi of trunk: Secondary | ICD-10-CM | POA: Diagnosis not present

## 2023-11-01 DIAGNOSIS — M7918 Myalgia, other site: Secondary | ICD-10-CM

## 2023-11-01 DIAGNOSIS — L821 Other seborrheic keratosis: Secondary | ICD-10-CM | POA: Diagnosis not present

## 2023-11-01 MED ORDER — CYCLOBENZAPRINE HCL 10 MG PO TABS
10.0000 mg | ORAL_TABLET | Freq: Every day | ORAL | 3 refills | Status: DC
  Filled 2023-11-01: qty 90, 90d supply, fill #0

## 2023-11-01 MED ORDER — TRETINOIN 0.1 % EX CREA
1.0000 | TOPICAL_CREAM | Freq: Every evening | CUTANEOUS | 3 refills | Status: DC
  Filled 2023-11-01: qty 45, 30d supply, fill #0
  Filled 2024-01-12: qty 45, 30d supply, fill #1

## 2023-11-01 MED ORDER — METHOCARBAMOL 750 MG PO TABS
750.0000 mg | ORAL_TABLET | Freq: Three times a day (TID) | ORAL | 11 refills | Status: DC
  Filled 2023-11-01: qty 90, 30d supply, fill #0

## 2023-11-01 MED ORDER — ONABOTULINUMTOXINA 200 UNITS IJ SOLR
155.0000 [IU] | Freq: Once | INTRAMUSCULAR | Status: AC
  Administered 2023-11-01: 155 [IU] via INTRAMUSCULAR

## 2023-11-01 NOTE — Patient Instructions (Addendum)
Deborah York for neck pain  Muscle relaxers neck pain  [email or web address] for needling and massage  Prescribe emgality  Orders Placed This Encounter  Procedures   AMB referral to sports medicine   Meds ordered this encounter  Medications   botulinum toxin Type A (BOTOX) injection 155 Units    Botox- 200 units x 1 vial Lot: [id number] Expiration: 07/2025 NDC: 5621-3086-57  Bacteriostatic 0.9% Sodium Chloride- *4mL  Lot: [id number] Expiration: 02/18/2024 NDC: 9528-4132-44  Dx: W10.272 S/P  Witnessed by Leeann Must RN   cyclobenzaprine (FLEXERIL) 10 MG tablet    Sig: Take 1 tablet (10 mg total) by mouth at bedtime. Do not take with methocarbamol    Dispense:  90 tablet    Refill:  3   methocarbamol (ROBAXIN-750) 750 MG tablet    Sig: Take 1 tablet (750 mg total) by mouth 3 (three) times daily. Take during the day and do not take with flexeril at bedtime    Dispense:  90 tablet    Refill:  11   Galcanezumab-gnlm (EMGALITY) 120 MG/ML SOAJ    Sig: Inject 120 mg into the skin every 30 (thirty) days. PLEASE USE COPAY CARD: BIN [id number] PCN PDMI GROUP [id number] ID [id number] expires: 11/19/2023    Dispense:  1.12 mL    Refill:  11    PLEASE USE COPAY CARD: BIN [id number] PCN PDMI GROUP [id number] ID [id number] expires: 11/19/2023

## 2023-11-01 NOTE — Progress Notes (Unsigned)
Consent Form Botulism Toxin Injection For Chronic Migraine  November 01, 2023:ets greater than 50% improvement in Botox in headache frequency and severity however she still has a significant burden of migraines over the last 3 months 8 migraine days a month and greater than 15 total headache days a month will prescribe Emgality.  Terrilee Files for neck pain  Muscle relaxers neck pain  [email or web address] for needling and massage  Prescribe emgality Orders Placed This Encounter  Procedures   AMB referral to sports medicine   Meds ordered this encounter  Medications   botulinum toxin Type A (BOTOX) injection 155 Units    Botox- 200 units x 1 vial Lot: [id number] Expiration: 07/2025 NDC: 4540-9811-91  Bacteriostatic 0.9% Sodium Chloride- *4mL  Lot: [id number] Expiration: 02/18/2024 NDC: 6213-0865-78  Dx: I69.629 S/P  Witnessed by Leeann Must RN   cyclobenzaprine (FLEXERIL) 10 MG tablet    Sig: Take 1 tablet (10 mg total) by mouth at bedtime. Do not take with methocarbamol    Dispense:  90 tablet    Refill:  3   methocarbamol (ROBAXIN-750) 750 MG tablet    Sig: Take 1 tablet (750 mg total) by mouth 3 (three) times daily. Take during the day and do not take with flexeril at bedtime    Dispense:  90 tablet    Refill:  11   Galcanezumab-gnlm (EMGALITY) 120 MG/ML SOAJ    Sig: Inject 120 mg into the skin every 30 (thirty) days. PLEASE USE COPAY CARD: BIN [id number] PCN PDMI GROUP [id number] ID [id number] expires: 11/19/2023    Dispense:  1.12 mL    Refill:  11    PLEASE USE COPAY CARD: BIN [id number] PCN PDMI GROUP [id number] ID [id number] expires: 11/19/2023     08/02/2023: stable, doing extremely well if not better than before.  G6/05/2023: doing great, stable  01/16/2023: doing well. >> 50% improvement on botox in headache freq and severity and duraction! Now only 4-5 migraines a month will give nurtc for as needed, Only 4-5 migraine days a month and <6 total headache days a month. Failed imitrex,  rizatriptan.  No orders of the defined types were placed in this encounter.   09/09/2022: stable doing amazing >> 50% improvement on botox in headache freq and severity and duraction! Now only 4 migraines a month will give nurtc for as needed, Only 4 migraine days a month and 4 total headache days a month. Failed imitrex, rizatriptan.  06/19/2022: stable doing amazing! Now only 4 migraines a month with give nurtec for as needed  03/27/2022: doing great, 4 migraine days a month and they are mild and easily treatable. The wether made it a bit worse these last few months but >> 90% improvement.  01/02/2022: first botox. Baseline: daily headaches and 15-20 migraine days a month.   Reviewed orally with patient, additionally signature is on file:  Botulism toxin has been approved by the Federal drug administration for treatment of chronic migraine. Botulism toxin does not cure chronic migraine and it may not be effective in some patients.  The administration of botulism toxin is accomplished by injecting a small amount of toxin into the muscles of the neck and head. Dosage must be titrated for each individual. Any benefits resulting from botulism toxin tend to wear off after 3 months with a repeat injection required if benefit is to be maintained. Injections are usually done every 3-4 months with maximum effect peak achieved by about 2 or 3 weeks. Botulism toxin is expensive and you should  be sure of what costs you will incur resulting from the injection.  The side effects of botulism toxin use for chronic migraine may include:   -Transient, and usually mild, facial weakness with facial injections  -Transient, and usually mild, head or neck weakness with head/neck injections  -Reduction or loss of forehead facial animation due to forehead muscle weakness  -Eyelid drooping  -Dry eye  -Pain at the site of injection or bruising at the site of injection  -Double vision  -Potential unknown long term  risks  Contraindications: You should not have Botox if you are pregnant, nursing, allergic to albumin, have an infection, skin condition, or muscle weakness at the site of the injection, or have myasthenia gravis, Lambert-Eaton syndrome, or ALS.  It is also possible that as with any injection, there may be an allergic reaction or no effect from the medication. Reduced effectiveness after repeated injections is sometimes seen and rarely infection at the injection site may occur. All care will be taken to prevent these side effects. If therapy is given over a long time, atrophy and wasting in the muscle injected may occur. Occasionally the patient's become refractory to treatment because they develop antibodies to the toxin. In this event, therapy needs to be modified.  I have read the above information and consent to the administration of botulism toxin.    BOTOX PROCEDURE NOTE FOR MIGRAINE HEADACHE    Contraindications and precautions discussed with patient(above). Aseptic procedure was observed and patient tolerated procedure. Procedure performed by Dr. Artemio Aly  The condition has existed for more than 6 months, and pt does not have a diagnosis of ALS, Myasthenia Gravis or Lambert-Eaton Syndrome.  Risks and benefits of injections discussed and pt agrees to proceed with the procedure.  Written consent obtained  These injections are medically necessary. Pt  receives good benefits from these injections. These injections do not cause sedations or hallucinations which the oral therapies may cause.  Description of procedure:  The patient was placed in a sitting position. The standard protocol was used for Botox as follows, with 5 units of Botox injected at each site:   -Procerus muscle, midline injection  -Corrugator muscle, bilateral injection  -Frontalis muscle, bilateral injection, with 2 sites each side, medial injection was performed in the upper one third of the frontalis muscle, in  the region vertical from the medial inferior edge of the superior orbital rim. The lateral injection was again in the upper one third of the forehead vertically above the lateral limbus of the cornea, 1.5 cm lateral to the medial injection site.  -Temporalis muscle injection, 4 sites, bilaterally. The first injection was 3 cm above the tragus of the ear, second injection site was 1.5 cm to 3 cm up from the first injection site in line with the tragus of the ear. The third injection site was 1.5-3 cm forward between the first 2 injection sites. The fourth injection site was 1.5 cm posterior to the second injection site.   -Occipitalis muscle injection, 3 sites, bilaterally. The first injection was done one half way between the occipital protuberance and the tip of the mastoid process behind the ear. The second injection site was done lateral and superior to the first, 1 fingerbreadth from the first injection. The third injection site was 1 fingerbreadth superiorly and medially from the first injection site.  -Cervical paraspinal muscle injection, 2 sites, bilateral knee first injection site was 1 cm from the midline of the cervical spine, 3 cm inferior  to the lower border of the occipital protuberance. The second injection site was 1.5 cm superiorly and laterally to the first injection site.  -Trapezius muscle injection was performed at 3 sites, bilaterally. The first injection site was in the upper trapezius muscle halfway between the inflection point of the neck, and the acromion. The second injection site was one half way between the acromion and the first injection site. The third injection was done between the first injection site and the inflection point of the neck.   Will return for repeat injection in 3 months.   155 units of Botox was used, 45 Botox not injected was wasted. The patient tolerated the procedure well, there were no complications of the above procedure.

## 2023-11-01 NOTE — Progress Notes (Unsigned)
Botox- 200 units x 1 vial Lot: [id number] Expiration: 07/2025 NDC: 2536-6440-34  Bacteriostatic 0.9% Sodium Chloride- *4mL  Lot: [id number] Expiration: 02/18/2024 NDC: 6387-5643-32  Dx: R51.884 S/P  Witnessed by Leeann Must RN

## 2023-11-03 ENCOUNTER — Encounter: Payer: Self-pay | Admitting: Neurology

## 2023-11-03 MED ORDER — EMGALITY 120 MG/ML ~~LOC~~ SOAJ
120.0000 mg | SUBCUTANEOUS | 11 refills | Status: DC
  Filled 2023-11-03: qty 2, 28d supply, fill #0
  Filled 2024-01-12 – 2024-01-20 (×2): qty 1, 30d supply, fill #0
  Filled 2024-02-21: qty 1, 30d supply, fill #1
  Filled 2024-04-10: qty 1, 30d supply, fill #2
  Filled 2024-06-25: qty 1, 30d supply, fill #3
  Filled 2024-07-30: qty 1, 30d supply, fill #4

## 2023-11-04 ENCOUNTER — Other Ambulatory Visit: Payer: Self-pay

## 2023-11-04 ENCOUNTER — Other Ambulatory Visit (HOSPITAL_BASED_OUTPATIENT_CLINIC_OR_DEPARTMENT_OTHER): Payer: Self-pay

## 2023-11-05 ENCOUNTER — Other Ambulatory Visit (HOSPITAL_BASED_OUTPATIENT_CLINIC_OR_DEPARTMENT_OTHER): Payer: Self-pay

## 2023-11-07 ENCOUNTER — Other Ambulatory Visit (HOSPITAL_BASED_OUTPATIENT_CLINIC_OR_DEPARTMENT_OTHER): Payer: Self-pay

## 2023-11-08 ENCOUNTER — Other Ambulatory Visit (HOSPITAL_BASED_OUTPATIENT_CLINIC_OR_DEPARTMENT_OTHER): Payer: Self-pay

## 2023-11-11 ENCOUNTER — Other Ambulatory Visit (HOSPITAL_BASED_OUTPATIENT_CLINIC_OR_DEPARTMENT_OTHER): Payer: Self-pay

## 2023-11-12 ENCOUNTER — Other Ambulatory Visit (HOSPITAL_BASED_OUTPATIENT_CLINIC_OR_DEPARTMENT_OTHER): Payer: Self-pay

## 2023-11-12 ENCOUNTER — Other Ambulatory Visit: Payer: Self-pay

## 2023-11-15 DIAGNOSIS — R92323 Mammographic fibroglandular density, bilateral breasts: Secondary | ICD-10-CM | POA: Diagnosis not present

## 2023-11-15 DIAGNOSIS — Z1231 Encounter for screening mammogram for malignant neoplasm of breast: Secondary | ICD-10-CM | POA: Diagnosis not present

## 2023-11-15 LAB — HM MAMMOGRAPHY

## 2023-11-22 DIAGNOSIS — Z808 Family history of malignant neoplasm of other organs or systems: Secondary | ICD-10-CM | POA: Diagnosis not present

## 2023-11-22 DIAGNOSIS — Z806 Family history of leukemia: Secondary | ICD-10-CM | POA: Diagnosis not present

## 2023-11-22 DIAGNOSIS — R319 Hematuria, unspecified: Secondary | ICD-10-CM | POA: Diagnosis not present

## 2023-11-22 DIAGNOSIS — Z803 Family history of malignant neoplasm of breast: Secondary | ICD-10-CM | POA: Diagnosis not present

## 2023-11-22 DIAGNOSIS — R32 Unspecified urinary incontinence: Secondary | ICD-10-CM | POA: Diagnosis not present

## 2023-11-22 DIAGNOSIS — Z800 Family history of malignant neoplasm of digestive organs: Secondary | ICD-10-CM | POA: Diagnosis not present

## 2023-11-22 DIAGNOSIS — Z8049 Family history of malignant neoplasm of other genital organs: Secondary | ICD-10-CM | POA: Diagnosis not present

## 2023-11-22 DIAGNOSIS — Z85820 Personal history of malignant melanoma of skin: Secondary | ICD-10-CM | POA: Diagnosis not present

## 2023-11-26 ENCOUNTER — Other Ambulatory Visit (HOSPITAL_BASED_OUTPATIENT_CLINIC_OR_DEPARTMENT_OTHER): Payer: Self-pay

## 2023-11-26 MED ORDER — NITROFURANTOIN MONOHYD MACRO 100 MG PO CAPS
100.0000 mg | ORAL_CAPSULE | Freq: Two times a day (BID) | ORAL | 0 refills | Status: DC
  Filled 2023-11-26: qty 14, 7d supply, fill #0

## 2023-12-04 ENCOUNTER — Other Ambulatory Visit (HOSPITAL_COMMUNITY): Payer: Self-pay

## 2023-12-06 ENCOUNTER — Encounter: Payer: Commercial Managed Care - PPO | Admitting: Family Medicine

## 2023-12-06 ENCOUNTER — Other Ambulatory Visit: Payer: Self-pay | Admitting: Family Medicine

## 2023-12-06 DIAGNOSIS — R319 Hematuria, unspecified: Secondary | ICD-10-CM | POA: Diagnosis not present

## 2023-12-06 DIAGNOSIS — R92322 Mammographic fibroglandular density, left breast: Secondary | ICD-10-CM | POA: Diagnosis not present

## 2023-12-06 DIAGNOSIS — R32 Unspecified urinary incontinence: Secondary | ICD-10-CM | POA: Diagnosis not present

## 2023-12-06 DIAGNOSIS — R928 Other abnormal and inconclusive findings on diagnostic imaging of breast: Secondary | ICD-10-CM | POA: Diagnosis not present

## 2023-12-09 ENCOUNTER — Other Ambulatory Visit: Payer: Self-pay

## 2023-12-10 ENCOUNTER — Other Ambulatory Visit (HOSPITAL_BASED_OUTPATIENT_CLINIC_OR_DEPARTMENT_OTHER): Payer: Self-pay

## 2023-12-10 MED ORDER — ROSUVASTATIN CALCIUM 10 MG PO TABS
ORAL_TABLET | ORAL | 3 refills | Status: AC
  Filled 2023-12-10: qty 36, 90d supply, fill #0
  Filled 2024-03-23: qty 36, 90d supply, fill #1
  Filled 2024-09-23: qty 36, 90d supply, fill #2

## 2023-12-12 ENCOUNTER — Other Ambulatory Visit (HOSPITAL_BASED_OUTPATIENT_CLINIC_OR_DEPARTMENT_OTHER): Payer: Self-pay

## 2023-12-12 MED ORDER — CIPROFLOXACIN HCL 500 MG PO TABS
500.0000 mg | ORAL_TABLET | Freq: Two times a day (BID) | ORAL | 0 refills | Status: DC
  Filled 2023-12-12: qty 14, 7d supply, fill #0

## 2023-12-13 ENCOUNTER — Other Ambulatory Visit (HOSPITAL_BASED_OUTPATIENT_CLINIC_OR_DEPARTMENT_OTHER): Payer: Self-pay

## 2023-12-13 ENCOUNTER — Encounter: Payer: Self-pay | Admitting: Family Medicine

## 2023-12-13 ENCOUNTER — Ambulatory Visit (INDEPENDENT_AMBULATORY_CARE_PROVIDER_SITE_OTHER): Payer: Commercial Managed Care - PPO | Admitting: Family Medicine

## 2023-12-13 VITALS — BP 105/67 | HR 77 | Ht 67.52 in | Wt 140.5 lb

## 2023-12-13 DIAGNOSIS — B952 Enterococcus as the cause of diseases classified elsewhere: Secondary | ICD-10-CM

## 2023-12-13 DIAGNOSIS — Z0000 Encounter for general adult medical examination without abnormal findings: Secondary | ICD-10-CM | POA: Diagnosis not present

## 2023-12-13 DIAGNOSIS — I10 Essential (primary) hypertension: Secondary | ICD-10-CM

## 2023-12-13 DIAGNOSIS — N390 Urinary tract infection, site not specified: Secondary | ICD-10-CM

## 2023-12-13 MED ORDER — FLUCONAZOLE 150 MG PO TABS
150.0000 mg | ORAL_TABLET | Freq: Once | ORAL | 2 refills | Status: AC
  Filled 2023-12-13: qty 2, 2d supply, fill #0

## 2023-12-13 NOTE — Assessment & Plan Note (Addendum)
Well adult Orders Placed This Encounter  Procedures   Urine Culture   CMP14+EGFR   CBC with Differential/Platelet   Lipid Panel With LDL/HDL Ratio   TSH + free T4   Iron, TIBC and Ferritin Panel   Vitamin D (25 hydroxy)  Screenings: Up-to-date Immunizations: Per orders Anticipatory guidance/risk factor reduction: Recommendations per AVS.

## 2023-12-13 NOTE — Patient Instructions (Signed)
Preventive Care 87 Years and Older, Female Preventive care refers to lifestyle choices and visits with your health care provider that can promote health and wellness. Preventive care visits are also called wellness exams. What can I expect for my preventive care visit? Counseling Your health care provider may ask you questions about your: Medical history, including: Past medical problems. Family medical history. Pregnancy and menstrual history. History of falls. Current health, including: Memory and ability to understand (cognition). Emotional well-being. Home life and relationship well-being. Sexual activity and sexual health. Lifestyle, including: Alcohol, nicotine or tobacco, and drug use. Access to firearms. Diet, exercise, and sleep habits. Work and work Astronomer. Sunscreen use. Safety issues such as seatbelt and bike helmet use. Physical exam Your health care provider will check your: Height and weight. These may be used to calculate your BMI (body mass index). BMI is a measurement that tells if you are at a healthy weight. Waist circumference. This measures the distance around your waistline. This measurement also tells if you are at a healthy weight and may help predict your risk of certain diseases, such as type 2 diabetes and high blood pressure. Heart rate and blood pressure. Body temperature. Skin for abnormal spots. What immunizations do I need?  Vaccines are usually given at various ages, according to a schedule. Your health care provider will recommend vaccines for you based on your age, medical history, and lifestyle or other factors, such as travel or where you work. What tests do I need? Screening Your health care provider may recommend screening tests for certain conditions. This may include: Lipid and cholesterol levels. Hepatitis C test. Hepatitis B test. HIV (human immunodeficiency virus) test. STI (sexually transmitted infection) testing, if you are at  risk. Lung cancer screening. Colorectal cancer screening. Diabetes screening. This is done by checking your blood sugar (glucose) after you have not eaten for a while (fasting). Mammogram. Talk with your health care provider about how often you should have regular mammograms. BRCA-related cancer screening. This may be done if you have a family history of breast, ovarian, tubal, or peritoneal cancers. Bone density scan. This is done to screen for osteoporosis. Talk with your health care provider about your test results, treatment options, and if necessary, the need for more tests. Follow these instructions at home: Eating and drinking  Eat a diet that includes fresh fruits and vegetables, whole grains, lean protein, and low-fat dairy products. Limit your intake of foods with high amounts of sugar, saturated fats, and salt. Take vitamin and mineral supplements as recommended by your health care provider. Do not drink alcohol if your health care provider tells you not to drink. If you drink alcohol: Limit how much you have to 0-1 drink a day. Know how much alcohol is in your drink. In the [place] one drink equals one 12 oz bottle of beer (355 mL), one 5 oz glass of wine (148 mL), or one 1 oz glass of hard liquor (44 mL). Lifestyle Brush your teeth every morning and night with fluoride toothpaste. Floss one time each day. Exercise for at least 30 minutes 5 or more days each week. Do not use any products that contain nicotine or tobacco. These products include cigarettes, chewing tobacco, and vaping devices, such as e-cigarettes. If you need help quitting, ask your health care provider. Do not use drugs. If you are sexually active, practice safe sex. Use a condom or other form of protection in order to prevent STIs. Take aspirin only as told by  your health care provider. Make sure that you understand how much to take and what form to take. Work with your health care provider to find out whether it  is safe and beneficial for you to take aspirin daily. Ask your health care provider if you need to take a cholesterol-lowering medicine (statin). Find healthy ways to manage stress, such as: Meditation, yoga, or listening to music. Journaling. Talking to a trusted person. Spending time with friends and family. Minimize exposure to UV radiation to reduce your risk of skin cancer. Safety Always wear your seat belt while driving or riding in a vehicle. Do not drive: If you have been drinking alcohol. Do not ride with someone who has been drinking. When you are tired or distracted. While texting. If you have been using any mind-altering substances or drugs. Wear a helmet and other protective equipment during sports activities. If you have firearms in your house, make sure you follow all gun safety procedures. What's next? Visit your health care provider once a year for an annual wellness visit. Ask your health care provider how often you should have your eyes and teeth checked. Stay up to date on all vaccines. This information is not intended to replace advice given to you by your health care provider. Make sure you discuss any questions you have with your health care provider. Document Revised: 05/03/2021 Document Reviewed: 05/03/2021 Elsevier Patient Education  2024 [employer].

## 2023-12-13 NOTE — Progress Notes (Signed)
Deborah York - 68 y.o. female MRN [id number]  Date of birth: 1956-10-27  Subjective Chief Complaint  Patient presents with   Annual Exam    HPI Deborah York is a 68 y.o. female here today for annual exam.   She reports that she is doing well. She denies new concerns.  Chronic conditions are stable with current medications.   She continues to stay pretty active and feels that her diet remains healthy.   She is a non-smoker.  No EtOH at this time.   She does have regular dental care.   Review of Systems  Constitutional:  Negative for chills, fever, malaise/fatigue and weight loss.  HENT:  Negative for congestion, ear pain and sore throat.   Eyes:  Negative for blurred vision, double vision and pain.  Respiratory:  Negative for cough and shortness of breath.   Cardiovascular:  Negative for chest pain and palpitations.  Gastrointestinal:  Negative for abdominal pain, blood in stool, constipation, heartburn and nausea.  Genitourinary:  Negative for dysuria and urgency.  Musculoskeletal:  Negative for joint pain and myalgias.  Neurological:  Negative for dizziness and headaches.  Endo/Heme/Allergies:  Does not bruise/bleed easily.  Psychiatric/Behavioral:  Negative for depression. The patient is not nervous/anxious and does not have insomnia.     Allergies  Allergen Reactions   Sulfa Antibiotics Rash    Past Medical History:  Diagnosis Date   Complication of anesthesia    Hypertension    Migraines    PONV (postoperative nausea and vomiting)     Past Surgical History:  Procedure Laterality Date   IRRIGATION AND DEBRIDEMENT SHOULDER Right 03/23/2021   Procedure: IRRIGATION AND DEBRIDEMENT SHOULDER;  Surgeon: Bjorn Pippin, MD;  Location: [facility];  Service: Orthopedics;  Laterality: Right;   LYSIS OF ADHESION Right 03/23/2021   Procedure: LYSIS OF ADHESION WITH MANIPULATION;  Surgeon: Bjorn Pippin, MD;  Location: [facility];  Service:  Orthopedics;  Laterality: Right;   SHOULDER ACROMIOPLASTY Right 03/23/2021   Procedure: SHOULDER ACROMIOPLASTY;  Surgeon: Bjorn Pippin, MD;  Location: [facility];  Service: Orthopedics;  Laterality: Right;   SHOULDER ARTHROSCOPY WITH DISTAL CLAVICLE RESECTION Right 03/23/2021   Procedure: SHOULDER ARTHROSCOPY WITH DISTAL CLAVICLE RESECTION;  Surgeon: Bjorn Pippin, MD;  Location: [facility];  Service: Orthopedics;  Laterality: Right;   TOE SURGERY      Social History   Socioeconomic History   Marital status: Married    Spouse name: Not on file   Number of children: Not on file   Years of education: Not on file   Highest education level: Not on file  Occupational History   Not on file  Tobacco Use   Smoking status: Former   Smokeless tobacco: Former  Advertising account planner   Vaping status: Never Used  Substance and Sexual Activity   Alcohol use: Not Currently   Drug use: Never   Sexual activity: Not on file  Other Topics Concern   Not on file  Social History Narrative   Caffeine- one cup daily, diet coke daily.  Education: Family MD (healthy weight wellness).      Social Drivers of Corporate investment banker Strain: Not on file  Food Insecurity: Not on file  Transportation Needs: Not on file  Physical Activity: Not on file  Stress: Not on file  Social Connections: Unknown (04/01/2022)   Received from [facility]   Social Network  Social Network: Not on file    Family History  Problem Relation Age of Onset   Cancer Mother    Migraines Mother    Hypertension Mother    Heart failure Father    Migraines Maternal Grandmother    Migraines Other     Health Maintenance  Topic Date Due   Hepatitis C Screening  Never done   COVID-19 Vaccine (6 - 2024-25 season) 07/21/2023   INFLUENZA VACCINE  02/17/2024 (Originally 06/20/2023)   MAMMOGRAM  11/14/2024   Colonoscopy  02/18/2031   DTaP/Tdap/Td (3 - Td or Tdap) 11/30/2032   Pneumonia  Vaccine 38+ Years old  Completed   DEXA SCAN  Completed   Zoster Vaccines- Shingrix  Completed   HPV VACCINES  Aged Out     ----------------------------------------------------------------------------------------------------------------------------------------------------------------------------------------------------------------- Physical Exam BP 105/67 (BP Location: Left Arm, Patient Position: Sitting, Cuff Size: Normal)   Pulse 77   Ht 5' 7.52" (1.715 m)   Wt 140 lb 8 oz (63.7 kg)   SpO2 100%   BMI 21.67 kg/m   Physical Exam Constitutional:      General: She is not in acute distress. HENT:     Head: Normocephalic and atraumatic.     Right Ear: Tympanic membrane and ear canal normal.     Left Ear: Tympanic membrane and ear canal normal.     Nose: Nose normal.  Eyes:     General: No scleral icterus.    Conjunctiva/sclera: Conjunctivae normal.  Neck:     Thyroid: No thyromegaly.  Cardiovascular:     Rate and Rhythm: Normal rate and regular rhythm.     Heart sounds: Normal heart sounds.  Pulmonary:     Effort: Pulmonary effort is normal.     Breath sounds: Normal breath sounds.  Abdominal:     General: Bowel sounds are normal. There is no distension.     Palpations: Abdomen is soft.     Tenderness: There is no abdominal tenderness. There is no guarding.  Musculoskeletal:        General: Normal range of motion.     Cervical back: Normal range of motion and neck supple.  Lymphadenopathy:     Cervical: No cervical adenopathy.  Skin:    General: Skin is warm and dry.     Findings: No rash.  Neurological:     General: No focal deficit present.     Mental Status: She is alert and oriented to person, place, and time.     Cranial Nerves: No cranial nerve deficit.     Coordination: Coordination normal.  Psychiatric:        Mood and Affect: Mood normal.        Behavior: Behavior normal.      ------------------------------------------------------------------------------------------------------------------------------------------------------------------------------------------------------------------- Assessment and Plan  Well adult exam Well adult Recent labs reviewed with her. No orders of the defined types were placed in this encounter. Screenings: Up-to-date Immunizations: Per orders Anticipatory guidance/risk factor reduction: Recommendations per AVS.   Meds ordered this encounter  Medications   fluconazole (DIFLUCAN) 150 MG tablet    Sig: Take 1 tablet (150 mg total) by mouth once for 1 dose. May repeat after 72 hours if needed.    Dispense:  2 tablet    Refill:  2    No follow-ups on file.    This visit occurred during the SARS-CoV-2 public health emergency.  Safety protocols were in place, including screening questions prior to the visit, additional usage of staff PPE, and extensive cleaning of exam  room while observing appropriate contact time as indicated for disinfecting solutions.

## 2023-12-26 NOTE — Progress Notes (Signed)
 [facility] [address] [place] [address] Phone: [phone] Subjective:    I'm seeing this patient by the request  of:  Alvia Velma ROSALEA Ines MD  CC: neck pain   Deborah York  Deborah York is a 68 y.o. female coming in with complaint of neck pain. Patient states that she fractured humerus 3 years ago and has had neck pain since then. Pain worse on L side. Tried PT and dry needling. Stretches and uses TENS unit at home. All motions seem limited.        Past Medical History:  Diagnosis Date   Complication of anesthesia    Hypertension    Migraines    PONV (postoperative nausea and vomiting)    Past Surgical History:  Procedure Laterality Date   IRRIGATION AND DEBRIDEMENT SHOULDER Right 03/23/2021   Procedure: IRRIGATION AND DEBRIDEMENT SHOULDER;  Surgeon: Deborah Bonner DASEN, MD;  Location: [facility];  Service: Orthopedics;  Laterality: Right;   LYSIS OF ADHESION Right 03/23/2021   Procedure: LYSIS OF ADHESION WITH MANIPULATION;  Surgeon: Deborah Bonner DASEN, MD;  Location: [facility];  Service: Orthopedics;  Laterality: Right;   SHOULDER ACROMIOPLASTY Right 03/23/2021   Procedure: SHOULDER ACROMIOPLASTY;  Surgeon: Deborah Bonner DASEN, MD;  Location: [facility];  Service: Orthopedics;  Laterality: Right;   SHOULDER ARTHROSCOPY WITH DISTAL CLAVICLE RESECTION Right 03/23/2021   Procedure: SHOULDER ARTHROSCOPY WITH DISTAL CLAVICLE RESECTION;  Surgeon: Deborah Bonner DASEN, MD;  Location: [facility];  Service: Orthopedics;  Laterality: Right;   TOE SURGERY     Social History   Socioeconomic History   Marital status: Married    Spouse name: Not on file   Number of children: Not on file   Years of education: Not on file   Highest education level: Not on file  Occupational History   Not on file  Tobacco Use   Smoking status: Former   Smokeless tobacco: Former  Advertising Account Planner   Vaping status: Never Used   Substance and Sexual Activity   Alcohol use: Not Currently   Drug use: Never   Sexual activity: Not on file  Other Topics Concern   Not on file  Social History Narrative   Caffeine- one cup daily, diet coke daily.  Education: Family MD (healthy weight wellness).      Social Drivers of Corporate Investment Banker Strain: Not on file  Food Insecurity: Not on file  Transportation Needs: Not on file  Physical Activity: Not on file  Stress: Not on file  Social Connections: Unknown (04/01/2022)   Received from Deborah York, [facility]   Social Network    Social Network: Not on file   Allergies  Allergen Reactions   Sulfa Antibiotics Rash   Family History  Problem Relation Age of Onset   Cancer Mother    Migraines Mother    Hypertension Mother    Heart failure Father    Migraines Maternal Grandmother    Migraines Other     Current Outpatient Medications (Endocrine & Metabolic):    norethindrone -ethinyl estradiol  (FEMHRT  1/5) 1-5 MG-MCG TABS tablet, Take 1 tablet by mouth daily.  Current Outpatient Medications (Cardiovascular):    rosuvastatin  (CRESTOR ) 10 MG tablet, Take on Monday, Wednesday, Friday   spironolactone  (ALDACTONE ) 100 MG tablet, Take 1 tablet (100 mg total) by mouth daily.   telmisartan  (MICARDIS ) 20 MG tablet, Take 1 tablet by mouth daily   Current Outpatient Medications (  Analgesics):    Galcanezumab -gnlm (EMGALITY ) 120 MG/ML SOAJ, Inject 120 mg into the skin every 30 (thirty) days.   Rimegepant Sulfate  (NURTEC) 75 MG TBDP, Take 1 tablet (75 mg total) by mouth daily as needed. For migraines. Take as close to onset of migraine as possible. One daily maximum. 4-5 migraines a month and < 6 total headache days a month. will give nurtc for as needed, Failed imitrex , rizatriptan AND ubrelvy    Current Outpatient Medications (Other):    botulinum toxin Type A  (BOTOX ) 200 units injection, Provider to inject 155 units into the muscles of the head and neck every 12  weeks. Discard remainder.   CALCIUM  PO, Take by mouth.   ciprofloxacin  (CIPRO ) 500 MG tablet, Take 1 tablet (500 mg total) by mouth 2 (two) times daily for 7 days.   clindamycin  (CLEOCIN  T) 1 % external solution, Apply topically 2 (two) times daily.   desvenlafaxine  (PRISTIQ ) 50 MG 24 hr tablet, Take 1 tablet (50 mg total) by mouth daily.   gabapentin  (NEURONTIN ) 600 MG tablet, Take 1 tablet (600 mg total) by mouth at bedtime as needed.   mirabegron  ER (MYRBETRIQ ) 25 MG TB24 tablet, Take 1 tablet (25 mg total) by mouth daily.   ondansetron  (ZOFRAN -ODT) 4 MG disintegrating tablet, Take 1 tablet (4 mg total) by mouth every 8 (eight) hours as needed for nausea or vomiting.   Prasterone  (INTRAROSA ) 6.5 MG INST, Place 6.5 mg vaginally daily.   tretinoin  (RETIN-A ) 0.1 % cream, Apply a pea size amount to face every night at bedtime   VITAMIN D  PO, Take by mouth.   methylphenidate  (RITALIN ) 10 MG tablet, Take 1 tablet (10 mg total) by mouth 3 (three) times daily with meals.   Reviewed prior external information including notes and imaging from  primary care provider As well as notes that were available from care everywhere and other healthcare systems.  Past medical history, social, surgical and family history all reviewed in electronic medical record.  No pertanent information unless stated regarding to the chief complaint.   Review of Systems:  No headache, visual changes, nausea, vomiting, diarrhea, constipation, dizziness, abdominal pain, skin rash, fevers, chills, night sweats, weight loss, swollen lymph nodes, body aches, joint swelling, chest pain, shortness of breath, mood changes. POSITIVE muscle aches  Objective  Blood pressure 124/82, pulse 90, height 5' 7 (1.702 m), SpO2 96%.   General: No apparent distress alert and oriented x3 mood and affect normal, dressed appropriately.  HEENT: Pupils equal, extraocular movements intact  Respiratory: Patient's speak in full sentences and does  not appear short of breath  Cardiovascular: No lower extremity edema, non tender, no erythema  Neck exam shows patient does have some loss lordosis noted.  Some tenderness to palpation in the paraspinal musculature.  Patient does have significant tightness noted in the levator scapula a as well as in the spinalis muscles.  Left-sided scapular dyskinesis noted with inferior winging noted.   Osteopathic findings C2 flexed rotated and side bent right C4 flexed rotated and side bent left C6 flexed rotated and side bent left T5 extended rotated and side bent left inhaled third rib T9 extended rotated and side bent left    Scapular dyskinesis Left-sided.  Discussed icing regimen and home exercises, discussed which activities to do and which ones to avoid.  Increase activity slowly otherwise.  We discussed other ergonomics that patient can make changes.  Responded extremely well to osteopathic manipulation.  Follow-up again in 6 to 8 weeks note given  for adjustable standing desk    Decision today to treat with OMT was based on Physical Exam  After verbal consent patient was treated with HVLA, ME, FPR techniques in cervical, thoracic, rib, areas, all areas are chronic   Patient tolerated the procedure well with improvement in symptoms  Patient given exercises, stretches and lifestyle modifications  See medications in patient instructions if given  Patient will follow up in 6-8 weeks  The above documentation has been reviewed and is accurate and complete Gao Mitnick M Shiquan Mathieu, DO

## 2023-12-27 ENCOUNTER — Encounter: Payer: Self-pay | Admitting: Family Medicine

## 2023-12-27 ENCOUNTER — Ambulatory Visit: Payer: Commercial Managed Care - PPO | Admitting: Family Medicine

## 2023-12-27 VITALS — BP 124/82 | HR 90 | Ht 67.0 in

## 2023-12-27 DIAGNOSIS — G2589 Other specified extrapyramidal and movement disorders: Secondary | ICD-10-CM

## 2023-12-27 DIAGNOSIS — M9902 Segmental and somatic dysfunction of thoracic region: Secondary | ICD-10-CM

## 2023-12-27 DIAGNOSIS — M9908 Segmental and somatic dysfunction of rib cage: Secondary | ICD-10-CM

## 2023-12-27 DIAGNOSIS — M9901 Segmental and somatic dysfunction of cervical region: Secondary | ICD-10-CM

## 2023-12-27 NOTE — Patient Instructions (Signed)
 Scapular exercises Note for adjustable standing desk See me again in 6 weeks

## 2023-12-27 NOTE — Assessment & Plan Note (Signed)
 Left-sided.  Discussed icing regimen and home exercises, discussed which activities to do and which ones to avoid.  Increase activity slowly otherwise.  We discussed other ergonomics that patient can make changes.  Responded extremely well to osteopathic manipulation.  Follow-up again in 6 to 8 weeks note given for adjustable standing desk

## 2024-01-10 ENCOUNTER — Other Ambulatory Visit: Payer: Self-pay | Admitting: Family Medicine

## 2024-01-10 DIAGNOSIS — E611 Iron deficiency: Secondary | ICD-10-CM

## 2024-01-10 DIAGNOSIS — I10 Essential (primary) hypertension: Secondary | ICD-10-CM | POA: Diagnosis not present

## 2024-01-10 DIAGNOSIS — N390 Urinary tract infection, site not specified: Secondary | ICD-10-CM | POA: Diagnosis not present

## 2024-01-10 DIAGNOSIS — B952 Enterococcus as the cause of diseases classified elsewhere: Secondary | ICD-10-CM | POA: Diagnosis not present

## 2024-01-10 DIAGNOSIS — Z0000 Encounter for general adult medical examination without abnormal findings: Secondary | ICD-10-CM | POA: Diagnosis not present

## 2024-01-11 LAB — LIPID PANEL WITH LDL/HDL RATIO
Cholesterol, Total: 150 mg/dL (ref 100–199)
HDL: 58 mg/dL (ref >39)
LDL Chol Calc (NIH): 74 mg/dL (ref 0–99)
LDL/HDL Ratio: 1.3 ratio (ref 0.0–3.2)
Triglycerides: 97 mg/dL (ref 0–149)
VLDL Cholesterol Cal: 18 mg/dL (ref 5–40)

## 2024-01-11 LAB — VITAMIN D 25 HYDROXY (VIT D DEFICIENCY, FRACTURES): Vit D, 25-Hydroxy: 53.7 ng/mL (ref 30.0–100.0)

## 2024-01-11 LAB — CBC WITH DIFFERENTIAL/PLATELET
Basophils Absolute: 0.1 10*3/uL (ref 0.0–0.2)
Basos: 1 %
EOS (ABSOLUTE): 0 10*3/uL (ref 0.0–0.4)
Eos: 1 %
Hematocrit: 44.3 % (ref 34.0–46.6)
Hemoglobin: 14.3 g/dL (ref 11.1–15.9)
Immature Grans (Abs): 0 10*3/uL (ref 0.0–0.1)
Immature Granulocytes: 0 %
Lymphocytes Absolute: 2.4 10*3/uL (ref 0.7–3.1)
Lymphs: 33 %
MCH: 30 pg (ref 26.6–33.0)
MCHC: 32.3 g/dL (ref 31.5–35.7)
MCV: 93 fL (ref 79–97)
Monocytes Absolute: 0.7 10*3/uL (ref 0.1–0.9)
Monocytes: 9 %
Neutrophils Absolute: 4.3 10*3/uL (ref 1.4–7.0)
Neutrophils: 56 %
Platelets: 415 10*3/uL (ref 150–450)
RBC: 4.76 x10E6/uL (ref 3.77–5.28)
RDW: 12.2 % (ref 11.7–15.4)
WBC: 7.4 10*3/uL (ref 3.4–10.8)

## 2024-01-11 LAB — IRON,TIBC AND FERRITIN PANEL
Ferritin: 37 ng/mL (ref 15–150)
Iron Saturation: 33 % (ref 15–55)
Iron: 126 ug/dL (ref 27–139)
Total Iron Binding Capacity: 383 ug/dL (ref 250–450)
UIBC: 257 ug/dL (ref 118–369)

## 2024-01-11 LAB — CMP14+EGFR
ALT: 14 IU/L (ref 0–32)
AST: 18 IU/L (ref 0–40)
Albumin: 4 g/dL (ref 3.9–4.9)
Alkaline Phosphatase: 71 IU/L (ref 44–121)
BUN/Creatinine Ratio: 20 (ref 12–28)
BUN: 13 mg/dL (ref 8–27)
Bilirubin Total: 0.5 mg/dL (ref 0.0–1.2)
CO2: 23 mmol/L (ref 20–29)
Calcium: 9.4 mg/dL (ref 8.7–10.3)
Chloride: 101 mmol/L (ref 96–106)
Creatinine, Ser: 0.66 mg/dL (ref 0.57–1.00)
Globulin, Total: 2.6 g/dL (ref 1.5–4.5)
Glucose: 79 mg/dL (ref 70–99)
Potassium: 4.4 mmol/L (ref 3.5–5.2)
Sodium: 140 mmol/L (ref 134–144)
Total Protein: 6.6 g/dL (ref 6.0–8.5)
eGFR: 96 mL/min/{1.73_m2} (ref >59)

## 2024-01-11 LAB — TSH+FREE T4
Free T4: 1.25 ng/dL (ref 0.82–1.77)
TSH: 3.8 u[IU]/mL (ref 0.450–4.500)

## 2024-01-12 ENCOUNTER — Other Ambulatory Visit: Payer: Self-pay | Admitting: Family Medicine

## 2024-01-12 DIAGNOSIS — F900 Attention-deficit hyperactivity disorder, predominantly inattentive type: Secondary | ICD-10-CM

## 2024-01-12 LAB — URINE CULTURE

## 2024-01-13 ENCOUNTER — Other Ambulatory Visit: Payer: Self-pay

## 2024-01-13 ENCOUNTER — Other Ambulatory Visit (HOSPITAL_BASED_OUTPATIENT_CLINIC_OR_DEPARTMENT_OTHER): Payer: Self-pay

## 2024-01-13 MED ORDER — METHYLPHENIDATE HCL 10 MG PO TABS
10.0000 mg | ORAL_TABLET | Freq: Three times a day (TID) | ORAL | 0 refills | Status: DC
  Filled 2024-01-13: qty 270, 90d supply, fill #0

## 2024-01-13 MED ORDER — TELMISARTAN 20 MG PO TABS
20.0000 mg | ORAL_TABLET | Freq: Every day | ORAL | 3 refills | Status: AC
  Filled 2024-01-13: qty 90, 90d supply, fill #0
  Filled 2024-09-23: qty 90, 90d supply, fill #1

## 2024-01-14 ENCOUNTER — Other Ambulatory Visit (HOSPITAL_BASED_OUTPATIENT_CLINIC_OR_DEPARTMENT_OTHER): Payer: Self-pay

## 2024-01-14 ENCOUNTER — Encounter: Payer: Self-pay | Admitting: Family Medicine

## 2024-01-15 ENCOUNTER — Telehealth: Payer: Self-pay

## 2024-01-15 NOTE — Telephone Encounter (Signed)
*  [facility]  Pharmacy Patient Advocate Encounter   Received notification from CoverMyMeds that prior authorization for Emgality 120MG /ML auto-injectors (migraine)  is required/requested.   Insurance verification completed.   The patient is insured through [facility] .   Per test claim: PA required; PA submitted to above mentioned insurance via CoverMyMeds Key/confirmation #/EOC [id number] Status is pending

## 2024-01-16 ENCOUNTER — Other Ambulatory Visit (HOSPITAL_BASED_OUTPATIENT_CLINIC_OR_DEPARTMENT_OTHER): Payer: Self-pay

## 2024-01-20 ENCOUNTER — Other Ambulatory Visit (HOSPITAL_BASED_OUTPATIENT_CLINIC_OR_DEPARTMENT_OTHER): Payer: Self-pay

## 2024-01-20 ENCOUNTER — Other Ambulatory Visit (HOSPITAL_COMMUNITY): Payer: Self-pay

## 2024-01-20 ENCOUNTER — Other Ambulatory Visit: Payer: Self-pay | Admitting: Neurology

## 2024-01-20 ENCOUNTER — Other Ambulatory Visit: Payer: Self-pay

## 2024-01-20 ENCOUNTER — Encounter (HOSPITAL_BASED_OUTPATIENT_CLINIC_OR_DEPARTMENT_OTHER): Payer: Self-pay

## 2024-01-20 DIAGNOSIS — G43709 Chronic migraine without aura, not intractable, without status migrainosus: Secondary | ICD-10-CM

## 2024-01-20 MED ORDER — BOTOX 200 UNITS IJ SOLR
INTRAMUSCULAR | 3 refills | Status: AC
  Filled 2024-01-20: qty 1, 84d supply, fill #0
  Filled 2024-04-03: qty 1, 84d supply, fill #1
  Filled 2024-08-03 – 2024-08-14 (×4): qty 1, 84d supply, fill #2
  Filled 2024-10-30: qty 1, 84d supply, fill #3

## 2024-01-20 NOTE — Telephone Encounter (Signed)
 Pharmacy Patient Advocate Encounter  Received notification from [facility] that Prior Authorization for Emgality 120MG /ML auto-injectors (migraine)  has been APPROVED from 01/17/2024 to 02/16/2024. Ran test claim, Copay is $35.00. This test claim was processed through [facility]- copay amounts may vary at other pharmacies due to pharmacy/plan contracts, or as the patient moves through the different stages of their insurance plan.   The request has been approved. The authorization is effective from 01/17/2024 to 02/16/2024, as long as the member is enrolled in their current health plan. The request was approved as submitted. First prior authorization [id number] (as listed above) has been approved for the loading (initial) dose with a quantity limit of 2mL per 30 days. A second prior authorization [id number] has been entered for the maintenance dose of Emgality 120mg /mL pen with a quantity limit of 1mL (one pen) per 30 days that is effective from 02/09/2024 and is valid until 07/08/2024.  *patient notified from office

## 2024-01-20 NOTE — Progress Notes (Signed)
 Specialty Pharmacy Refill Coordination Note  Deborah York is a 68 y.o. female assessed today regarding refills of clinic administered specialty medication(s) OnabotulinumtoxinA (BOTOX)   Clinic requested Courier to Provider Office   Delivery date: 01/21/24   Verified address: [facility] [address] [place] [address]   Medication will be filled on 01/20/24.

## 2024-01-24 ENCOUNTER — Ambulatory Visit: Payer: Commercial Managed Care - PPO | Admitting: Neurology

## 2024-01-24 VITALS — BP 105/65

## 2024-01-24 DIAGNOSIS — G43009 Migraine without aura, not intractable, without status migrainosus: Secondary | ICD-10-CM

## 2024-01-24 DIAGNOSIS — G43709 Chronic migraine without aura, not intractable, without status migrainosus: Secondary | ICD-10-CM | POA: Diagnosis not present

## 2024-01-24 MED ORDER — ONABOTULINUMTOXINA 200 UNITS IJ SOLR
155.0000 [IU] | Freq: Once | INTRAMUSCULAR | Status: AC
  Administered 2024-01-24: 155 [IU] via INTRAMUSCULAR

## 2024-01-24 NOTE — Progress Notes (Signed)
 Consent Form Botulism Toxin Injection For Chronic Migraine  01/24/2024: Doing amazing on Botox(greater than 50% improvement in Botox in headache frequency and severity) and remainder on Emgality. Now only 4 mild migraines a month and < 6 total headache days a month. Loves [facility] her neck is getting better.  [facility] Gre[facility] [place] [facility] integr[facility] medicine training  November 01, 2023: greater than 50% improvement in Botox in headache frequency and severity however she still has a significant burden of migraines over the last 3 months 8 migraine days a month and greater than 15 total headache days a month will prescribe Emgality.  Terrilee Files for neck pain  Muscle relaxers neck pain  [email or web address] for needling and massage  Prescribe emgality No orders of the defined types were placed in this encounter.  No orders of the defined types were placed in this encounter.    08/02/2023: stable, doing extremely well if not better than before.  G6/05/2023: doing great, stable  01/16/2023: doing well. >> 50% improvement on botox in headache freq and severity and duraction! Now only 4-5 migraines a month will give nurtc for as needed, Only 4-5 migraine days a month and <6 total headache days a month. Failed imitrex, rizatriptan.  No orders of the defined types were placed in this encounter.   09/09/2022: stable doing amazing >> 50% improvement on botox in headache freq and severity and duraction! Now only 4 migraines a month will give nurtc for as needed, Only 4 migraine days a month and 4 total headache days a month. Failed imitrex, rizatriptan.  06/19/2022: stable doing amazing! Now only 4 migraines a month with give nurtec for as needed  03/27/2022: doing great, 4 migraine days a month and they are mild and easily treatable. The wether made it a bit worse these last few months but >> 90% improvement.  01/02/2022: first botox. Baseline: daily headaches and 15-20 migraine days a  month.   Reviewed orally with patient, additionally signature is on file:  Botulism toxin has been approved by the Federal drug administration for treatment of chronic migraine. Botulism toxin does not cure chronic migraine and it may not be effective in some patients.  The administration of botulism toxin is accomplished by injecting a small amount of toxin into the muscles of the neck and head. Dosage must be titrated for each individual. Any benefits resulting from botulism toxin tend to wear off after 3 months with a repeat injection required if benefit is to be maintained. Injections are usually done every 3-4 months with maximum effect peak achieved by about 2 or 3 weeks. Botulism toxin is expensive and you should be sure of what costs you will incur resulting from the injection.  The side effects of botulism toxin use for chronic migraine may include:   -Transient, and usually mild, facial weakness with facial injections  -Transient, and usually mild, head or neck weakness with head/neck injections  -Reduction or loss of forehead facial animation due to forehead muscle weakness  -Eyelid drooping  -Dry eye  -Pain at the site of injection or bruising at the site of injection  -Double vision  -Potential unknown long term risks  Contraindications: You should not have Botox if you are pregnant, nursing, allergic to albumin, have an infection, skin condition, or muscle weakness at the site of the injection, or have myasthenia gravis, Lambert-Eaton syndrome, or ALS.  It is also possible that as with any injection, there may be an allergic reaction or  no effect from the medication. Reduced effectiveness after repeated injections is sometimes seen and rarely infection at the injection site may occur. All care will be taken to prevent these side effects. If therapy is given over a long time, atrophy and wasting in the muscle injected may occur. Occasionally the patient's become refractory to  treatment because they develop antibodies to the toxin. In this event, therapy needs to be modified.  I have read the above information and consent to the administration of botulism toxin.    BOTOX PROCEDURE NOTE FOR MIGRAINE HEADACHE    Contraindications and precautions discussed with patient(above). Aseptic procedure was observed and patient tolerated procedure. Procedure performed by Dr. Artemio Aly  The condition has existed for more than 6 months, and pt does not have a diagnosis of ALS, Myasthenia Gravis or Lambert-Eaton Syndrome.  Risks and benefits of injections discussed and pt agrees to proceed with the procedure.  Written consent obtained  These injections are medically necessary. Pt  receives good benefits from these injections. These injections do not cause sedations or hallucinations which the oral therapies may cause.  Description of procedure:  The patient was placed in a sitting position. The standard protocol was used for Botox as follows, with 5 units of Botox injected at each site:   -Procerus muscle, midline injection  -Corrugator muscle, bilateral injection  -Frontalis muscle, bilateral injection, with 2 sites each side, medial injection was performed in the upper one third of the frontalis muscle, in the region vertical from the medial inferior edge of the superior orbital rim. The lateral injection was again in the upper one third of the forehead vertically above the lateral limbus of the cornea, 1.5 cm lateral to the medial injection site.  -Temporalis muscle injection, 4 sites, bilaterally. The first injection was 3 cm above the tragus of the ear, second injection site was 1.5 cm to 3 cm up from the first injection site in line with the tragus of the ear. The third injection site was 1.5-3 cm forward between the first 2 injection sites. The fourth injection site was 1.5 cm posterior to the second injection site.   -Occipitalis muscle injection, 3 sites,  bilaterally. The first injection was done one half way between the occipital protuberance and the tip of the mastoid process behind the ear. The second injection site was done lateral and superior to the first, 1 fingerbreadth from the first injection. The third injection site was 1 fingerbreadth superiorly and medially from the first injection site.  -Cervical paraspinal muscle injection, 2 sites, bilateral knee first injection site was 1 cm from the midline of the cervical spine, 3 cm inferior to the lower border of the occipital protuberance. The second injection site was 1.5 cm superiorly and laterally to the first injection site.  -Trapezius muscle injection was performed at 3 sites, bilaterally. The first injection site was in the upper trapezius muscle halfway between the inflection point of the neck, and the acromion. The second injection site was one half way between the acromion and the first injection site. The third injection was done between the first injection site and the inflection point of the neck.   Will return for repeat injection in 3 months.   155 units of Botox was used, 45 Botox not injected was wasted. The patient tolerated the procedure well, there were no complications of the above procedure.

## 2024-01-24 NOTE — Progress Notes (Signed)
 Botox- 200 units x 1 vial Lot: [id number] Expiration: 2027/04 NDC: 0023-3921-02  Bacteriostatic 0.9% Sodium Chloride- 4 mL  Lot: [id number] Expiration: 09/19/24 NDC: 0454098119  Dx: J47.829  S/P  Witnessed by Dione Plover RN

## 2024-01-27 ENCOUNTER — Other Ambulatory Visit (HOSPITAL_BASED_OUTPATIENT_CLINIC_OR_DEPARTMENT_OTHER): Payer: Self-pay

## 2024-02-06 NOTE — Progress Notes (Unsigned)
  Deborah York [address] [place] [address] Phone: [phone] Subjective:   Deborah York, am serving as a scribe for Dr. Antoine Primas.  I'm seeing this patient by the request  of:  Everrett Coombe, DO  CC: Back and neck pain follow-up  [id number]  Deborah York is a 68 y.o. female coming in with complaint of back and neck pain. OMT 12/27/2023. Patient states doing a little better since last appointment. L foot manipulation. Hx Post tendon tear.  Medications patient has been prescribed: None  Taking:         Reviewed prior external information including notes and imaging from previsou exam, outside providers and external EMR if available.   As well as notes that were available from care everywhere and other healthcare systems.  Past medical history, social, surgical and family history all reviewed in electronic medical record.  No pertanent information unless stated regarding to the chief complaint.   Past Medical History:  Diagnosis Date   Complication of anesthesia    Hypertension    Migraines    PONV (postoperative nausea and vomiting)     Allergies  Allergen Reactions   Sulfa Antibiotics Rash     Review of Systems:  No headache, visual changes, nausea, vomiting, diarrhea, constipation, dizziness, abdominal pain, skin rash, fevers, chills, night sweats, weight loss, swollen lymph nodes, body aches, joint swelling, chest pain, shortness of breath, mood changes. POSITIVE muscle aches  Objective  Blood pressure 120/80, pulse 89, height 5\' 7"  (1.702 m), SpO2 98%.   General: No apparent distress alert and oriented x3 mood and affect normal, dressed appropriately.  HEENT: Pupils equal, extraocular movements intact  Respiratory: Patient's speak in full sentences and does not appear short of breath  Cardiovascular: No lower extremity edema, non tender, no erythema  Gait normal MSK:  Back does have some loss lordosis and some  degenerative scoliosis noted.  Some tenderness to palpation noted.  Scapular dyskinesis noted right greater than left. Left scapular dyskinesis noted.   Osteopathic findings C6 flexed rotated and side bent left T3 extended rotated and side bent left inhaled rib T9 extended rotated and side bent left L2 flexed rotated and side bent right L3 flexed rotated and side bent left Sacrum right on right     Assessment and Plan:  Scapular dyskinesis Still will work on different exercises.  Discussed continuing to work with the standing desk that I do think will be the most beneficial.  Discussed with patient about icing regimen and home exercises, increase activity slowly as tolerated.  Follow-up with me again in 6 to 8 weeks otherwise.    Nonallopathic problems  Decision today to treat with OMT was based on Physical Exam  After verbal consent patient was treated with HVLA, ME, FPR techniques in cervical, rib, thoracic, lumbar, and sacral  areas  Patient tolerated the procedure well with improvement in symptoms  Patient given exercises, stretches and lifestyle modifications  See medications in patient instructions if given  Patient will follow up in 4-8 weeks      The above documentation has been reviewed and is accurate and complete Judi Saa, DO        Note: This dictation was prepared with Dragon dictation along with smaller phrase technology. Any transcriptional errors that result from this process are unintentional.

## 2024-02-07 ENCOUNTER — Encounter: Payer: Self-pay | Admitting: Family Medicine

## 2024-02-07 ENCOUNTER — Ambulatory Visit: Payer: Commercial Managed Care - PPO | Admitting: Family Medicine

## 2024-02-07 VITALS — BP 120/80 | HR 89 | Ht 67.0 in

## 2024-02-07 DIAGNOSIS — M9903 Segmental and somatic dysfunction of lumbar region: Secondary | ICD-10-CM | POA: Diagnosis not present

## 2024-02-07 DIAGNOSIS — M9904 Segmental and somatic dysfunction of sacral region: Secondary | ICD-10-CM

## 2024-02-07 DIAGNOSIS — M9901 Segmental and somatic dysfunction of cervical region: Secondary | ICD-10-CM

## 2024-02-07 DIAGNOSIS — G2589 Other specified extrapyramidal and movement disorders: Secondary | ICD-10-CM

## 2024-02-07 DIAGNOSIS — M9902 Segmental and somatic dysfunction of thoracic region: Secondary | ICD-10-CM

## 2024-02-07 DIAGNOSIS — M9908 Segmental and somatic dysfunction of rib cage: Secondary | ICD-10-CM

## 2024-02-07 NOTE — Assessment & Plan Note (Signed)
 Still will work on different exercises.  Discussed continuing to work with the standing desk that I do think will be the most beneficial.  Discussed with patient about icing regimen and home exercises, increase activity slowly as tolerated.  Follow-up with me again in 6 to 8 weeks otherwise.

## 2024-02-07 NOTE — Patient Instructions (Signed)
 Good to see you! [employer] [facility] [employer] in the house See you again in 2 months

## 2024-02-13 ENCOUNTER — Other Ambulatory Visit (HOSPITAL_BASED_OUTPATIENT_CLINIC_OR_DEPARTMENT_OTHER): Payer: Self-pay

## 2024-02-21 ENCOUNTER — Other Ambulatory Visit (HOSPITAL_BASED_OUTPATIENT_CLINIC_OR_DEPARTMENT_OTHER): Payer: Self-pay

## 2024-02-21 ENCOUNTER — Encounter: Payer: Self-pay | Admitting: Family Medicine

## 2024-02-21 ENCOUNTER — Ambulatory Visit: Admitting: Family Medicine

## 2024-02-21 VITALS — BP 108/68 | HR 87 | Ht 67.0 in | Wt 139.0 lb

## 2024-02-21 DIAGNOSIS — N952 Postmenopausal atrophic vaginitis: Secondary | ICD-10-CM

## 2024-02-21 DIAGNOSIS — Z0184 Encounter for antibody response examination: Secondary | ICD-10-CM | POA: Insufficient documentation

## 2024-02-21 DIAGNOSIS — N3946 Mixed incontinence: Secondary | ICD-10-CM | POA: Diagnosis not present

## 2024-02-21 MED ORDER — ESTRADIOL 0.1 MG/GM VA CREA
1.0000 | TOPICAL_CREAM | VAGINAL | 3 refills | Status: AC
  Filled 2024-02-21: qty 42.5, 90d supply, fill #0
  Filled 2024-08-24: qty 42.5, 90d supply, fill #1

## 2024-02-21 NOTE — Progress Notes (Signed)
 Deborah York - 68 y.o. female MRN [id number]  Date of birth: [birth year]  Subjective Chief Complaint  Patient presents with   Medical Management of Chronic Issues    HPI Deborah York is a 68 y.o. female here today for follow up visit.   Overall she is doing well.  History of mixed urinary incontinence.  Seeing urogynecology who recommended pelvic floor PT.  Requesting referral to [facility] location.  She would also like to re-start her estrace cream.  Having some issues with vaginal atrophy and dryness.    Would like to have MMR titers checked due to ongoing outbreaks.   ROS:  A comprehensive ROS was completed and negative except as noted per HPI  Allergies  Allergen Reactions   Sulfa Antibiotics Rash    Past Medical History:  Diagnosis Date   Complication of anesthesia    Hypertension    Migraines    PONV (postoperative nausea and vomiting)     Past Surgical History:  Procedure Laterality Date   IRRIGATION AND DEBRIDEMENT SHOULDER Right 03/23/2021   Procedure: IRRIGATION AND DEBRIDEMENT SHOULDER;  Surgeon: Bjorn Pippin, MD;  Location: [facility];  Service: Orthopedics;  Laterality: Right;   LYSIS OF ADHESION Right 03/23/2021   Procedure: LYSIS OF ADHESION WITH MANIPULATION;  Surgeon: Bjorn Pippin, MD;  Location: [facility];  Service: Orthopedics;  Laterality: Right;   SHOULDER ACROMIOPLASTY Right 03/23/2021   Procedure: SHOULDER ACROMIOPLASTY;  Surgeon: Bjorn Pippin, MD;  Location: [facility];  Service: Orthopedics;  Laterality: Right;   SHOULDER ARTHROSCOPY WITH DISTAL CLAVICLE RESECTION Right 03/23/2021   Procedure: SHOULDER ARTHROSCOPY WITH DISTAL CLAVICLE RESECTION;  Surgeon: Bjorn Pippin, MD;  Location: [facility];  Service: Orthopedics;  Laterality: Right;   TOE SURGERY      Social History   Socioeconomic History   Marital status: Married    Spouse name: Not on file   Number of children: Not on file    Years of education: Not on file   Highest education level: Not on file  Occupational History   Not on file  Tobacco Use   Smoking status: Former   Smokeless tobacco: Former  Advertising account planner   Vaping status: Never Used  Substance and Sexual Activity   Alcohol use: Not Currently   Drug use: Never   Sexual activity: Not on file  Other Topics Concern   Not on file  Social History Narrative   Caffeine- one cup daily, diet coke daily.  Education: Family MD (healthy weight wellness).      Social Drivers of Corporate investment banker Strain: Not on file  Food Insecurity: Not on file  Transportation Needs: Not on file  Physical Activity: Not on file  Stress: Not on file  Social Connections: Unknown (04/01/2022)   [facility]   Social Network    Social Network: Not on file    Family History  Problem Relation Age of Onset   Cancer Mother    Migraines Mother    Hypertension Mother    Heart failure Father    Migraines Maternal Grandmother    Migraines Other     Health Maintenance  Topic Date Due   Hepatitis C Screening  Never done   COVID-19 Vaccine (7 - Mixed Product risk 2024-25 season) 01/27/2024   INFLUENZA VACCINE  06/19/2024   MAMMOGRAM  11/14/2024   Colonoscopy  02/18/2031   DTaP/Tdap/Td (3 - Td  or Tdap) 11/30/2032   Pneumonia Vaccine 52+ Years old  Completed   DEXA SCAN  Completed   Zoster Vaccines- Shingrix  Completed   HPV VACCINES  Aged Out     ----------------------------------------------------------------------------------------------------------------------------------------------------------------------------------------------------------------- Physical Exam BP 108/68 (BP Location: Left Arm, Patient Position: Sitting, Cuff Size: Normal)   Pulse 87   Ht 5\' 7"  (1.702 m)   Wt 139 lb (63 kg)   SpO2 100%   BMI 21.77 kg/m   Physical Exam Constitutional:      Appearance: Normal appearance.  Eyes:     General: No scleral  icterus. Neurological:     Mental Status: She is alert.  Psychiatric:        Mood and Affect: Mood normal.        Behavior: Behavior normal.     ------------------------------------------------------------------------------------------------------------------------------------------------------------------------------------------------------------------- Assessment and Plan  Mixed stress and urge urinary incontinence Seeing urogyn who recommend PT for pelvic floor. Referral placed for pelvic floor physical therapy.   Atrophic vaginitis Estrace renewed.   Immunity status testing MMR titers ordered.    Meds ordered this encounter  Medications   estradiol (ESTRACE) 0.1 MG/GM vaginal cream    Sig: Place 1 Applicatorful vaginally 3 (three) times a week.    Dispense:  42.5 g    Refill:  3    No follow-ups on file.

## 2024-02-21 NOTE — Assessment & Plan Note (Signed)
 MMR titers ordered

## 2024-02-21 NOTE — Assessment & Plan Note (Signed)
Estrace renewed

## 2024-02-21 NOTE — Assessment & Plan Note (Signed)
 Seeing urogyn who recommend PT for pelvic floor. Referral placed for pelvic floor physical therapy.

## 2024-02-22 LAB — MEASLES/MUMPS/RUBELLA IMMUNITY
MUMPS ABS, IGG: 250 [AU]/ml (ref >10.9)
RUBEOLA AB, IGG: >300 [AU]/ml (ref >16.4)
Rubella Antibodies, IGG: 31.9 {index} (ref >0.99)

## 2024-02-24 ENCOUNTER — Telehealth: Payer: Self-pay

## 2024-02-24 ENCOUNTER — Other Ambulatory Visit (HOSPITAL_BASED_OUTPATIENT_CLINIC_OR_DEPARTMENT_OTHER): Payer: Self-pay

## 2024-02-24 ENCOUNTER — Other Ambulatory Visit (HOSPITAL_COMMUNITY): Payer: Self-pay

## 2024-02-24 ENCOUNTER — Encounter: Payer: Self-pay | Admitting: Family Medicine

## 2024-02-24 NOTE — Telephone Encounter (Signed)
 Pharmacy Patient Advocate Encounter   Received notification from CoverMyMeds that prior authorization for Emgality 120MG /ML auto-injectors (migraine) is required/requested.   Insurance verification completed.   The patient is insured through [facility] .   Per test claim: Refill too soon. PA is not needed at this time. Medication was filled 02/24/2024. Next eligible fill date is 03/19/2024.    PA good thru 07/08/2024 for the 1ml maintenance dose.

## 2024-02-28 DIAGNOSIS — Z6821 Body mass index (BMI) 21.0-21.9, adult: Secondary | ICD-10-CM | POA: Diagnosis not present

## 2024-02-28 DIAGNOSIS — Z1151 Encounter for screening for human papillomavirus (HPV): Secondary | ICD-10-CM | POA: Diagnosis not present

## 2024-02-28 DIAGNOSIS — Z01419 Encounter for gynecological examination (general) (routine) without abnormal findings: Secondary | ICD-10-CM | POA: Diagnosis not present

## 2024-02-28 DIAGNOSIS — Z124 Encounter for screening for malignant neoplasm of cervix: Secondary | ICD-10-CM | POA: Diagnosis not present

## 2024-02-28 DIAGNOSIS — Z803 Family history of malignant neoplasm of breast: Secondary | ICD-10-CM | POA: Diagnosis not present

## 2024-02-28 DIAGNOSIS — N393 Stress incontinence (female) (male): Secondary | ICD-10-CM | POA: Diagnosis not present

## 2024-03-23 ENCOUNTER — Other Ambulatory Visit (HOSPITAL_BASED_OUTPATIENT_CLINIC_OR_DEPARTMENT_OTHER): Payer: Self-pay

## 2024-03-23 ENCOUNTER — Other Ambulatory Visit: Payer: Self-pay | Admitting: Family Medicine

## 2024-03-23 ENCOUNTER — Other Ambulatory Visit: Payer: Self-pay

## 2024-03-23 MED ORDER — SPIRONOLACTONE 100 MG PO TABS
100.0000 mg | ORAL_TABLET | Freq: Every day | ORAL | 3 refills | Status: AC
  Filled 2024-03-23: qty 90, 90d supply, fill #0
  Filled 2024-05-31 – 2024-06-03 (×2): qty 90, 90d supply, fill #1
  Filled 2024-09-23: qty 90, 90d supply, fill #2

## 2024-04-01 ENCOUNTER — Other Ambulatory Visit (HOSPITAL_COMMUNITY): Payer: Self-pay

## 2024-04-01 ENCOUNTER — Telehealth: Payer: Self-pay

## 2024-04-01 NOTE — Telephone Encounter (Signed)
 Pharmacy Patient Advocate Encounter  Received notification from [facility] that Prior Authorization for Nurtec 75MG  dispersible tablets has been APPROVED from 04/01/2024 to 04/01/2025. Ran test claim, Copay is $0. This test claim was processed through [facility]- copay amounts may vary at other pharmacies due to pharmacy/plan contracts, or as the patient moves through the different stages of their insurance plan.   PA #/Case ID/Reference #: PA Case ID #: [id number]

## 2024-04-01 NOTE — Telephone Encounter (Signed)
 Pharmacy Patient Advocate Encounter   Received notification from CoverMyMeds that prior authorization for Nurtec 75MG  dispersible tablets is required/requested.   Insurance verification completed.   The patient is insured through [facility] .   Per test claim: PA required; PA submitted to above mentioned insurance via CoverMyMeds Key/confirmation #/EOC [id number] Status is pending

## 2024-04-02 ENCOUNTER — Other Ambulatory Visit: Payer: Self-pay

## 2024-04-03 ENCOUNTER — Other Ambulatory Visit: Payer: Self-pay

## 2024-04-03 ENCOUNTER — Other Ambulatory Visit: Payer: Self-pay | Admitting: Pharmacy Technician

## 2024-04-03 NOTE — Progress Notes (Signed)
 Specialty Pharmacy Refill Coordination Note  Deborah York is a 68 y.o. female contacted today regarding refills of specialty medication(s) OnabotulinumtoxinA  (Botox )   Patient requested Courier to Provider Office   Delivery date: 04/28/24   Verified address: [facility] [address], [place], [place] [address]   Medication will be filled on 04/27/24.

## 2024-04-10 ENCOUNTER — Other Ambulatory Visit: Payer: Self-pay

## 2024-04-10 ENCOUNTER — Other Ambulatory Visit: Payer: Self-pay | Admitting: Family Medicine

## 2024-04-10 ENCOUNTER — Other Ambulatory Visit (HOSPITAL_BASED_OUTPATIENT_CLINIC_OR_DEPARTMENT_OTHER): Payer: Self-pay

## 2024-04-10 DIAGNOSIS — F900 Attention-deficit hyperactivity disorder, predominantly inattentive type: Secondary | ICD-10-CM

## 2024-04-10 MED ORDER — NORETHINDRONE-ETH ESTRADIOL 1-5 MG-MCG PO TABS
1.0000 | ORAL_TABLET | Freq: Every day | ORAL | 1 refills | Status: DC
  Filled 2024-04-10: qty 84, 84d supply, fill #0
  Filled 2024-07-30: qty 84, 84d supply, fill #1

## 2024-04-10 MED ORDER — MIRABEGRON ER 25 MG PO TB24
25.0000 mg | ORAL_TABLET | Freq: Every day | ORAL | 1 refills | Status: DC
  Filled 2024-04-10: qty 90, 90d supply, fill #0
  Filled 2024-09-23: qty 90, 90d supply, fill #1

## 2024-04-10 MED ORDER — CLINDAMYCIN PHOSPHATE 1 % EX SOLN
Freq: Two times a day (BID) | CUTANEOUS | 6 refills | Status: AC
  Filled 2024-04-10: qty 60, 30d supply, fill #0
  Filled 2024-08-24: qty 60, 30d supply, fill #1

## 2024-04-10 MED ORDER — METHYLPHENIDATE HCL 10 MG PO TABS
10.0000 mg | ORAL_TABLET | Freq: Three times a day (TID) | ORAL | 0 refills | Status: DC
  Filled 2024-04-10 – 2024-05-01 (×2): qty 270, 90d supply, fill #0

## 2024-04-17 ENCOUNTER — Ambulatory Visit: Admitting: Family Medicine

## 2024-04-24 ENCOUNTER — Ambulatory Visit: Admitting: Physical Therapy

## 2024-04-27 ENCOUNTER — Other Ambulatory Visit: Payer: Self-pay

## 2024-04-30 NOTE — Progress Notes (Signed)
  [facility] [address] [place] [address] Phone: [phone] Subjective:   Deborah York, am serving as a scribe for Dr. Ronnell Coins.  I'm seeing this patient by the request  of:  Adela Holter, DO  CC: neck pain follow up   OZD:[id number]  Deborah York is a 68 y.o. female coming in with complaint of back and neck pain. OMT 02/07/2024. Patient states that manipulation is helpful but she continues to have chronic pain.   Medications patient has been prescribed: None  Taking:         Reviewed prior external information including notes and imaging from previsou exam, outside providers and external EMR if available.   As well as notes that were available from care everywhere and other healthcare systems.  Past medical history, social, surgical and family history all reviewed in electronic medical record.  No pertanent information unless stated regarding to the chief complaint.   Past Medical History:  Diagnosis Date   Complication of anesthesia    Hypertension    Migraines    PONV (postoperative nausea and vomiting)     Allergies  Allergen Reactions   Sulfa Antibiotics Rash     Review of Systems:  No  visual changes, nausea, vomiting, diarrhea, constipation, dizziness, abdominal pain, skin rash, fevers, chills, night sweats, weight loss, swollen lymph nodes, body aches, joint swelling, chest pain, shortness of breath, mood changes. POSITIVE muscle aches, headache  Objective  Blood pressure 118/74, height 5' 7 (1.702 m), weight 141 lb (64 kg).   General: No apparent distress alert and oriented x3 mood and affect normal, dressed appropriately.  HEENT: Pupils equal, extraocular movements intact  Respiratory: Patient's speak in full sentences and does not appear short of breath  Cardiovascular: No lower extremity edema, non tender, no erythema  Neck exam does have some loss lordosis.  Some limited sidebending bilaterally.   Mild crepitus.  Negative Spurling's.  Still has some weakness noted in the parascapular area.  Osteopathic findings  C2 flexed rotated and side bent right C6 flexed rotated and side bent left C7 flexed rotated and side bent right T3 extended rotated and side bent right inhaled rib T9 extended rotated and side bent left L2 flexed rotated and side bent right Sacrum right on right       Assessment and Plan:  Scapular dyskinesis Continue to work on strengthening and muscle imbalances.  We did get x-rays today to further evaluate.  Discussed icing regimen and home exercises.  Increase activity slowly.  Follow-up again in 6 to 8 weeks otherwise.    Nonallopathic problems  Decision today to treat with OMT was based on Physical Exam  After verbal consent patient was treated with HVLA, ME, FPR techniques in cervical, rib, thoracic, lumbar, and sacral  areas avoided HVLA on the cervical region  Patient tolerated the procedure well with improvement in symptoms  Patient given exercises, stretches and lifestyle modifications  See medications in patient instructions if given  Patient will follow up in 4-8 weeks     The above documentation has been reviewed and is accurate and complete Verdis Bassette M Shamara Soza, DO         Note: This dictation was prepared with Dragon dictation along with smaller phrase technology. Any transcriptional errors that result from this process are unintentional.

## 2024-04-30 NOTE — Therapy (Signed)
 OUTPATIENT PHYSICAL THERAPY FEMALE PELVIC EVALUATION   Patient Name: Deborah York MRN: [id number] DOB:[birth year], 68 y.o., female Today's Date: 05/01/2024  END OF SESSION:  PT End of Session - 05/01/24 1101     Visit Number 1    Date for PT Re-Evaluation 10/31/24    Authorization Type [employer]    PT Start Time 1100    PT Stop Time 1140    PT Time Calculation (min) 40 min    Activity Tolerance Patient tolerated treatment well    Behavior During Therapy WFL for tasks assessed/performed          Past Medical History:  Diagnosis Date   Complication of anesthesia    Hypertension    Migraines    PONV (postoperative nausea and vomiting)    Past Surgical History:  Procedure Laterality Date   IRRIGATION AND DEBRIDEMENT SHOULDER Right 03/23/2021   Procedure: IRRIGATION AND DEBRIDEMENT SHOULDER;  Surgeon: Micheline Ahr, MD;  Location: [facility];  Service: Orthopedics;  Laterality: Right;   LYSIS OF ADHESION Right 03/23/2021   Procedure: LYSIS OF ADHESION WITH MANIPULATION;  Surgeon: Micheline Ahr, MD;  Location: [facility];  Service: Orthopedics;  Laterality: Right;   SHOULDER ACROMIOPLASTY Right 03/23/2021   Procedure: SHOULDER ACROMIOPLASTY;  Surgeon: Micheline Ahr, MD;  Location: [facility];  Service: Orthopedics;  Laterality: Right;   SHOULDER ARTHROSCOPY WITH DISTAL CLAVICLE RESECTION Right 03/23/2021   Procedure: SHOULDER ARTHROSCOPY WITH DISTAL CLAVICLE RESECTION;  Surgeon: Micheline Ahr, MD;  Location: [facility];  Service: Orthopedics;  Laterality: Right;   TOE SURGERY     Patient Active Problem List   Diagnosis Date Noted   Immunity status testing 02/21/2024   Scapular dyskinesis 12/27/2023   Mixed stress and urge urinary incontinence 09/20/2023   Acquired supination of foot 08/02/2023   Stress fracture of navicular bone of left foot 07/26/2023   Iron deficiency 12/22/2021   Reactive thrombocytosis 12/22/2021   Well  adult exam 11/26/2021   Chronic constipation 05/21/2021   Dysthymic disorder 05/21/2021   Aortic atherosclerosis (HCC) 03/10/2021   Right hand pain 03/25/2020   Fracture of humeral head, right, closed 03/04/2020   ADHD (attention deficit hyperactivity disorder) 01/29/2020   Hypertension 01/29/2020   Atrophic vaginitis 06/21/2017   Migraine headache 06/13/2016   Age-related facial wrinkles 12/04/2012   Other specified hypertrophic and atrophic condition of skin 12/04/2012   Ptosis, 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   Rosacea 12/04/2012   Visual field defect 12/04/2012    PCP: Adela Holter, DO   REFERRING PROVIDER: Adela Holter, DO   REFERRING DIAG:  N95.2 (ICD-10-CM) - Atrophic vaginitis  N39.46 (ICD-10-CM) - Mixed stress and urge urinary incontinence    THERAPY DIAG:  Muscle weakness (generalized)  Other lack of coordination  Rationale for Evaluation and Treatment: Rehabilitation  ONSET DATE: 2024  SUBJECTIVE:  SUBJECTIVE STATEMENT: Patient is to re-start her estrace  cream. Patient reports the last 18 months noticed was not able to hike as long before having to go to the bathroom and dribbled urine.  Fluid intake: water  PAIN:  Are you having pain? No  PRECAUTIONS: None  RED FLAGS: None   WEIGHT BEARING RESTRICTIONS: No  FALLS:  Has patient fallen in last 6 months? 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    ACTIVITY LEVEL : walk, hike, arm exercises  PLOF: Independent  PATIENT GOALS: Make sure I can do everything I can do before a bladder sling  PERTINENT HISTORY:  See above  BOWEL MOVEMENT: IBS Pain with bowel movement: No Type of bowel movement:Type (Bristol Stool Scale) Type 1, Frequency 1-2 days, and Strain often Fully empty rectum: No Leakage: No Fiber  supplement/laxative fleet suppository  URINATION: Pain with urination: No Fully empty bladder: Yes:   Stream: Strong Urgency: Yes  Frequency: every 2 hours Leakage: Walking to the bathroom, Coughing, Sneezing, Laughing, Exercise, and hiking Pads: No  INTERCOURSE:not active   PREGNANCY: Vaginal deliveries 1 Episiotomy Yes    PROLAPSE: None   OBJECTIVE:  Note: Objective measures were completed at Evaluation unless otherwise noted.  DIAGNOSTIC FINDINGS:  none  PATIENT SURVEYS:  PFIQ-7: 70 UIQ-7: 43  COGNITION: Overall cognitive status: Within functional limits for tasks assessed     SENSATION: Light touch: Appears intact   POSTURE: No Significant postural limitations   LUMBARAROM/PROM: lumbar ROM decreased by 25%   LOWER EXTREMITY ROM: bilateral hip ROM is full   LOWER EXTREMITY [id number] hip strength is 5/5  PALPATION: Abdominal: Patient initially would contract the upper abdominals. She was able to contract the abdominals equally after tactile cues to show her how to contract the lower abdominals more.                 External Perineal Exam: good coloring of the vulva area, restrictions of the perineal body                             Internal Pelvic Floor: tightness along the sides of the introitus and puborectalis  Patient confirms identification and approves PT to assess internal pelvic floor and treatment Yes No emotional/communication barriers or cognitive limitation. Patient is motivated to learn. Patient understands and agrees with treatment goals and plan. PT explains patient will be examined in standing, sitting, and lying down to see how their muscles and joints work. When they are ready, they will be asked to remove their underwear so PT can examine their perineum. The patient is also given the option of providing their own chaperone as one is not provided in our facility. The patient also has the right and is explained the right to defer or  refuse any part of the evaluation or treatment including the internal exam. With the patient's consent, PT will use one gloved finger to gently assess the muscles of the pelvic floor, seeing how well it contracts and relaxes and if there is muscle symmetry. After, the patient will get dressed and PT and patient will discuss exam findings and plan of care. PT and patient discuss plan of care, schedule, attendance policy and HEP activities.   PELVIC MMT:   MMT eval  Vaginal 2/5 and after manual work increased to 3/5  (Blank rows = not tested)        TONE: Good tone in the pelvic floor muscles  PROLAPSE: none  TODAY'S TREATMENT:                                                                                                                              DATE: 05/01/24  EVAL Examination completed, findings reviewed, pt educated on POC, HEP, and female pelvic floor anatomy, reasoning with pelvic floor assessment internally with pt consent, and abdominal massage. Pt motivated to participate in PT and agreeable to attempt recommendations.     PATIENT EDUCATION:  05/01/2024 Education details: Access Code: [email or web address] Person educated: Patient Education method: Explanation, Demonstration, Tactile cues, Verbal cues, and Handouts Education comprehension: verbalized understanding, returned demonstration, verbal cues required, tactile cues required, and needs further education  HOME EXERCISE PROGRAM: 05/01/24 Access Code: [email or web address] URL: [email or web address] Date: 05/01/2024 Prepared by: Marsha Skeen  Exercises - Seated Pelvic Floor Contraction  - 3 x daily - 7 x weekly - 1 sets - 10 reps - 10 sec hold - Seated Quick Flick Pelvic Floor Contractions  - 3 x daily - 7 x weekly - 1 sets - 10 reps  ASSESSMENT:  CLINICAL IMPRESSION: Patient is a 68 y.o. female who was seen today for physical therapy evaluation and treatment for mixed incontinence. Patient noticed 18 months ago she would  leak urine and have some urgency. Patient will leak urine with walking to the bathroom, coughing, sneezing, laughing, exercise, and hiking. Patient reports she strains often to have a bowel movement due to typical movements are Type 1. Pelvic floor strength is 2/5 and after manual work increased to 3/5. She has tightness along the sides of the introitus and puborectalis. She will contract the upper abdominals more than the lower and was able to contract equally after tactile cues. Patient will benefit from skilled therapy to improve pelvic floor coordination and strength to reduce leakage.   OBJECTIVE IMPAIRMENTS: decreased coordination, decreased strength, and increased fascial restrictions.   ACTIVITY LIMITATIONS: continence and toileting  PARTICIPATION LIMITATIONS: community activity  PERSONAL FACTORS: Time since onset of injury/illness/exacerbation are also affecting patient's functional outcome.   REHAB POTENTIAL: Excellent  CLINICAL DECISION MAKING: Stable/uncomplicated  EVALUATION COMPLEXITY: Low   GOALS: Goals reviewed with patient? Yes  SHORT TERM GOALS: Target date: 05/29/24  Pt to be I with HEP for carry over and continuing recommendations for improved outcomes.  Baseline: Goal status: INITIAL  2.  Pt to to be I with voiding mechanics to decreased need of straining for bladder or bowel habits to decreased strain at pelvic floor  Baseline:  Goal status: INITIAL   LONG TERM GOALS: Target date: 10/31/24  Pt to be I with advanced HEP for carry over and continuing recommendations for improved outcomes.  Baseline:  Goal status: INITIAL  2.  Pt to report minimal straining with voiding to decrease risk prolapse.  Baseline:  Goal status: INITIAL  3.  Patient pelvic floor strength >/= 4/5 to reduce urinary leakage with increased pressure on the pelvic floor.  Baseline:  Goal status: INITIAL  4.  Patient able to hike without urinary leakage due to the ability to quickly  contract the pelvic floor as she steps up and walks down and incline.  Baseline:  Goal status: INITIAL   PLAN:  PT FREQUENCY: 1x/week  PT DURATION: 6 months  PLANNED INTERVENTIONS: 97110-Therapeutic exercises, 97530- Therapeutic activity, 97112- Neuromuscular re-education, 97535- Self Care, [address]- Manual therapy, Patient/Family education, Joint mobilization, Spinal mobilization, and Biofeedback  PLAN FOR NEXT SESSION: diaphragmatic breathing, manual work to abdomen for constipation, abdominal contraction, how to have a bowel movement.    Marsha Skeen, PT 05/01/24 12:04 PM

## 2024-05-01 ENCOUNTER — Encounter: Payer: Self-pay | Admitting: Physical Therapy

## 2024-05-01 ENCOUNTER — Encounter: Payer: Self-pay | Admitting: Family Medicine

## 2024-05-01 ENCOUNTER — Other Ambulatory Visit: Payer: Self-pay

## 2024-05-01 ENCOUNTER — Ambulatory Visit: Attending: Family Medicine | Admitting: Physical Therapy

## 2024-05-01 ENCOUNTER — Ambulatory Visit: Admitting: Neurology

## 2024-05-01 ENCOUNTER — Other Ambulatory Visit (HOSPITAL_BASED_OUTPATIENT_CLINIC_OR_DEPARTMENT_OTHER): Payer: Self-pay

## 2024-05-01 ENCOUNTER — Ambulatory Visit (INDEPENDENT_AMBULATORY_CARE_PROVIDER_SITE_OTHER)

## 2024-05-01 ENCOUNTER — Ambulatory Visit: Admitting: Family Medicine

## 2024-05-01 VITALS — BP 114/70

## 2024-05-01 VITALS — BP 118/74 | Ht 67.0 in | Wt 141.0 lb

## 2024-05-01 DIAGNOSIS — M50222 Other cervical disc displacement at C5-C6 level: Secondary | ICD-10-CM | POA: Diagnosis not present

## 2024-05-01 DIAGNOSIS — M4802 Spinal stenosis, cervical region: Secondary | ICD-10-CM | POA: Diagnosis not present

## 2024-05-01 DIAGNOSIS — N3946 Mixed incontinence: Secondary | ICD-10-CM | POA: Diagnosis not present

## 2024-05-01 DIAGNOSIS — G2589 Other specified extrapyramidal and movement disorders: Secondary | ICD-10-CM | POA: Diagnosis not present

## 2024-05-01 DIAGNOSIS — M9901 Segmental and somatic dysfunction of cervical region: Secondary | ICD-10-CM

## 2024-05-01 DIAGNOSIS — M9904 Segmental and somatic dysfunction of sacral region: Secondary | ICD-10-CM

## 2024-05-01 DIAGNOSIS — G43709 Chronic migraine without aura, not intractable, without status migrainosus: Secondary | ICD-10-CM

## 2024-05-01 DIAGNOSIS — M542 Cervicalgia: Secondary | ICD-10-CM

## 2024-05-01 DIAGNOSIS — M9903 Segmental and somatic dysfunction of lumbar region: Secondary | ICD-10-CM | POA: Diagnosis not present

## 2024-05-01 DIAGNOSIS — M6281 Muscle weakness (generalized): Secondary | ICD-10-CM | POA: Diagnosis not present

## 2024-05-01 DIAGNOSIS — M47812 Spondylosis without myelopathy or radiculopathy, cervical region: Secondary | ICD-10-CM | POA: Diagnosis not present

## 2024-05-01 DIAGNOSIS — M9908 Segmental and somatic dysfunction of rib cage: Secondary | ICD-10-CM | POA: Diagnosis not present

## 2024-05-01 DIAGNOSIS — M9902 Segmental and somatic dysfunction of thoracic region: Secondary | ICD-10-CM

## 2024-05-01 DIAGNOSIS — R278 Other lack of coordination: Secondary | ICD-10-CM | POA: Diagnosis not present

## 2024-05-01 DIAGNOSIS — N952 Postmenopausal atrophic vaginitis: Secondary | ICD-10-CM | POA: Insufficient documentation

## 2024-05-01 MED ORDER — ONABOTULINUMTOXINA 200 UNITS IJ SOLR
155.0000 [IU] | Freq: Once | INTRAMUSCULAR | Status: AC
  Administered 2024-05-01: 155 [IU] via INTRAMUSCULAR

## 2024-05-01 NOTE — Progress Notes (Signed)
 Consent Form Botulism Toxin Injection For Chronic Migraine  05/01/2024: Still with good impovement, greater than 50% improvement in Botox  in headache frequency and severity) and remainder on Emgality . Now only 4 mild migraines a month and < 6 total headache days a month. Loves [employer] her neck is getting better.  01/24/2024: Doing amazing on Botox (greater than 50% improvement in Botox  in headache frequency and severity) and remainder on Emgality . Now only 4 mild migraines a month and < 6 total headache days a month. Loves [employer] her neck is getting better.  Briana Campbell [place]  [facility] training  November 01, 2023: greater than 50% improvement in Botox  in headache frequency and severity however she still has a significant burden of migraines over the last 3 months 8 migraine days a month and greater than 15 total headache days a month will prescribe Emgality .  Zach Smith for neck pain  Muscle relaxers neck pain  [email or web address] for needling and massage  Prescribe emgality  No orders of the defined types were placed in this encounter.  No orders of the defined types were placed in this encounter.    08/02/2023: stable, doing extremely well if not better than before.  G6/05/2023: doing great, stable  01/16/2023: doing well. >> 50% improvement on botox  in headache freq and severity and duraction! Now only 4-5 migraines a month will give nurtc for as needed, Only 4-5 migraine days a month and <6 total headache days a month. Failed imitrex , rizatriptan.  No orders of the defined types were placed in this encounter.   09/09/2022: stable doing amazing >> 50% improvement on botox  in headache freq and severity and duraction! Now only 4 migraines a month will give nurtc for as needed, Only 4 migraine days a month and 4 total headache days a month. Failed imitrex , rizatriptan.  06/19/2022: stable doing amazing! Now only 4 migraines a month with give  nurtec for as needed  03/27/2022: doing great, 4 migraine days a month and they are mild and easily treatable. The wether made it a bit worse these last few months but >> 90% improvement.  01/02/2022: first botox . Baseline: daily headaches and 15-20 migraine days a month.   Reviewed orally with patient, additionally signature is on file:  Botulism toxin has been approved by the Federal drug administration for treatment of chronic migraine. Botulism toxin does not cure chronic migraine and it may not be effective in some patients.  The administration of botulism toxin is accomplished by injecting a small amount of toxin into the muscles of the neck and head. Dosage must be titrated for each individual. Any benefits resulting from botulism toxin tend to wear off after 3 months with a repeat injection required if benefit is to be maintained. Injections are usually done every 3-4 months with maximum effect peak achieved by about 2 or 3 weeks. Botulism toxin is expensive and you should be sure of what costs you will incur resulting from the injection.  The side effects of botulism toxin use for chronic migraine may include:   -Transient, and usually mild, facial weakness with facial injections  -Transient, and usually mild, head or neck weakness with head/neck injections  -Reduction or loss of forehead facial animation due to forehead muscle weakness  -Eyelid drooping  -Dry eye  -Pain at the site of injection or bruising at the site of injection  -Double vision  -Potential unknown long term risks  Contraindications: You should not have Botox  if you  are pregnant, nursing, allergic to albumin, have an infection, skin condition, or muscle weakness at the site of the injection, or have myasthenia gravis, Lambert-Eaton syndrome, or ALS.  It is also possible that as with any injection, there may be an allergic reaction or no effect from the medication. Reduced effectiveness after repeated injections is  sometimes seen and rarely infection at the injection site may occur. All care will be taken to prevent these side effects. If therapy is given over a long time, atrophy and wasting in the muscle injected may occur. Occasionally the patient's become refractory to treatment because they develop antibodies to the toxin. In this event, therapy needs to be modified.  I have read the above information and consent to the administration of botulism toxin.    BOTOX  PROCEDURE NOTE FOR MIGRAINE HEADACHE    Contraindications and precautions discussed with patient(above). Aseptic procedure was observed and patient tolerated procedure. Procedure performed by Dr. Criselda Dolly  The condition has existed for more than 6 months, and pt does not have a diagnosis of ALS, Myasthenia Gravis or Lambert-Eaton Syndrome.  Risks and benefits of injections discussed and pt agrees to proceed with the procedure.  Written consent obtained  These injections are medically necessary. Pt  receives good benefits from these injections. These injections do not cause sedations or hallucinations which the oral therapies may cause.  Description of procedure:  The patient was placed in a sitting position. The standard protocol was used for Botox  as follows, with 5 units of Botox  injected at each site:   -Procerus muscle, midline injection  -Corrugator muscle, bilateral injection  -Frontalis muscle, bilateral injection, with 2 sites each side, medial injection was performed in the upper one third of the frontalis muscle, in the region vertical from the medial inferior edge of the superior orbital rim. The lateral injection was again in the upper one third of the forehead vertically above the lateral limbus of the cornea, 1.5 cm lateral to the medial injection site.  -Temporalis muscle injection, 4 sites, bilaterally. The first injection was 3 cm above the tragus of the ear, second injection site was 1.5 cm to 3 cm up from the first  injection site in line with the tragus of the ear. The third injection site was 1.5-3 cm forward between the first 2 injection sites. The fourth injection site was 1.5 cm posterior to the second injection site.   -Occipitalis muscle injection, 3 sites, bilaterally. The first injection was done one half way between the occipital protuberance and the tip of the mastoid process behind the ear. The second injection site was done lateral and superior to the first, 1 fingerbreadth from the first injection. The third injection site was 1 fingerbreadth superiorly and medially from the first injection site.  -Cervical paraspinal muscle injection, 2 sites, bilateral knee first injection site was 1 cm from the midline of the cervical spine, 3 cm inferior to the lower border of the occipital protuberance. The second injection site was 1.5 cm superiorly and laterally to the first injection site.  -Trapezius muscle injection was performed at 3 sites, bilaterally. The first injection site was in the upper trapezius muscle halfway between the inflection point of the neck, and the acromion. The second injection site was one half way between the acromion and the first injection site. The third injection was done between the first injection site and the inflection point of the neck.   Will return for repeat injection in 3 months.  155 units of Botox  was used, 45 Botox  not injected was wasted. The patient tolerated the procedure well, there were no complications of the above procedure.

## 2024-05-01 NOTE — Assessment & Plan Note (Signed)
 Continue to work on strengthening and muscle imbalances.  We did get x-rays today to further evaluate.  Discussed icing regimen and home exercises.  Increase activity slowly.  Follow-up again in 6 to 8 weeks otherwise.

## 2024-05-01 NOTE — Patient Instructions (Addendum)
 Cervical xray today See you again in 2-3 months 2 tennis balls and a tube sock

## 2024-05-01 NOTE — Progress Notes (Signed)
 Botox - 200 units x 1 vial Lot: [id number] Expiration: 2027/10 NDC: 0023-3921-02  Bacteriostatic 0.9% Sodium Chloride - 4 mL  Lot: [id number] Expiration: 09/19/24 NDC: 0865784696  Dx: E95.284  S/P  Witnessed by Deidre Fat

## 2024-05-13 ENCOUNTER — Ambulatory Visit: Payer: Self-pay | Admitting: Family Medicine

## 2024-05-19 DIAGNOSIS — L570 Actinic keratosis: Secondary | ICD-10-CM | POA: Diagnosis not present

## 2024-05-19 DIAGNOSIS — I788 Other diseases of capillaries: Secondary | ICD-10-CM | POA: Diagnosis not present

## 2024-05-19 DIAGNOSIS — L821 Other seborrheic keratosis: Secondary | ICD-10-CM | POA: Diagnosis not present

## 2024-05-20 ENCOUNTER — Telehealth: Payer: Self-pay | Admitting: Neurology

## 2024-05-20 NOTE — Telephone Encounter (Signed)
 FYI- I called pt and she said she had some stuff going on the first few weeks in September. She's scheduled for 9/26 @ 7:45 am per her request.

## 2024-05-20 NOTE — Telephone Encounter (Signed)
 I did no thave my Fridays open when patient left from last botox  and she likes to come in at 745 on a frida morning. Can you call and schedule her for follo wup botox  please? I will be opening more Fridays in September so you can add her any Friday I think she will be due September 5th and that is a fridy at 745 call and verify with hr thanks thanks

## 2024-06-01 ENCOUNTER — Other Ambulatory Visit (HOSPITAL_BASED_OUTPATIENT_CLINIC_OR_DEPARTMENT_OTHER): Payer: Self-pay

## 2024-06-05 DIAGNOSIS — H5203 Hypermetropia, bilateral: Secondary | ICD-10-CM | POA: Diagnosis not present

## 2024-06-05 DIAGNOSIS — H52221 Regular astigmatism, right eye: Secondary | ICD-10-CM | POA: Diagnosis not present

## 2024-06-08 ENCOUNTER — Telehealth: Payer: Self-pay | Admitting: Plastic Surgery

## 2024-06-08 NOTE — Telephone Encounter (Signed)
 Called [employer] to try and move sooner with PA for the consult

## 2024-06-25 ENCOUNTER — Other Ambulatory Visit (HOSPITAL_BASED_OUTPATIENT_CLINIC_OR_DEPARTMENT_OTHER): Payer: Self-pay

## 2024-06-25 ENCOUNTER — Other Ambulatory Visit: Payer: Self-pay | Admitting: Neurology

## 2024-06-25 ENCOUNTER — Other Ambulatory Visit: Payer: Self-pay | Admitting: Medical-Surgical

## 2024-06-25 DIAGNOSIS — G43009 Migraine without aura, not intractable, without status migrainosus: Secondary | ICD-10-CM

## 2024-06-26 ENCOUNTER — Other Ambulatory Visit (HOSPITAL_BASED_OUTPATIENT_CLINIC_OR_DEPARTMENT_OTHER): Payer: Self-pay

## 2024-07-01 ENCOUNTER — Other Ambulatory Visit (HOSPITAL_BASED_OUTPATIENT_CLINIC_OR_DEPARTMENT_OTHER): Payer: Self-pay

## 2024-07-01 MED ORDER — NURTEC 75 MG PO TBDP
75.0000 mg | ORAL_TABLET | Freq: Every day | ORAL | 5 refills | Status: DC | PRN
  Filled 2024-07-01: qty 16, 30d supply, fill #0
  Filled 2024-07-01: qty 16, 16d supply, fill #0

## 2024-07-02 ENCOUNTER — Other Ambulatory Visit: Payer: Self-pay

## 2024-07-02 ENCOUNTER — Other Ambulatory Visit (HOSPITAL_BASED_OUTPATIENT_CLINIC_OR_DEPARTMENT_OTHER): Payer: Self-pay

## 2024-07-03 ENCOUNTER — Encounter: Payer: Self-pay | Admitting: Physical Therapy

## 2024-07-03 ENCOUNTER — Other Ambulatory Visit (HOSPITAL_COMMUNITY): Payer: Self-pay

## 2024-07-03 ENCOUNTER — Ambulatory Visit: Attending: Family Medicine | Admitting: Physical Therapy

## 2024-07-03 DIAGNOSIS — R278 Other lack of coordination: Secondary | ICD-10-CM | POA: Diagnosis not present

## 2024-07-03 DIAGNOSIS — M6281 Muscle weakness (generalized): Secondary | ICD-10-CM | POA: Diagnosis not present

## 2024-07-03 NOTE — Therapy (Signed)
 OUTPATIENT PHYSICAL THERAPY FEMALE PELVIC TREATMENT   Patient Name: Deborah York MRN: [id number] DOB:October 06, 1956, 68 y.o., female Today's Date: 07/03/2024  END OF SESSION:  PT End of Session - 07/03/24 0927     Visit Number 2    Date for PT Re-Evaluation 10/31/24    Authorization Type [employer]    PT Start Time 0930    PT Stop Time 1010    PT Time Calculation (min) 40 min    Activity Tolerance Patient tolerated treatment well    Behavior During Therapy WFL for tasks assessed/performed          Past Medical History:  Diagnosis Date   Complication of anesthesia    Hypertension    Migraines    PONV (postoperative nausea and vomiting)    Past Surgical History:  Procedure Laterality Date   IRRIGATION AND DEBRIDEMENT SHOULDER Right 03/23/2021   Procedure: IRRIGATION AND DEBRIDEMENT SHOULDER;  Surgeon: Cristy Bonner DASEN, MD;  Location: [facility];  Service: Orthopedics;  Laterality: Right;   LYSIS OF ADHESION Right 03/23/2021   Procedure: LYSIS OF ADHESION WITH MANIPULATION;  Surgeon: Cristy Bonner DASEN, MD;  Location: [facility];  Service: Orthopedics;  Laterality: Right;   SHOULDER ACROMIOPLASTY Right 03/23/2021   Procedure: SHOULDER ACROMIOPLASTY;  Surgeon: Cristy Bonner DASEN, MD;  Location: [facility];  Service: Orthopedics;  Laterality: Right;   SHOULDER ARTHROSCOPY WITH DISTAL CLAVICLE RESECTION Right 03/23/2021   Procedure: SHOULDER ARTHROSCOPY WITH DISTAL CLAVICLE RESECTION;  Surgeon: Cristy Bonner DASEN, MD;  Location:  SURGERY CENTER;  Service: Orthopedics;  Laterality: Right;   TOE SURGERY     Patient Active Problem List   Diagnosis Date Noted   Immunity status testing 02/21/2024   Scapular dyskinesis 12/27/2023   Mixed stress and urge urinary incontinence 09/20/2023   Acquired supination of foot 08/02/2023   Stress fracture of navicular bone of left foot 07/26/2023   Iron deficiency 12/22/2021   Reactive thrombocytosis 12/22/2021   Well  adult exam 11/26/2021   Chronic constipation 05/21/2021   Dysthymic disorder 05/21/2021   Aortic atherosclerosis (HCC) 03/10/2021   Right hand pain 03/25/2020   Fracture of humeral head, right, closed 03/04/2020   ADHD (attention deficit hyperactivity disorder) 01/29/2020   Hypertension 01/29/2020   Atrophic vaginitis 06/21/2017   Migraine headache 06/13/2016   Age-related facial wrinkles 12/04/2012   Other specified hypertrophic and atrophic condition of skin 12/04/2012   Ptosis, 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   Rosacea 12/04/2012   Visual field defect 12/04/2012    PCP: Alvia Bring, DO   REFERRING PROVIDER: Alvia Bring, DO   REFERRING DIAG:  N95.2 (ICD-10-CM) - Atrophic vaginitis  N39.46 (ICD-10-CM) - Mixed stress and urge urinary incontinence    THERAPY DIAG:  Muscle weakness (generalized)  Other lack of coordination  Rationale for Evaluation and Treatment: Rehabilitation  ONSET DATE: 2024  SUBJECTIVE:  SUBJECTIVE STATEMENT: I have had a lot going on at this time. The Estrace cream helps the vagina.    PAIN:  Are you having pain? No  PRECAUTIONS: None  RED FLAGS: None   WEIGHT BEARING RESTRICTIONS: No  FALLS:  Has patient fallen in last 6 months? 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    ACTIVITY LEVEL : walk, hike, arm exercises  PLOF: Independent  PATIENT GOALS: Make sure I can do everything I can do before a bladder sling  PERTINENT HISTORY:  See above  BOWEL MOVEMENT: IBS Pain with bowel movement: No Type of bowel movement:Type (Bristol Stool Scale) Type 1, Frequency 1-2 days, and Strain often Fully empty rectum: No Leakage: No Fiber supplement/laxative fleet suppository  URINATION: Pain with urination: No Fully empty bladder: Yes:   Stream:  Strong Urgency: Yes  Frequency: every 2 hours Leakage: Walking to the bathroom, Coughing, Sneezing, Laughing, Exercise, and hiking Pads: No  INTERCOURSE:not active   PREGNANCY: Vaginal deliveries 1 Episiotomy Yes    PROLAPSE: None   OBJECTIVE:  Note: Objective measures were completed at Evaluation unless otherwise noted.  DIAGNOSTIC FINDINGS:  none  PATIENT SURVEYS:  PFIQ-7: 35 UIQ-7: 43  COGNITION: Overall cognitive status: Within functional limits for tasks assessed     SENSATION: Light touch: Appears intact   POSTURE: No Significant postural limitations   LUMBARAROM/PROM: lumbar ROM decreased by 25%   LOWER EXTREMITY ROM: bilateral hip ROM is full   LOWER EXTREMITY FFU:apojuzmjo hip strength is 5/5  PALPATION: Abdominal: Patient initially would contract the upper abdominals. She was able to contract the abdominals equally after tactile cues to show her how to contract the lower abdominals more.                 External Perineal Exam: good coloring of the vulva area, restrictions of the perineal body                             Internal Pelvic Floor: tightness along the sides of the introitus and puborectalis  Patient confirms identification and approves PT to assess internal pelvic floor and treatment Yes No emotional/communication barriers or cognitive limitation. Patient is motivated to learn. Patient understands and agrees with treatment goals and plan. PT explains patient will be examined in standing, sitting, and lying down to see how their muscles and joints work. When they are ready, they will be asked to remove their underwear so PT can examine their perineum. The patient is also given the option of providing their own chaperone as one is not provided in our facility. The patient also has the right and is explained the right to defer or refuse any part of the evaluation or treatment including the internal exam. With the patient's consent, PT will use  one gloved finger to gently assess the muscles of the pelvic floor, seeing how well it contracts and relaxes and if there is muscle symmetry. After, the patient will get dressed and PT and patient will discuss exam findings and plan of care. PT and patient discuss plan of care, schedule, attendance policy and HEP activities.   PELVIC MMT:   MMT eval  Vaginal 2/5 and after manual work increased to 3/5  (Blank rows = not tested)        TONE: Good tone in the pelvic floor muscles  PROLAPSE: none  TODAY'S TREATMENT:     07/03/24 Manual: Soft tissue mobilization: Circular massage to  the abdomen to promote peristalic motion of the intestines then educated patient on abdominal massage Manual work to bilateral diaphragm Myofascial release: Tissue rolling of the lower rib cage and abdomen Neuromuscular re-education: Core retraining: Supine with ball squeeze to engage the lower abdomen  with breath 15 x Supine hip flexion isometric to engage the lower abdomen 20 x  Down training: Diaphragmatic breathing to elongate the pelvic floor Therapeutic activities: Functional strengthening activities: Educated patient on how to sit on the commode with knees above the hips, breathing to relax the pelvic floor then breathing out to push the stool out. She was able to demonstrate the technique correctly.                                                                                                                              DATE: 05/01/24  EVAL Examination completed, findings reviewed, pt educated on POC, HEP, and female pelvic floor anatomy, reasoning with pelvic floor assessment internally with pt consent, and abdominal massage. Pt motivated to participate in PT and agreeable to attempt recommendations.     PATIENT EDUCATION:  07/03/2024 Education details: Access Code: [email or web address], education on how to poop , educated patient on abdominal massage Person educated: Patient Education method:  Explanation, Demonstration, Tactile cues, Verbal cues, and Handouts Education comprehension: verbalized understanding, returned demonstration, verbal cues required, tactile cues required, and needs further education  HOME EXERCISE PROGRAM: 07/03/24 Access Code: [email or web address] URL: [email or web address] Date: 07/03/2024 Prepared by: Channing Pereyra  Exercises - Seated Pelvic Floor Contraction  - 3 x daily - 7 x weekly - 1 sets - 10 reps - 10 sec hold - Seated Quick Flick Pelvic Floor Contractions  - 3 x daily - 7 x weekly - 1 sets - 10 reps - Supine Diaphragmatic Breathing  - 1 x daily - 7 x weekly - 1 sets - 10 reps - Supine Hip Adduction Isometric with Ball  - 1 x daily - 7 x weekly - 1 sets - 10 reps - Hooklying Isometric Hip Flexion  - 1 x daily - 7 x weekly - 1 sets - 10 reps  ASSESSMENT:  CLINICAL IMPRESSION: Patient is a 68 y.o. female who was seen today for physical therapy  treatment for mixed incontinence. Patient was able to feel the pelvic floor relax with diaphragmatic breathing. Patient was able to demonstrate how to have a bowel movement correctly. She is learning how to engage her lower abdomen and feel them contract.  Patient will benefit from skilled therapy to improve pelvic floor coordination and strength to reduce leakage.   OBJECTIVE IMPAIRMENTS: decreased coordination, decreased strength, and increased fascial restrictions.   ACTIVITY LIMITATIONS: continence and toileting  PARTICIPATION LIMITATIONS: community activity  PERSONAL FACTORS: Time since onset of injury/illness/exacerbation are also affecting patient's functional outcome.   REHAB POTENTIAL: Excellent  CLINICAL DECISION MAKING: Stable/uncomplicated  EVALUATION COMPLEXITY: Low   GOALS: Goals reviewed with patient? Yes  SHORT TERM GOALS: Target date: 05/29/24  Pt to be I with HEP for carry over and continuing recommendations for improved outcomes.  Baseline: Goal status: INITIAL  2.  Pt to  to be I with voiding mechanics to decreased need of straining for bladder or bowel habits to decreased strain at pelvic floor  Baseline:  Goal status: Met 07/03/24   LONG TERM GOALS: Target date: 10/31/24  Pt to be I with advanced HEP for carry over and continuing recommendations for improved outcomes.  Baseline:  Goal status: INITIAL  2.  Pt to report minimal straining with voiding to decrease risk prolapse.  Baseline:  Goal status: INITIAL  3.  Patient pelvic floor strength >/= 4/5 to reduce urinary leakage with increased pressure on the pelvic floor.  Baseline:  Goal status: INITIAL  4.  Patient able to hike without urinary leakage due to the ability to quickly contract the pelvic floor as she steps up and walks down and incline.  Baseline:  Goal status: INITIAL   PLAN:  PT FREQUENCY: 1x/week  PT DURATION: 6 months  PLANNED INTERVENTIONS: 97110-Therapeutic exercises, 97530- Therapeutic activity, 97112- Neuromuscular re-education, 97535- Self Care, [id number]- Manual therapy, Patient/Family education, Joint mobilization, Spinal mobilization, and Biofeedback  PLAN FOR NEXT SESSION: abdominal contraction, manual work to the sides of the introitus and urogenital diaphragm    Channing Pereyra, PT 07/03/24 10:15 AM

## 2024-07-03 NOTE — Patient Instructions (Signed)
 How To Poop Better:  What are Good Poops? There is no one exact normal, but they should be REGULAR.  This varies from person to person and ranges from up to 3x/day or as little as 3-4/week.  This should stay consistent for you.  They should be formed and ideally one solid mass that doesn't fall apart or dissolve in the water and is Reitz in color.  Lifestyle Tips:  Fiber: Eat 25-31 grams per day Do not hold it.  If you need to go, GO! Try to go every day around the same time Walk and move more Probiotics for more healthy gut bacteria Water and fluids: half of your healthy body weight in ounces  Toileting Tips:  Posture: knees above hips, back flat, look straight ahead, RELAX Relax all the muscles from your face down to your toes Breathe: slow deep breaths into your belly and pelvic floor is RELAXED Blow: Tighten belly and blow like blowing up a balloon, make "SH" sound, make a vowel sound with a deep voice Do NOT sit more than 10 minutes After you are finished, tighten the muscles to reset pelvic floor back to normal      [facility] [address] [place], [place] [address] Phone # [phone] Fax [phone]

## 2024-07-15 ENCOUNTER — Other Ambulatory Visit (HOSPITAL_COMMUNITY): Payer: Self-pay

## 2024-07-16 ENCOUNTER — Telehealth: Payer: Self-pay | Admitting: Neurology

## 2024-07-16 NOTE — Telephone Encounter (Signed)
 Submitted auth renewal request via CMM, status is pending. Key: [id number]

## 2024-07-17 ENCOUNTER — Institutional Professional Consult (permissible substitution): Admitting: Plastic Surgery

## 2024-07-21 ENCOUNTER — Encounter: Payer: Self-pay | Admitting: Sports Medicine

## 2024-07-21 ENCOUNTER — Other Ambulatory Visit (HOSPITAL_BASED_OUTPATIENT_CLINIC_OR_DEPARTMENT_OTHER): Payer: Self-pay

## 2024-07-22 NOTE — Telephone Encounter (Signed)
 Received approval, she will continue to fill through [facility]. Auth#: [id number] (07/21/24-07/20/25)

## 2024-07-23 ENCOUNTER — Other Ambulatory Visit (HOSPITAL_BASED_OUTPATIENT_CLINIC_OR_DEPARTMENT_OTHER): Payer: Self-pay

## 2024-07-23 MED ORDER — FLUCONAZOLE 100 MG PO TABS
100.0000 mg | ORAL_TABLET | Freq: Every day | ORAL | 0 refills | Status: AC | PRN
  Filled 2024-07-23: qty 5, 5d supply, fill #0

## 2024-07-23 MED ORDER — CELECOXIB 100 MG PO CAPS
100.0000 mg | ORAL_CAPSULE | Freq: Two times a day (BID) | ORAL | 0 refills | Status: DC
  Filled 2024-07-23: qty 30, 15d supply, fill #0

## 2024-07-23 MED ORDER — CEPHALEXIN 500 MG PO CAPS
500.0000 mg | ORAL_CAPSULE | Freq: Four times a day (QID) | ORAL | 0 refills | Status: DC
  Filled 2024-07-23: qty 20, 5d supply, fill #0

## 2024-07-23 MED ORDER — DIAZEPAM 10 MG PO TABS
ORAL_TABLET | ORAL | 0 refills | Status: DC
  Filled 2024-07-23: qty 2, 1d supply, fill #0

## 2024-07-24 ENCOUNTER — Encounter: Payer: Self-pay | Admitting: Plastic Surgery

## 2024-07-24 ENCOUNTER — Encounter: Admitting: Physical Therapy

## 2024-07-24 ENCOUNTER — Ambulatory Visit: Admitting: Plastic Surgery

## 2024-07-24 ENCOUNTER — Other Ambulatory Visit (HOSPITAL_BASED_OUTPATIENT_CLINIC_OR_DEPARTMENT_OTHER): Payer: Self-pay

## 2024-07-24 VITALS — BP 120/68 | HR 84 | Ht 67.0 in | Wt 138.2 lb

## 2024-07-24 DIAGNOSIS — H02822 Cysts of right lower eyelid: Secondary | ICD-10-CM

## 2024-07-24 DIAGNOSIS — H02829 Cysts of unspecified eye, unspecified eyelid: Secondary | ICD-10-CM | POA: Insufficient documentation

## 2024-07-24 MED ORDER — DOXYCYCLINE HYCLATE 100 MG PO TABS
100.0000 mg | ORAL_TABLET | Freq: Two times a day (BID) | ORAL | 0 refills | Status: AC
  Filled 2024-07-24: qty 60, 30d supply, fill #0

## 2024-07-24 NOTE — Progress Notes (Signed)
 Patient ID: Deborah York, female    DOB: May 01, 1956, 68 y.o.   MRN: [id number]   Chief Complaint  Patient presents with   Consult    The patient is a 68 year old female here for evaluation of her right lower lid.  She has been dealing with cyst on the lower lid for the last several months.  It seems to be getting a little bit worse and not improving.  She is interested in getting rid of it and certainly does not want to get any larger.  She has had these multiple times in the past and even as a child.  It is right on the lash line of the lateral portion of the right lower eyelid.    Review of Systems  Constitutional: Negative.   HENT: Negative.    Eyes: Negative.   Respiratory: Negative.      Past Medical History:  Diagnosis Date   Complication of anesthesia    Hypertension    Migraines    PONV (postoperative nausea and vomiting)     Past Surgical History:  Procedure Laterality Date   IRRIGATION AND DEBRIDEMENT SHOULDER Right 03/23/2021   Procedure: IRRIGATION AND DEBRIDEMENT SHOULDER;  Surgeon: Cristy Bonner DASEN, MD;  Location: [facility];  Service: Orthopedics;  Laterality: Right;   LYSIS OF ADHESION Right 03/23/2021   Procedure: LYSIS OF ADHESION WITH MANIPULATION;  Surgeon: Cristy Bonner DASEN, MD;  Location: [facility];  Service: Orthopedics;  Laterality: Right;   SHOULDER ACROMIOPLASTY Right 03/23/2021   Procedure: SHOULDER ACROMIOPLASTY;  Surgeon: Cristy Bonner DASEN, MD;  Location: [facility];  Service: Orthopedics;  Laterality: Right;   SHOULDER ARTHROSCOPY WITH DISTAL CLAVICLE RESECTION Right 03/23/2021   Procedure: SHOULDER ARTHROSCOPY WITH DISTAL CLAVICLE RESECTION;  Surgeon: Cristy Bonner DASEN, MD;  Location: [facility];  Service: Orthopedics;  Laterality: Right;   TOE SURGERY        Current Outpatient Medications:    botulinum toxin Type A  (BOTOX ) 200 units injection, Provider to inject 155 units into the muscles of the head  and neck every 12 weeks. Discard remainder., Disp: 1 each, Rfl: 3   CALCIUM  PO, Take by mouth., Disp: , Rfl:    celecoxib  (CELEBREX ) 100 MG capsule, Take 1 capsule (100 mg total) by mouth 2 (two) times daily after surgery for pain,drink a lot of fluids, Disp: 30 capsule, Rfl: 0   clindamycin  (CLEOCIN  T) 1 % external solution, Apply topically 2 (two) times daily., Disp: 60 mL, Rfl: 6   desvenlafaxine  (PRISTIQ ) 50 MG 24 hr tablet, Take 1 tablet (50 mg total) by mouth daily., Disp: 90 tablet, Rfl: 3   diazepam  (VALIUM ) 10 MG tablet, Take 1 (one) tablet by mouth after speaking with dr jerome on the morning of surgery procedures.  make sure to have a ride., Disp: 2 tablet, Rfl: 0   estradiol  (ESTRACE ) 0.1 MG/GM vaginal cream, Place 1 Applicatorful vaginally 3 (three) times a week., Disp: 42.5 g, Rfl: 3   fluconazole  (DIFLUCAN ) 100 MG tablet, Take 1 tablet (100 mg total) by mouth daily as needed for up to 5 days for thrush after antibiotic, Disp: 5 tablet, Rfl: 0   gabapentin  (NEURONTIN ) 600 MG tablet, Take 1 tablet (600 mg total) by mouth at bedtime as needed., Disp: 90 tablet, Rfl: 1   Galcanezumab -gnlm (EMGALITY ) 120 MG/ML SOAJ, Inject 120 mg into the skin every 30 (thirty) days., Disp: 2 mL, Rfl: 11   methylphenidate  (RITALIN ) 10  MG tablet, Take 1 tablet (10 mg total) by mouth 3 (three) times daily with meals., Disp: 270 tablet, Rfl: 0   mirabegron  ER (MYRBETRIQ ) 25 MG TB24 tablet, Take 1 tablet (25 mg total) by mouth daily., Disp: 90 tablet, Rfl: 1   norethindrone -ethinyl estradiol  (FEMHRT  1/5) 1-5 MG-MCG TABS tablet, Take 1 tablet by mouth daily., Disp: 84 tablet, Rfl: 1   ondansetron  (ZOFRAN -ODT) 4 MG disintegrating tablet, Take 1 tablet (4 mg total) by mouth every 8 (eight) hours as needed for nausea or vomiting., Disp: 20 tablet, Rfl: 6   Prasterone  (INTRAROSA ) 6.5 MG INST, Place 6.5 mg vaginally daily., Disp: 28 each, Rfl: 3   Rimegepant Sulfate  (NURTEC) 75 MG TBDP, Take 1 tablet (75 mg total) by  mouth daily as needed. For migraines. Take as close to onset of migraine as possible. Max of 1 tablet a day., Disp: 16 tablet, Rfl: 5   rosuvastatin  (CRESTOR ) 10 MG tablet, Take on Monday, Wednesday, Friday, Disp: 90 tablet, Rfl: 3   spironolactone  (ALDACTONE ) 100 MG tablet, Take 1 tablet (100 mg total) by mouth daily., Disp: 90 tablet, Rfl: 3   telmisartan  (MICARDIS ) 20 MG tablet, Take 1 tablet by mouth daily, Disp: 90 tablet, Rfl: 3   tretinoin  (RETIN-A ) 0.1 % cream, Apply a pea size amount to face every night at bedtime, Disp: 45 g, Rfl: 3   VITAMIN D  PO, Take by mouth., Disp: , Rfl:    cephALEXin  (KEFLEX ) 500 MG capsule, Take 1 capsule (500 mg total) by mouth 4 (four) times daily. Start after operation. (Patient not taking: Reported on 07/24/2024), Disp: 20 capsule, Rfl: 0   Objective:   Vitals:   07/24/24 1238  BP: 120/68  Pulse: 84  SpO2: 98%    Physical Exam HENT:     Head: Atraumatic.  Eyes:   Skin:    Capillary Refill: Capillary refill takes less than 2 seconds.  Neurological:     Mental Status: She is oriented to person, place, and time.  Psychiatric:        Mood and Affect: Mood normal.        Behavior: Behavior normal.        Thought Content: Thought content normal.        Judgment: Judgment normal.     Assessment & Plan:  Cyst of right lower eyelid  Recommend warm soaks to the eye 1-2 times daily for 10 to 15 minutes.  Johnson's baby shampoo cleanses daily and doxycycline .  I will also refer her to an ophthalmologist for further evaluation.  Pictures were obtained of the patient and placed in the chart with the patient's or guardian's permission.   Deborah RAMAN Kao Berkheimer, DO

## 2024-07-29 NOTE — Telephone Encounter (Signed)
 This patient usually does early mornings with you and is scheduled for Friday 9/26 (you will be out of office). It looks like she always does Fridays, you don't have one available until 10/24. Do you have any ideas where we may be able to put her? Thank you :)

## 2024-07-30 ENCOUNTER — Other Ambulatory Visit (HOSPITAL_BASED_OUTPATIENT_CLINIC_OR_DEPARTMENT_OTHER): Payer: Self-pay

## 2024-07-30 NOTE — Telephone Encounter (Signed)
 I think she would be ok to come in 10/3 at 745, would you call her? Thank you!

## 2024-07-31 ENCOUNTER — Ambulatory Visit: Attending: Family Medicine | Admitting: Physical Therapy

## 2024-07-31 ENCOUNTER — Encounter: Payer: Self-pay | Admitting: Physical Therapy

## 2024-07-31 DIAGNOSIS — M6281 Muscle weakness (generalized): Secondary | ICD-10-CM | POA: Insufficient documentation

## 2024-07-31 DIAGNOSIS — R278 Other lack of coordination: Secondary | ICD-10-CM | POA: Insufficient documentation
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
## 2024-08-03 ENCOUNTER — Other Ambulatory Visit (HOSPITAL_COMMUNITY): Payer: Self-pay

## 2024-08-03 NOTE — Telephone Encounter (Signed)
 Spoke with patient and rescheduled Botox  appointment for 10/3 at 7:45am

## 2024-08-10 ENCOUNTER — Other Ambulatory Visit (HOSPITAL_COMMUNITY): Payer: Self-pay

## 2024-08-12 ENCOUNTER — Other Ambulatory Visit: Payer: Self-pay

## 2024-08-13 ENCOUNTER — Other Ambulatory Visit: Payer: Self-pay | Admitting: Pharmacy Technician

## 2024-08-13 ENCOUNTER — Other Ambulatory Visit: Payer: Self-pay

## 2024-08-13 ENCOUNTER — Other Ambulatory Visit (HOSPITAL_COMMUNITY): Payer: Self-pay

## 2024-08-14 ENCOUNTER — Other Ambulatory Visit: Payer: Self-pay

## 2024-08-14 ENCOUNTER — Ambulatory Visit: Admitting: Physical Therapy

## 2024-08-14 ENCOUNTER — Ambulatory Visit: Admitting: Neurology

## 2024-08-14 ENCOUNTER — Other Ambulatory Visit (HOSPITAL_COMMUNITY): Payer: Self-pay

## 2024-08-14 NOTE — Progress Notes (Signed)
 Specialty Pharmacy Refill Coordination Note  Deborah York is a 68 y.o. female contacted today regarding refills of specialty medication(s) OnabotulinumtoxinA  (Botox )   Patient requested Courier to Provider Office   Delivery date: 08/18/24   Verified address: [facility] [address] [place], [place] [address]   Medication will be filled on 08/17/24. Appointment on 08/21/24. Clean claim $0.00 co-pay

## 2024-08-20 NOTE — Telephone Encounter (Signed)
 Pt called back, informed her that Dr. Ines is no longer @ our office. 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 Informed her that Dr. Onita doesn't currently have any Friday openings for the rest of the year. She is going to see if she can get in with [facility] and will call me back once she decides.

## 2024-08-21 ENCOUNTER — Ambulatory Visit: Admitting: Neurology

## 2024-08-21 ENCOUNTER — Ambulatory Visit: Admitting: Family Medicine

## 2024-08-24 ENCOUNTER — Other Ambulatory Visit (HOSPITAL_BASED_OUTPATIENT_CLINIC_OR_DEPARTMENT_OTHER): Payer: Self-pay

## 2024-08-24 ENCOUNTER — Telehealth: Payer: Self-pay | Admitting: Pharmacist

## 2024-08-24 ENCOUNTER — Other Ambulatory Visit: Payer: Self-pay | Admitting: Neurology

## 2024-08-24 DIAGNOSIS — G43709 Chronic migraine without aura, not intractable, without status migrainosus: Secondary | ICD-10-CM

## 2024-08-24 MED ORDER — EMGALITY 120 MG/ML ~~LOC~~ SOAJ
120.0000 mg | SUBCUTANEOUS | 5 refills | Status: DC
  Filled 2024-08-24: qty 1, 30d supply, fill #0
  Filled 2024-09-27: qty 1, 30d supply, fill #1
  Filled 2024-10-27: qty 1, 30d supply, fill #2

## 2024-08-24 NOTE — Telephone Encounter (Signed)
 Pharmacy Patient Advocate Encounter  Received notification from [facility] that Prior Authorization for EMGALITY  120 MG/ML Simpson SOAJ has been APPROVED from 08/24/2024 to 08/24/2025   PA #/Case ID/Reference #: [id number]

## 2024-08-24 NOTE — Telephone Encounter (Signed)
 Pharmacy Patient Advocate Encounter   Received notification from Patient Pharmacy that prior authorization for Emgality  120MG /ML auto-injectors (migraine) is required/requested.   Insurance verification completed.   The patient is insured through [facility].   Per test claim: PA required; PA submitted to above mentioned insurance via Latent Key/confirmation #/EOC [id number] Status is pending

## 2024-08-24 NOTE — Telephone Encounter (Signed)
 Pt has scheduled a f/u with wait list, pt also asking for a refill on her Galcanezumab -gnlm (EMGALITY ) 120 MG/ML SOAJ to [facility]

## 2024-08-28 ENCOUNTER — Ambulatory Visit: Admitting: Family Medicine

## 2024-08-28 ENCOUNTER — Other Ambulatory Visit (HOSPITAL_BASED_OUTPATIENT_CLINIC_OR_DEPARTMENT_OTHER): Payer: Self-pay

## 2024-08-28 ENCOUNTER — Encounter: Payer: Self-pay | Admitting: Family Medicine

## 2024-08-28 VITALS — BP 119/71 | HR 76 | Ht 67.0 in | Wt 139.9 lb

## 2024-08-28 DIAGNOSIS — G4700 Insomnia, unspecified: Secondary | ICD-10-CM | POA: Diagnosis not present

## 2024-08-28 DIAGNOSIS — G43719 Chronic migraine without aura, intractable, without status migrainosus: Secondary | ICD-10-CM | POA: Diagnosis not present

## 2024-08-28 DIAGNOSIS — H02823 Cysts of right eye, unspecified eyelid: Secondary | ICD-10-CM | POA: Diagnosis not present

## 2024-08-28 MED ORDER — TRAZODONE HCL 50 MG PO TABS
25.0000 mg | ORAL_TABLET | Freq: Every evening | ORAL | 1 refills | Status: AC | PRN
  Filled 2024-08-28: qty 90, 90d supply, fill #0

## 2024-08-28 NOTE — Assessment & Plan Note (Signed)
 Urgent referral placed to establish with new neurologist so that she can continue on botox  injections.  She will continue emgality  with [facility] prn.

## 2024-08-28 NOTE — Progress Notes (Signed)
 Deborah York - 68 y.o. female MRN [id number]  Date of birth: 16-May-1956  Subjective Chief Complaint  Patient presents with   Insomnia    Unable to fall or stay asleep Needs referral to Neurologist for Migraines     HPI Deborah York is a 68 y.o. female here today for follow up visit.   She has history of migraines and has received botox  therapy for these in addition to Emgality  and nurtec.  Unfortunately her neurologist left recently and she is in need of new neurologist.  Her migraines have been stable with current regimen.    She has also had some difficulty with sleep.  Both with sleep initiation and sleep maintenance.  She has had this for quite some time.  Worsened some recently as they are planning on moving and her husband has had some difficulty adjusting to this change.   ROS:  A comprehensive ROS was completed and negative except as noted per HPI   Allergies  Allergen Reactions   Sulfa Antibiotics Rash    Past Medical History:  Diagnosis Date   Complication of anesthesia    Hypertension    Migraines    PONV (postoperative nausea and vomiting)     Past Surgical History:  Procedure Laterality Date   IRRIGATION AND DEBRIDEMENT SHOULDER Right 03/23/2021   Procedure: IRRIGATION AND DEBRIDEMENT SHOULDER;  Surgeon: Cristy Bonner DASEN, MD;  Location: [facility];  Service: Orthopedics;  Laterality: Right;   LYSIS OF ADHESION Right 03/23/2021   Procedure: LYSIS OF ADHESION WITH MANIPULATION;  Surgeon: Cristy Bonner DASEN, MD;  Location: [facility];  Service: Orthopedics;  Laterality: Right;   SHOULDER ACROMIOPLASTY Right 03/23/2021   Procedure: SHOULDER ACROMIOPLASTY;  Surgeon: Cristy Bonner DASEN, MD;  Location: [facility];  Service: Orthopedics;  Laterality: Right;   SHOULDER ARTHROSCOPY WITH DISTAL CLAVICLE RESECTION Right 03/23/2021   Procedure: SHOULDER ARTHROSCOPY WITH DISTAL CLAVICLE RESECTION;  Surgeon: Cristy Bonner DASEN, MD;  Location: [facility];  Service: Orthopedics;  Laterality: Right;   TOE SURGERY      Social History   Socioeconomic History   Marital status: Married    Spouse name: Not on file   Number of children: Not on file   Years of education: Not on file   Highest education level: Not on file  Occupational History   Not on file  Tobacco Use   Smoking status: Former   Smokeless tobacco: Former  Advertising account planner   Vaping status: Never Used  Substance and Sexual Activity   Alcohol use: Not Currently   Drug use: Never   Sexual activity: Not on file  Other Topics Concern   Not on file  Social History Narrative   Caffeine- one cup daily, diet coke daily.  Education: Family MD (healthy weight wellness).      Social Drivers of Corporate investment banker Strain: Not on file  Food Insecurity: Not on file  Transportation Needs: Not on file  Physical Activity: Not on file  Stress: Not on file  Social Connections: Unknown (04/01/2022)   Received [facility]   Social Network    Social Network: Not on file    Family History  Problem Relation Age of Onset   Cancer Mother    Migraines Mother    Hypertension Mother    Heart failure Father    Migraines Maternal Grandmother    Migraines Other     Health Maintenance  Topic Date  Due   Hepatitis C Screening  Never done   Influenza Vaccine  06/19/2024   COVID-19 Vaccine (8 - 2025-26 season) 07/20/2024   Mammogram  11/14/2024   Colonoscopy  02/18/2031   DTaP/Tdap/Td (3 - Td or Tdap) 11/30/2032   Pneumococcal Vaccine: 50+ Years  Completed   DEXA SCAN  Completed   Zoster Vaccines- Shingrix  Completed   Meningococcal B Vaccine  Aged Out     ----------------------------------------------------------------------------------------------------------------------------------------------------------------------------------------------------------------- Physical Exam BP 119/71   Pulse 76   Ht 5' 7 (1.702 m)   Wt 139 lb 14.4 oz (63.5 kg)   SpO2  100%   BMI 21.91 kg/m   Physical Exam Constitutional:      Appearance: Normal appearance.  Eyes:     General: No scleral icterus. Neurological:     Mental Status: She is alert.  Psychiatric:        Mood and Affect: Mood normal.        Behavior: Behavior normal.     ------------------------------------------------------------------------------------------------------------------------------------------------------------------------------------------------------------------- Assessment and Plan  Migraine headache Urgent referral placed to establish with new neurologist so that she can continue on botox  injections.  She will continue emgality  with nurtec prn.   Insomnia Will initiate trial of trazodone.  She will let me know if this is not effective.    Meds ordered this encounter  Medications   traZODone (DESYREL) 50 MG tablet    Sig: Take 0.5-1 tablets (25-50 mg total) by mouth at bedtime as needed for sleep.    Dispense:  90 tablet    Refill:  1    No follow-ups on file.

## 2024-08-28 NOTE — Assessment & Plan Note (Signed)
 Will initiate trial of trazodone.  She will let me know if this is not effective.

## 2024-08-31 ENCOUNTER — Other Ambulatory Visit: Payer: Self-pay | Admitting: Family Medicine

## 2024-08-31 DIAGNOSIS — F900 Attention-deficit hyperactivity disorder, predominantly inattentive type: Secondary | ICD-10-CM

## 2024-09-02 ENCOUNTER — Other Ambulatory Visit (HOSPITAL_BASED_OUTPATIENT_CLINIC_OR_DEPARTMENT_OTHER): Payer: Self-pay

## 2024-09-02 ENCOUNTER — Other Ambulatory Visit: Payer: Self-pay

## 2024-09-02 MED ORDER — METHYLPHENIDATE HCL 10 MG PO TABS
10.0000 mg | ORAL_TABLET | Freq: Three times a day (TID) | ORAL | 0 refills | Status: AC
  Filled 2024-09-02: qty 270, 90d supply, fill #0

## 2024-09-03 ENCOUNTER — Encounter: Payer: Self-pay | Admitting: Neurology

## 2024-09-16 NOTE — Progress Notes (Unsigned)
 [name] [address] [place] [address] Phone: [phone] Subjective:   Deborah York, am serving as a scribe for Dr. Arthea Claudene.  I'm seeing this patient by the request  of:  Alvia Bring, DO  CC: Back and neck pain  YEP:[name]  Deborah York is a 68 y.o. female coming in with complaint of back and neck pain. OMT 05/01/2024. Patient states that her shoulders and neck are tight and painful. Worse on L side.   Also f/o L foot pain. Pain over medial aspect of achilles.  Medications patient has been prescribed: None  Taking:      Patient did have x-rays at last exam of the cervical spine.  These were independently visualized by me today.  Mild even potentially moderate arthritic changes from C3-C6.   Reviewed prior external information including notes and imaging from previsou exam, outside providers and external EMR if available.   As well as notes that were available from care everywhere and other healthcare systems.  Past medical history, social, surgical and family history all reviewed in electronic medical record.  No pertanent information unless stated regarding to the chief complaint.   Past Medical History:  Diagnosis Date   Complication of anesthesia    Hypertension    Migraines    PONV (postoperative nausea and vomiting)     Allergies  Allergen Reactions   Sulfa Antibiotics Rash     Review of Systems:  No headache, visual changes, nausea, vomiting, diarrhea, constipation, dizziness, abdominal pain, skin rash, fevers, chills, night sweats, weight loss, swollen lymph nodes, body aches, joint swelling, chest pain, shortness of breath, mood changes. POSITIVE muscle aches  Objective  Blood pressure 118/84, pulse 85, height 5' 7 (1.702 m), weight 136 lb (61.7 kg), SpO2 98%.   General: No apparent distress alert and oriented x3 mood and affect normal, dressed appropriately.  HEENT: Pupils equal, extraocular  movements intact  Respiratory: Patient's speak in full sentences and does not appear short of breath  Cardiovascular: No lower extremity edema, non tender, no erythema  Gait MSK:  Back  Neck exam does show some mild loss of lordosis.  Some limitation in range of motion especially with extension of the neck.  Patient does have 5 out of 5 strength though noted of the hands.  Left foot drop cuboid noted.  Osteopathic findings  C2 flexed rotated and side bent right C6 flexed rotated and side bent left T3 extended rotated and side bent right inhaled rib T9 extended rotated and side bent left L2 flexed rotated and side bent right Sacrum right on right     Assessment and Plan:  Scapular dyskinesis Continues to respond relatively well though to osteopathic manipulation.  Continue work on air cabin crew.  Patient will be moving in the near future and is doing some home repairs.  Increase activity slowly otherwise.  Follow-up again in 3 months    Nonallopathic problems  Decision today to treat with OMT was based on Physical Exam  After verbal consent patient was treated with  ME, FPR techniques in cervical, rib, thoracic, lumbar, and sacral  areas  Patient tolerated the procedure well with improvement in symptoms  Patient given exercises, stretches and lifestyle modifications  See medications in patient instructions if given  Patient will follow up in 4-8 weeks     The above documentation has been reviewed and is accurate and complete Deborah Buchan M Damont Balles, DO  Note: This dictation was prepared with Dragon dictation along with smaller phrase technology. Any transcriptional errors that result from this process are unintentional.

## 2024-09-18 ENCOUNTER — Ambulatory Visit: Admitting: Family Medicine

## 2024-09-18 ENCOUNTER — Encounter: Payer: Self-pay | Admitting: Family Medicine

## 2024-09-18 VITALS — BP 118/84 | HR 85 | Ht 67.0 in | Wt 136.0 lb

## 2024-09-18 DIAGNOSIS — M9908 Segmental and somatic dysfunction of rib cage: Secondary | ICD-10-CM

## 2024-09-18 DIAGNOSIS — M9902 Segmental and somatic dysfunction of thoracic region: Secondary | ICD-10-CM

## 2024-09-18 DIAGNOSIS — M9903 Segmental and somatic dysfunction of lumbar region: Secondary | ICD-10-CM | POA: Diagnosis not present

## 2024-09-18 DIAGNOSIS — M9904 Segmental and somatic dysfunction of sacral region: Secondary | ICD-10-CM | POA: Diagnosis not present

## 2024-09-18 DIAGNOSIS — M9901 Segmental and somatic dysfunction of cervical region: Secondary | ICD-10-CM

## 2024-09-18 DIAGNOSIS — G2589 Other specified extrapyramidal and movement disorders: Secondary | ICD-10-CM

## 2024-09-18 NOTE — Assessment & Plan Note (Signed)
 Continues to respond relatively well though to osteopathic manipulation.  Continue work on air cabin crew.  Patient will be moving in the near future and is doing some home repairs.  Increase activity slowly otherwise.  Follow-up again in 3 months

## 2024-09-23 ENCOUNTER — Other Ambulatory Visit (HOSPITAL_BASED_OUTPATIENT_CLINIC_OR_DEPARTMENT_OTHER): Payer: Self-pay

## 2024-09-23 ENCOUNTER — Other Ambulatory Visit: Payer: Self-pay

## 2024-09-25 ENCOUNTER — Other Ambulatory Visit (HOSPITAL_BASED_OUTPATIENT_CLINIC_OR_DEPARTMENT_OTHER): Payer: Self-pay

## 2024-09-25 MED ORDER — COMIRNATY 30 MCG/0.3ML IM SUSY
0.3000 mL | PREFILLED_SYRINGE | Freq: Once | INTRAMUSCULAR | 0 refills | Status: AC
  Filled 2024-09-25: qty 0.3, 1d supply, fill #0

## 2024-09-27 ENCOUNTER — Other Ambulatory Visit: Payer: Self-pay | Admitting: Family Medicine

## 2024-09-27 DIAGNOSIS — F900 Attention-deficit hyperactivity disorder, predominantly inattentive type: Secondary | ICD-10-CM

## 2024-09-28 ENCOUNTER — Other Ambulatory Visit: Payer: Self-pay

## 2024-09-28 ENCOUNTER — Other Ambulatory Visit (HOSPITAL_BASED_OUTPATIENT_CLINIC_OR_DEPARTMENT_OTHER): Payer: Self-pay

## 2024-09-28 MED ORDER — DESVENLAFAXINE SUCCINATE ER 50 MG PO TB24
50.0000 mg | ORAL_TABLET | Freq: Every day | ORAL | 3 refills | Status: AC
  Filled 2024-09-28 – 2024-12-20 (×2): qty 90, 90d supply, fill #0

## 2024-10-02 ENCOUNTER — Other Ambulatory Visit (HOSPITAL_BASED_OUTPATIENT_CLINIC_OR_DEPARTMENT_OTHER): Payer: Self-pay

## 2024-10-21 ENCOUNTER — Other Ambulatory Visit (HOSPITAL_BASED_OUTPATIENT_CLINIC_OR_DEPARTMENT_OTHER): Payer: Self-pay

## 2024-10-23 DIAGNOSIS — N393 Stress incontinence (female) (male): Secondary | ICD-10-CM | POA: Diagnosis not present

## 2024-10-27 ENCOUNTER — Other Ambulatory Visit (HOSPITAL_BASED_OUTPATIENT_CLINIC_OR_DEPARTMENT_OTHER): Payer: Self-pay

## 2024-10-30 ENCOUNTER — Other Ambulatory Visit (HOSPITAL_COMMUNITY): Payer: Self-pay

## 2024-10-30 ENCOUNTER — Other Ambulatory Visit: Payer: Self-pay

## 2024-11-02 ENCOUNTER — Other Ambulatory Visit: Payer: Self-pay
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
## 2024-11-13 ENCOUNTER — Other Ambulatory Visit (HOSPITAL_BASED_OUTPATIENT_CLINIC_OR_DEPARTMENT_OTHER): Payer: Self-pay

## 2024-11-13 ENCOUNTER — Ambulatory Visit: Admitting: Neurology

## 2024-11-13 ENCOUNTER — Encounter: Payer: Self-pay | Admitting: Neurology

## 2024-11-13 ENCOUNTER — Other Ambulatory Visit (HOSPITAL_COMMUNITY): Payer: Self-pay

## 2024-11-13 ENCOUNTER — Other Ambulatory Visit: Payer: Self-pay

## 2024-11-13 ENCOUNTER — Telehealth: Payer: Self-pay

## 2024-11-13 DIAGNOSIS — G43009 Migraine without aura, not intractable, without status migrainosus: Secondary | ICD-10-CM

## 2024-11-13 DIAGNOSIS — G43709 Chronic migraine without aura, not intractable, without status migrainosus: Secondary | ICD-10-CM | POA: Diagnosis not present

## 2024-11-13 MED ORDER — NURTEC 75 MG PO TBDP
75.0000 mg | ORAL_TABLET | Freq: Every day | ORAL | 5 refills | Status: AC | PRN
  Filled 2024-11-13: qty 16, 30d supply, fill #0
  Filled 2024-12-15 – 2024-12-24 (×2): qty 16, 16d supply, fill #0

## 2024-11-13 MED ORDER — EMGALITY 120 MG/ML ~~LOC~~ SOAJ
120.0000 mg | SUBCUTANEOUS | 5 refills | Status: AC
  Filled 2024-11-13 – 2024-11-30 (×2): qty 1, 30d supply, fill #0

## 2024-11-13 NOTE — Telephone Encounter (Signed)
 Medication Samples have been provided to the patient.  Drug name: elyxyb        Strength: 120/mg/4.18ml        Qty: 3 boxes  LOT: [id number]  Exp.Date: 09/2025  Dosing instructions: once daily  The patient has been instructed regarding the correct time, dose, and frequency of taking this medication, including desired effects and most common side effects.   Charlies KANDICE Solo 9:34 AM 11/13/2024

## 2024-11-13 NOTE — Telephone Encounter (Signed)
 SABRA

## 2024-11-13 NOTE — Patient Instructions (Signed)
" °  Restart Botox .  Continue Emgality  Take Nurtec at earliest onset of headache.  Maximum 1 tablet in 24 hours.  IF HEADACHE PERSISTS AFTER 1 HOUR, TRY ELYXYB  (1 IN 24 HOURS).  GIVE ME UPDATE Limit use of pain relievers to no more than 9 days out of the month.  These medications include acetaminophen , NSAIDs (ibuprofen /Advil /Motrin , naproxen/Aleve, triptans (Imitrex /sumatriptan ), Excedrin, and narcotics.  This will help reduce risk of rebound headaches. Be aware of common food triggers:  - Caffeine:  coffee, black tea, cola, Mt. Dew  - Chocolate  - Dairy:  aged cheeses (brie, blue, cheddar, gouda, Parmasan, provolone, romano, Swiss, etc), chocolate milk, buttermilk, sour cream, limit eggs and yogurt  - Nuts, peanut butter  - Alcohol  - Cereals/grains:  FRESH breads (fresh bagels, sourdough, doughnuts), yeast productions  - Processed/canned/aged/cured meats (pre-packaged deli meats, hotdogs)  - MSG/glutamate:  soy sauce, flavor enhancer, pickled/preserved/marinated foods  - Sweeteners:  aspartame (Equal, Nutrasweet).  Sugar and Splenda are okay  - Vegetables:  legumes (lima beans, lentils, snow peas, fava beans, pinto peans, peas, garbanzo beans), sauerkraut, onions, olives, pickles  - Fruit:  avocados, bananas, citrus fruit (orange, lemon, grapefruit), mango  - Other:  Frozen meals, macaroni and cheese Routine exercise Stay adequately hydrated (aim for 64 oz water daily) Keep headache diary Maintain proper stress management Maintain proper sleep hygiene Do not skip meals Consider supplements:  magnesium citrate 400mg  daily, riboflavin 400mg  daily, coenzyme Q10 100mg  three times daily.  "

## 2024-11-13 NOTE — Telephone Encounter (Signed)
 Update Medication dose  Medication Samples have been provided to the patient.   Drug name: elyxyb        Strength: 120/mg/4.33ml        Qty: 3 boxes                LOT: [id number]               Exp.Date: 09/2025   Dosing instructions: once daily as needed   The patient has been instructed regarding the correct time, dose, and frequency of taking this medication, including desired effects and most common side effects.    [name] 9:34 AM 11/13/2024

## 2024-11-16 ENCOUNTER — Other Ambulatory Visit (HOSPITAL_COMMUNITY): Payer: Self-pay

## 2024-11-16 ENCOUNTER — Telehealth: Payer: Self-pay

## 2024-11-16 ENCOUNTER — Telehealth: Payer: Self-pay | Admitting: Pharmacy Technician

## 2024-11-16 NOTE — Telephone Encounter (Signed)
 Patient seen on 11/13/24, Patient to start Botox  200 units every 90 days.   PA team please start PA for Botox  200 units.

## 2024-11-16 NOTE — Telephone Encounter (Signed)
 PA has been submitted, and telephone encounter has been created. Please see telephone encounter dated 12.29.25.

## 2024-11-16 NOTE — Telephone Encounter (Addendum)
 Pharmacy Patient Advocate Encounter   Received notification from Pt Calls Messages that prior authorization for BOTOX  200 is required/requested.   Insurance verification completed.   The patient is insured through [facility].   Per test claim: PA required; PA submitted to above mentioned insurance via Latent Key/confirmation #/EOC [facility] Status is pending

## 2024-11-16 NOTE — Telephone Encounter (Signed)
 Pharmacy Patient Advocate Encounter   Received notification from Pt Calls Messages that prior authorization for ELYXYB  120MG  is required/requested.   Insurance verification completed.   The patient is insured through [facility].   Per test claim: PA required; PA submitted to above mentioned insurance via Latent Key/confirmation #/EOC [facility] Status is pending

## 2024-11-17 ENCOUNTER — Other Ambulatory Visit (HOSPITAL_COMMUNITY): Payer: Self-pay

## 2024-11-17 NOTE — Telephone Encounter (Signed)
 Pharmacy Patient Advocate Encounter  Received notification from [employer] that Prior Authorization for ELYXYB  120MG  has been APPROVED from 12.29.25 to 6.28.26. Ran test claim, Copay is $162.35. This test claim was processed through [facility]- copay amounts may vary at other pharmacies due to pharmacy/plan contracts, or as the patient moves through the different stages of their insurance plan.   PA #/Case ID/Reference #: [id number]

## 2024-11-27 ENCOUNTER — Other Ambulatory Visit (HOSPITAL_BASED_OUTPATIENT_CLINIC_OR_DEPARTMENT_OTHER): Payer: Self-pay

## 2024-11-30 ENCOUNTER — Other Ambulatory Visit (HOSPITAL_COMMUNITY): Payer: Self-pay

## 2024-11-30 ENCOUNTER — Other Ambulatory Visit: Payer: Self-pay | Admitting: Family Medicine

## 2024-11-30 ENCOUNTER — Other Ambulatory Visit (HOSPITAL_BASED_OUTPATIENT_CLINIC_OR_DEPARTMENT_OTHER): Payer: Self-pay

## 2024-11-30 ENCOUNTER — Other Ambulatory Visit: Payer: Self-pay

## 2024-11-30 MED ORDER — MIRABEGRON ER 25 MG PO TB24
25.0000 mg | ORAL_TABLET | Freq: Every day | ORAL | 1 refills | Status: AC
  Filled 2024-11-30: qty 90, 90d supply, fill #0

## 2024-12-01 ENCOUNTER — Other Ambulatory Visit (HOSPITAL_COMMUNITY): Payer: Self-pay

## 2024-12-13 ENCOUNTER — Other Ambulatory Visit (HOSPITAL_COMMUNITY): Payer: Self-pay

## 2024-12-13 MED ORDER — TRIAMCINOLONE ACETONIDE 0.1 % EX OINT
TOPICAL_OINTMENT | Freq: Two times a day (BID) | CUTANEOUS | 5 refills | Status: AC
  Filled 2024-12-13: qty 30, 30d supply, fill #0

## 2024-12-15 ENCOUNTER — Other Ambulatory Visit: Payer: Self-pay

## 2024-12-15 ENCOUNTER — Telehealth: Payer: Self-pay | Admitting: Psychiatry

## 2024-12-15 ENCOUNTER — Other Ambulatory Visit (HOSPITAL_COMMUNITY): Payer: Self-pay

## 2024-12-15 ENCOUNTER — Other Ambulatory Visit: Payer: Self-pay | Admitting: Family Medicine

## 2024-12-15 MED ORDER — ONABOTULINUMTOXINA 200 UNITS IJ SOLR
INTRAMUSCULAR | 4 refills | Status: AC
  Filled 2024-12-15: qty 1, 84d supply, fill #0

## 2024-12-15 NOTE — Telephone Encounter (Signed)
 Pt called to Cancel appt due  to care somewhere else

## 2024-12-15 NOTE — Telephone Encounter (Signed)
 Pharmacy Patient Advocate Encounter  Received notification from [employer] that Prior Authorization for BOTOX  200 has been CANCELLED due to

## 2024-12-15 NOTE — Addendum Note (Signed)
 Addended by: OZELL JESUSA PARAS on: 12/15/2024 02:15 PM   Modules accepted: Orders

## 2024-12-15 NOTE — Progress Notes (Signed)
 Re-enrolled, patient transferred care to [facility]. Appointment 2.27.26, pending updated rx from new office.

## 2024-12-15 NOTE — Progress Notes (Signed)
 Specialty Pharmacy Refill Coordination Note  Deborah York is a 69 y.o. female assessed today regarding refills of clinic administered specialty medication(s) OnabotulinumtoxinA  (BOTOX )   Clinic requested Courier to Provider Office   Delivery date: 01/05/25   Verified address: [facility] [address] [place], [place] [address]   Medication will be filled on: 01/04/25  Copay: $0.00 Appointment: 2.27.26

## 2024-12-15 NOTE — Telephone Encounter (Signed)
 Patient requesting rx rf of norethindrone -ethinyl estradiol  (FEMHRT  1/5) 1-5 MG-MCG TABS tablet  Last written 05/23/225 Last OV 08/28/2024 Upcoming appt= none

## 2024-12-16 ENCOUNTER — Other Ambulatory Visit: Payer: Self-pay

## 2024-12-16 ENCOUNTER — Other Ambulatory Visit (HOSPITAL_COMMUNITY): Payer: Self-pay

## 2024-12-16 ENCOUNTER — Telehealth (HOSPITAL_COMMUNITY): Payer: Self-pay

## 2024-12-16 MED ORDER — NORETHINDRONE-ETH ESTRADIOL 1-5 MG-MCG PO TABS
1.0000 | ORAL_TABLET | Freq: Every day | ORAL | 1 refills | Status: AC
  Filled 2024-12-16: qty 84, 84d supply, fill #0

## 2024-12-16 MED ORDER — TRETINOIN 0.1 % EX CREA
1.0000 | TOPICAL_CREAM | Freq: Every evening | CUTANEOUS | 2 refills | Status: AC
  Filled 2024-12-16: qty 45, 30d supply, fill #0

## 2024-12-21 ENCOUNTER — Other Ambulatory Visit (HOSPITAL_COMMUNITY): Payer: Self-pay

## 2024-12-21 ENCOUNTER — Other Ambulatory Visit: Payer: Self-pay

## 2024-12-23 NOTE — Progress Notes (Unsigned)
" °  Deborah York [facility] [address] [place] [address] Phone: [phone] Subjective:   Deborah York, am serving as a scribe for Dr. Arthea York.  I'm seeing this patient by the request  of:  Alvia Bring, DO  CC: Back and neck pain follow-up  YEP:[name]  Deborah York is a 69 y.o. female coming in with complaint of back and neck pain. OMT on 09/18/2024. Patient states doing okay overall. No new symptoms. Mostly lower back R side and L foot bothersome.  Medications patient has been prescribed:   Taking:         Reviewed prior external information including notes and imaging from previsou exam, outside providers and external EMR if available.   As well as notes that were available from care everywhere and other healthcare systems.  Past medical history, social, surgical and family history all reviewed in electronic medical record.  No pertanent information unless stated regarding to the chief complaint.   Past Medical History:  Diagnosis Date   Complication of anesthesia    Hypertension    Migraines    PONV (postoperative nausea and vomiting)     Allergies[1]   Review of Systems:  No headache, visual changes, nausea, vomiting, diarrhea, constipation, dizziness, abdominal pain, skin rash, fevers, chills, night sweats, weight loss, swollen lymph nodes, body aches, joint swelling, chest pain, shortness of breath, mood changes. POSITIVE muscle aches  Objective  Blood pressure 132/80, pulse (!) 101, height 5' 7 (1.702 m), weight 144 lb (65.3 kg), SpO2 97%.   General: No apparent distress alert and oriented x3 mood and affect normal, dressed appropriately.  HEENT: Pupils equal, extraocular movements intact  Respiratory: Patient's speak in full sentences and does not appear short of breath  Cardiovascular: No lower extremity edema, non tender, no erythema  Gait MSK:  Back does have some loss lordosis noted.  Some tenderness to  palpation in the paraspinal musculature.  Some tightness noted with FABER.  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  Recently moved.  Do think that that has some aggravation.  Does have the migraine headaches and is going to be working with neurology and likely getting Botox  in the near future.  Continue to work on air cabin crew.  Follow-up again in 6 to 12 weeks    Nonallopathic problems  Decision today to treat with OMT was based on Physical Exam  After verbal consent patient was treated with HVLA, ME, FPR techniques in cervical, rib, thoracic, lumbar, and sacral  areas  Patient tolerated the procedure well with improvement in symptoms  Patient given exercises, stretches and lifestyle modifications  See medications in patient instructions if given  Patient will follow up in 4-8 weeks    The above documentation has been reviewed and is accurate and complete Deborah Mackie M Kyrstal Monterrosa, DO          Note: This dictation was prepared with Dragon dictation along with smaller phrase technology. Any transcriptional errors that result from this process are unintentional.            [1]  Allergies Allergen Reactions   Sulfa Antibiotics Rash   "

## 2024-12-24 ENCOUNTER — Telehealth (HOSPITAL_COMMUNITY): Payer: Self-pay | Admitting: Pharmacy Technician

## 2024-12-24 ENCOUNTER — Telehealth: Payer: Self-pay | Admitting: Pharmacy Technician

## 2024-12-24 ENCOUNTER — Other Ambulatory Visit (HOSPITAL_COMMUNITY): Payer: Self-pay

## 2024-12-24 NOTE — Telephone Encounter (Signed)
 Patient Product/process Development Scientist completed.    The patient is insured through [employer]. Patient has Medicare and is not eligible for a copay card, but may be able to apply for patient assistance or Medicare RX Payment Plan (Patient Must reach out to their plan, if eligible for payment plan), if available.    Ran test claim for Jardiance 10 mg and the current 30 day co-pay is $47.00.   This test claim was processed through [facility]- copay amounts may vary at other pharmacies due to pharmacy/plan contracts, or as the patient moves through the different stages of their insurance plan.     Reyes Sharps, CPHT Pharmacy Technician Patient Advocate Specialist Lead [facility] Patient Advocate Team Direct Number: [phone]  Fax: [phone]

## 2024-12-24 NOTE — Telephone Encounter (Signed)
 PA request has been Approved. New Encounter has been or will be created for follow up. For additional info see Pharmacy Prior Auth telephone encounter from 12/25/2023.

## 2024-12-24 NOTE — Telephone Encounter (Signed)
 Pharmacy Patient Advocate Encounter  Received notification from [facility] that Prior Authorization for Nurtec 75MG  dispersible tablets  has been APPROVED from 12/23/2024 to 12/23/2025   PA #/Case ID/Reference #: [id number] KEY: [id number]

## 2024-12-25 ENCOUNTER — Encounter: Payer: Self-pay | Admitting: Family Medicine

## 2024-12-25 ENCOUNTER — Ambulatory Visit: Admitting: Family Medicine

## 2024-12-25 VITALS — BP 132/80 | HR 101 | Ht 67.0 in | Wt 144.0 lb

## 2024-12-25 DIAGNOSIS — M9904 Segmental and somatic dysfunction of sacral region: Secondary | ICD-10-CM

## 2024-12-25 DIAGNOSIS — M9908 Segmental and somatic dysfunction of rib cage: Secondary | ICD-10-CM

## 2024-12-25 DIAGNOSIS — M9902 Segmental and somatic dysfunction of thoracic region: Secondary | ICD-10-CM

## 2024-12-25 DIAGNOSIS — M9901 Segmental and somatic dysfunction of cervical region: Secondary | ICD-10-CM

## 2024-12-25 DIAGNOSIS — G2589 Other specified extrapyramidal and movement disorders: Secondary | ICD-10-CM

## 2024-12-25 DIAGNOSIS — M9903 Segmental and somatic dysfunction of lumbar region: Secondary | ICD-10-CM

## 2024-12-25 NOTE — Assessment & Plan Note (Signed)
 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  Recently moved.  Do think that that has some aggravation.  Does have the migraine headaches and is going to be working with neurology and likely getting Botox  in the near future.  Continue to work on air cabin crew.  Follow-up again in 6 to 12 weeks

## 2024-12-25 NOTE — Patient Instructions (Signed)
Good to see you! ? ?See you again in 2-3 months ?

## 2025-01-08 ENCOUNTER — Ambulatory Visit: Admitting: Neurology

## 2025-01-15 ENCOUNTER — Ambulatory Visit: Payer: Self-pay | Admitting: Neurology

## 2025-01-25 ENCOUNTER — Ambulatory Visit: Admitting: Adult Health

## 2025-04-02 ENCOUNTER — Ambulatory Visit: Admitting: Family Medicine

## 2025-08-20 ENCOUNTER — Ambulatory Visit: Payer: Self-pay | Admitting: Neurology
